# Patient Record
Sex: Female | Born: 1940
Health system: Southern US, Community
[De-identification: ages and names within clinical notes are randomized; demographics above are authoritative.]

## PROBLEM LIST (undated history)

## (undated) DIAGNOSIS — F419 Anxiety disorder, unspecified: Secondary | ICD-10-CM

## (undated) DIAGNOSIS — I10 Essential (primary) hypertension: Secondary | ICD-10-CM

## (undated) DIAGNOSIS — I4891 Unspecified atrial fibrillation: Secondary | ICD-10-CM

## (undated) DIAGNOSIS — F329 Major depressive disorder, single episode, unspecified: Secondary | ICD-10-CM

## (undated) DIAGNOSIS — E785 Hyperlipidemia, unspecified: Secondary | ICD-10-CM

## (undated) DIAGNOSIS — Z87898 Personal history of other specified conditions: Secondary | ICD-10-CM

## (undated) DIAGNOSIS — I739 Peripheral vascular disease, unspecified: Secondary | ICD-10-CM

## (undated) DIAGNOSIS — D649 Anemia, unspecified: Secondary | ICD-10-CM

## (undated) DIAGNOSIS — F32A Depression, unspecified: Secondary | ICD-10-CM

## (undated) DIAGNOSIS — I6521 Occlusion and stenosis of right carotid artery: Secondary | ICD-10-CM

## (undated) HISTORY — PX: SKIN CANCER EXCISION: SHX779

## (undated) HISTORY — DX: Hyperlipidemia, unspecified: E78.5

## (undated) HISTORY — DX: Unspecified atrial fibrillation: I48.91

## (undated) HISTORY — DX: Essential (primary) hypertension: I10

## (undated) HISTORY — PX: ANKLE ARTHODESIS W/ ARTHROSCOPY: SUR63

## (undated) HISTORY — DX: Occlusion and stenosis of right carotid artery: I65.21

## (undated) HISTORY — DX: Depression, unspecified: F32.A

## (undated) HISTORY — DX: Major depressive disorder, single episode, unspecified: F32.9

## (undated) HISTORY — DX: Peripheral vascular disease, unspecified: I73.9

## (undated) HISTORY — DX: Personal history of other specified conditions: Z87.898

## (undated) HISTORY — DX: Anemia, unspecified: D64.9

## (undated) HISTORY — DX: Anxiety disorder, unspecified: F41.9

---

## 1980-11-02 HISTORY — PX: ABDOMINAL HYSTERECTOMY: SHX81

## 1998-03-26 ENCOUNTER — Other Ambulatory Visit: Admission: RE | Admit: 1998-03-26 | Discharge: 1998-03-26 | Payer: Self-pay | Admitting: Obstetrics and Gynecology

## 1999-12-12 ENCOUNTER — Encounter: Admission: RE | Admit: 1999-12-12 | Discharge: 1999-12-12 | Payer: Self-pay | Admitting: Internal Medicine

## 1999-12-12 ENCOUNTER — Encounter: Payer: Self-pay | Admitting: Internal Medicine

## 2000-06-18 ENCOUNTER — Encounter: Admission: RE | Admit: 2000-06-18 | Discharge: 2000-06-18 | Payer: Self-pay | Admitting: Internal Medicine

## 2000-06-18 ENCOUNTER — Encounter: Payer: Self-pay | Admitting: Internal Medicine

## 2004-12-11 ENCOUNTER — Ambulatory Visit: Payer: Self-pay | Admitting: Internal Medicine

## 2005-04-07 ENCOUNTER — Encounter: Admission: RE | Admit: 2005-04-07 | Discharge: 2005-04-07 | Payer: Self-pay | Admitting: Internal Medicine

## 2005-04-07 ENCOUNTER — Encounter: Payer: Self-pay | Admitting: Internal Medicine

## 2006-06-25 ENCOUNTER — Ambulatory Visit: Payer: Self-pay | Admitting: Internal Medicine

## 2006-07-01 ENCOUNTER — Ambulatory Visit: Payer: Self-pay | Admitting: Internal Medicine

## 2006-08-24 ENCOUNTER — Ambulatory Visit: Payer: Self-pay | Admitting: Internal Medicine

## 2006-09-10 ENCOUNTER — Ambulatory Visit: Payer: Self-pay | Admitting: Internal Medicine

## 2006-09-10 ENCOUNTER — Encounter: Payer: Self-pay | Admitting: Internal Medicine

## 2007-09-19 ENCOUNTER — Encounter: Payer: Self-pay | Admitting: Internal Medicine

## 2007-11-07 ENCOUNTER — Telehealth: Payer: Self-pay | Admitting: Internal Medicine

## 2007-11-10 ENCOUNTER — Ambulatory Visit: Payer: Self-pay | Admitting: Internal Medicine

## 2007-11-10 LAB — CONVERTED CEMR LAB
ALT: 20 units/L (ref 0–35)
AST: 22 units/L (ref 0–37)
Albumin: 3.7 g/dL (ref 3.5–5.2)
Alkaline Phosphatase: 60 units/L (ref 39–117)
BUN: 12 mg/dL (ref 6–23)
Basophils Absolute: 0 10*3/uL (ref 0.0–0.1)
Basophils Relative: 0.5 % (ref 0.0–1.0)
Bilirubin Urine: NEGATIVE
Bilirubin, Direct: 0.1 mg/dL (ref 0.0–0.3)
CO2: 29 meq/L (ref 19–32)
Calcium: 9.2 mg/dL (ref 8.4–10.5)
Chloride: 94 meq/L — ABNORMAL LOW (ref 96–112)
Cholesterol: 268 mg/dL (ref 0–200)
Creatinine, Ser: 0.9 mg/dL (ref 0.4–1.2)
Direct LDL: 126.1 mg/dL
Eosinophils Absolute: 0.1 10*3/uL (ref 0.0–0.6)
Eosinophils Relative: 1.7 % (ref 0.0–5.0)
GFR calc Af Amer: 80 mL/min
GFR calc non Af Amer: 66 mL/min
Glucose, Bld: 92 mg/dL (ref 70–99)
Glucose, Urine, Semiquant: 100
HCT: 44.2 % (ref 36.0–46.0)
HDL: 41.1 mg/dL (ref >39.0)
Hemoglobin: 15.7 g/dL — ABNORMAL HIGH (ref 12.0–15.0)
Ketones, urine, test strip: NEGATIVE
Lymphocytes Relative: 27.3 % (ref 12.0–46.0)
MCHC: 35.5 g/dL (ref 30.0–36.0)
MCV: 90.8 fL (ref 78.0–100.0)
Monocytes Absolute: 0.5 10*3/uL (ref 0.2–0.7)
Monocytes Relative: 7.3 % (ref 3.0–11.0)
Neutro Abs: 4.3 10*3/uL (ref 1.4–7.7)
Neutrophils Relative %: 63.2 % (ref 43.0–77.0)
Nitrite: NEGATIVE
Platelets: 187 10*3/uL (ref 150–400)
Potassium: 4.3 meq/L (ref 3.5–5.1)
Protein, U semiquant: NEGATIVE
RBC: 4.87 M/uL (ref 3.87–5.11)
RDW: 12.5 % (ref 11.5–14.6)
Sodium: 138 meq/L (ref 135–145)
Specific Gravity, Urine: 1.025
TSH: 2.66 microintl units/mL (ref 0.35–5.50)
Total Bilirubin: 0.7 mg/dL (ref 0.3–1.2)
Total CHOL/HDL Ratio: 6.5
Total Protein: 7 g/dL (ref 6.0–8.3)
Triglycerides: 461 mg/dL (ref 0–149)
Urobilinogen, UA: 0.2
VLDL: 92 mg/dL — ABNORMAL HIGH (ref 0–40)
WBC Urine, dipstick: NEGATIVE
WBC: 6.7 10*3/uL (ref 4.5–10.5)
pH: 5.5

## 2007-11-17 ENCOUNTER — Telehealth: Payer: Self-pay | Admitting: Internal Medicine

## 2007-12-07 ENCOUNTER — Ambulatory Visit: Payer: Self-pay | Admitting: Internal Medicine

## 2007-12-07 DIAGNOSIS — F329 Major depressive disorder, single episode, unspecified: Secondary | ICD-10-CM | POA: Insufficient documentation

## 2007-12-07 DIAGNOSIS — E785 Hyperlipidemia, unspecified: Secondary | ICD-10-CM | POA: Insufficient documentation

## 2007-12-07 DIAGNOSIS — F411 Generalized anxiety disorder: Secondary | ICD-10-CM

## 2007-12-07 DIAGNOSIS — I10 Essential (primary) hypertension: Secondary | ICD-10-CM | POA: Insufficient documentation

## 2007-12-07 DIAGNOSIS — F419 Anxiety disorder, unspecified: Secondary | ICD-10-CM | POA: Insufficient documentation

## 2008-02-21 ENCOUNTER — Ambulatory Visit: Payer: Self-pay | Admitting: Internal Medicine

## 2008-02-21 DIAGNOSIS — M25569 Pain in unspecified knee: Secondary | ICD-10-CM | POA: Insufficient documentation

## 2008-05-08 ENCOUNTER — Telehealth: Payer: Self-pay | Admitting: Internal Medicine

## 2008-06-05 ENCOUNTER — Ambulatory Visit: Payer: Self-pay | Admitting: Internal Medicine

## 2008-08-06 ENCOUNTER — Ambulatory Visit: Payer: Self-pay | Admitting: Internal Medicine

## 2009-04-30 ENCOUNTER — Ambulatory Visit: Payer: Self-pay | Admitting: Internal Medicine

## 2009-08-22 ENCOUNTER — Ambulatory Visit: Payer: Self-pay | Admitting: Internal Medicine

## 2009-08-22 DIAGNOSIS — R109 Unspecified abdominal pain: Secondary | ICD-10-CM | POA: Insufficient documentation

## 2010-02-04 ENCOUNTER — Telehealth: Payer: Self-pay | Admitting: Internal Medicine

## 2010-02-12 ENCOUNTER — Ambulatory Visit: Payer: Self-pay | Admitting: Internal Medicine

## 2010-02-12 LAB — CONVERTED CEMR LAB
BUN: 13 mg/dL (ref 6–23)
Basophils Absolute: 0 10*3/uL (ref 0.0–0.1)
Basophils Relative: 0.2 % (ref 0.0–3.0)
CO2: 31 meq/L (ref 19–32)
Calcium: 9.2 mg/dL (ref 8.4–10.5)
Chloride: 100 meq/L (ref 96–112)
Cholesterol: 251 mg/dL — ABNORMAL HIGH (ref 0–200)
Creatinine, Ser: 0.9 mg/dL (ref 0.4–1.2)
Direct LDL: 89.9 mg/dL
Eosinophils Absolute: 0.1 10*3/uL (ref 0.0–0.7)
Eosinophils Relative: 1.7 % (ref 0.0–5.0)
GFR calc non Af Amer: 65.64 mL/min (ref >60)
Glucose, Bld: 94 mg/dL (ref 70–99)
HCT: 42.8 % (ref 36.0–46.0)
HDL: 47.3 mg/dL (ref >39.00)
Hemoglobin: 14.9 g/dL (ref 12.0–15.0)
Lymphocytes Relative: 27.2 % (ref 12.0–46.0)
Lymphs Abs: 1.5 10*3/uL (ref 0.7–4.0)
MCHC: 34.8 g/dL (ref 30.0–36.0)
MCV: 91.2 fL (ref 78.0–100.0)
Monocytes Absolute: 0.5 10*3/uL (ref 0.1–1.0)
Monocytes Relative: 8.4 % (ref 3.0–12.0)
Neutro Abs: 3.6 10*3/uL (ref 1.4–7.7)
Neutrophils Relative %: 62.5 % (ref 43.0–77.0)
Platelets: 170 10*3/uL (ref 150.0–400.0)
Potassium: 4.1 meq/L (ref 3.5–5.1)
RBC: 4.69 M/uL (ref 3.87–5.11)
RDW: 13.2 % (ref 11.5–14.6)
Sodium: 139 meq/L (ref 135–145)
Total CHOL/HDL Ratio: 5
Triglycerides: 618 mg/dL — ABNORMAL HIGH (ref 0.0–149.0)
VLDL: 123.6 mg/dL — ABNORMAL HIGH (ref 0.0–40.0)
Vit D, 25-Hydroxy: 36 ng/mL (ref 30–89)
WBC: 5.7 10*3/uL (ref 4.5–10.5)

## 2010-02-20 ENCOUNTER — Telehealth: Payer: Self-pay | Admitting: Internal Medicine

## 2010-04-04 ENCOUNTER — Telehealth: Payer: Self-pay | Admitting: Internal Medicine

## 2010-04-10 ENCOUNTER — Telehealth: Payer: Self-pay | Admitting: Internal Medicine

## 2010-10-03 ENCOUNTER — Telehealth: Payer: Self-pay | Admitting: Internal Medicine

## 2010-10-08 ENCOUNTER — Ambulatory Visit: Payer: Self-pay | Admitting: Internal Medicine

## 2010-10-10 ENCOUNTER — Encounter
Admission: RE | Admit: 2010-10-10 | Discharge: 2010-10-10 | Payer: Self-pay | Source: Home / Self Care | Attending: Internal Medicine | Admitting: Internal Medicine

## 2010-12-02 NOTE — Assessment & Plan Note (Signed)
Summary: MED CK / REFILL  // RS   Vital Signs:  Patient profile:   70 year old female Weight:      189 pounds Temp:     110 degrees F oral BP sitting:   110 / 68  (right arm) Cuff size:   regular  Vitals Entered By: Duard Brady LPN (October 08, 2010 10:06 AM) CC: medication review and refills Is Patient Diabetic? No   CC:  medication review and refills.  History of Present Illness: 70 year old patient who is seen today for follow-up.  She has a history of hypertension, dyslipidemia, with a normal LDL cholesterol and also a history of anxiety, depression.  She has done well today although has not been seen in over one year.  Laboratory studies were done last brain and were unremarkable.  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  She has lost a few pounds since her last visit here.  She does have some exogenous obesity.  Her anxiety, depression, has been stable.  Allergies (verified): No Known Drug Allergies  Past History:  Past Medical History: Reviewed history from 04/30/2009 and no changes required. Anxiety Depression Hypertension Hyperlipidemia  Past Surgical History: Reviewed history from 12/07/2007 and no changes required. gravida one, para one, abortus zero Hysterectomy  1982, history of abnormal Pap  Family History: Reviewed history from 12/07/2007 and no changes required. father died age 27, MI mother died age 7.  cerebrovascular disease  One brother two sisters, one with COPD  Social History: Reviewed history from 04/30/2009 and no changes required. retired  Charity fundraiser   Review of Systems  The patient denies anorexia, fever, weight loss, weight gain, vision loss, decreased hearing, hoarseness, chest pain, syncope, dyspnea on exertion, peripheral edema, prolonged cough, headaches, hemoptysis, abdominal pain, melena, hematochezia, severe indigestion/heartburn, hematuria, incontinence, genital sores, muscle weakness,  suspicious skin lesions, transient blindness, difficulty walking, depression, unusual weight change, abnormal bleeding, enlarged lymph nodes, angioedema, and breast masses.    Physical Exam  General:  overweight-appearing.  130/70overweight-appearing.   Head:  Normocephalic and atraumatic without obvious abnormalities. No apparent alopecia or balding. Eyes:  No corneal or conjunctival inflammation noted. EOMI. Perrla. Funduscopic exam benign, without hemorrhages, exudates or papilledema. Vision grossly normal. Ears:  External ear exam shows no significant lesions or deformities.  Otoscopic examination reveals clear canals, tympanic membranes are intact bilaterally without bulging, retraction, inflammation or discharge. Hearing is grossly normal bilaterally. Mouth:  Oral mucosa and oropharynx without lesions or exudates.  Teeth in good repair. Neck:  No deformities, masses, or tenderness noted. Chest Wall:  No deformities, masses, or tenderness noted. Lungs:  Normal respiratory effort, chest expands symmetrically. Lungs are clear to auscultation, no crackles or wheezes. Heart:  Normal rate and regular rhythm. S1 and S2 normal without gallop, murmur, click, rub or other extra sounds. Abdomen:  Bowel sounds positive,abdomen soft and non-tender without masses, organomegaly or hernias noted. Msk:  No deformity or scoliosis noted of thoracic or lumbar spine.   Pulses:  R and L carotid,radial,femoral,dorsalis pedis and posterior tibial pulses are full and equal bilaterally Extremities:  No clubbing, cyanosis, edema, or deformity noted with normal full range of motion of all joints.   Skin:  Intact without suspicious lesions or rashes Cervical Nodes:  No lymphadenopathy noted Axillary Nodes:  No palpable lymphadenopathy Inguinal Nodes:  No significant adenopathy   Impression & Recommendations:  Problem # 1:  HYPERLIPIDEMIA (ICD-272.4)  Problem # 2:  HYPERTENSION (  ICD-401.9)  Her updated  medication list for this problem includes:    Bisoprolol-hydrochlorothiazide 10-6.25 Mg Tabs (Bisoprolol-hydrochlorothiazide) ..... One daily  Her updated medication list for this problem includes:    Bisoprolol-hydrochlorothiazide 10-6.25 Mg Tabs (Bisoprolol-hydrochlorothiazide) ..... One daily  Problem # 3:  ANXIETY (ICD-300.00)  Her updated medication list for this problem includes:    Clorazepate Dipotassium 3.75 Mg Tabs (Clorazepate dipotassium) ..... Use once or twice daily as needed  Her updated medication list for this problem includes:    Clorazepate Dipotassium 3.75 Mg Tabs (Clorazepate dipotassium) ..... Use once or twice daily as needed  Complete Medication List: 1)  Clorazepate Dipotassium 3.75 Mg Tabs (Clorazepate dipotassium) .... Use once or twice daily as needed 2)  Meloxicam 15 Mg Tabs (Meloxicam) .... One daily 3)  Bisoprolol-hydrochlorothiazide 10-6.25 Mg Tabs (Bisoprolol-hydrochlorothiazide) .... One daily 4)  Omeprazole 20 Mg Cpdr (Omeprazole) .... One daily 5)  Tramadol Hcl 50 Mg Tabs (Tramadol hcl) .... One every 6 hours as needed for pain  Patient Instructions: 1)  Please schedule a follow-up appointment in 4 months. 2)  Limit your Sodium (Salt). 3)  It is important that you exercise regularly at least 20 minutes 5 times a week. If you develop chest pain, have severe difficulty breathing, or feel very tired , stop exercising immediately and seek medical attention. 4)  You need to lose weight. Consider a lower calorie diet and regular exercise.  5)  Schedule your mammogram. 6)  Take calcium +Vitamin D daily. 7)  Check your Blood Pressure regularly. If it is above:  150/90 you should make an appointment. Prescriptions: TRAMADOL HCL 50 MG TABS (TRAMADOL HCL) one every 6 hours as needed for pain  #90 x 4   Entered and Authorized by:   Gordy Savers  MD   Signed by:   Gordy Savers  MD on 10/08/2010   Method used:   Print then Give to Patient   RxID:    [id number] OMEPRAZOLE 20 MG CPDR (OMEPRAZOLE) one daily  #90 x 6   Entered and Authorized by:   Gordy Savers  MD   Signed by:   Gordy Savers  MD on 10/08/2010   Method used:   Print then Give to Patient   RxID:   [id number] BISOPROLOL-HYDROCHLOROTHIAZIDE 10-6.25 MG TABS (BISOPROLOL-HYDROCHLOROTHIAZIDE) one daily  #90 Tablet x 3   Entered and Authorized by:   Gordy Savers  MD   Signed by:   Gordy Savers  MD on 10/08/2010   Method used:   Print then Give to Patient   RxID:   [id number] MELOXICAM 15 MG  TABS (MELOXICAM) one daily  #90 x 6   Entered and Authorized by:   Gordy Savers  MD   Signed by:   Gordy Savers  MD on 10/08/2010   Method used:   Print then Give to Patient   RxID:   [id number] CLORAZEPATE DIPOTASSIUM 3.75 MG  TABS (CLORAZEPATE DIPOTASSIUM) use once or twice daily as needed  #90 x 2   Entered and Authorized by:   Gordy Savers  MD   Signed by:   Gordy Savers  MD on 10/08/2010   Method used:   Print then Give to Patient   RxID:   [id number]    Orders Added: 1)  Est. Patient Level IV [32355]  Appended Document: MED CK / REFILL  // RS     Allergies: No Known Drug Allergies   Complete Medication  List: 1)  Clorazepate Dipotassium 3.75 Mg Tabs (Clorazepate dipotassium) .... Use once or twice daily as needed 2)  Meloxicam 15 Mg Tabs (Meloxicam) .... One daily 3)  Bisoprolol-hydrochlorothiazide 10-6.25 Mg Tabs (Bisoprolol-hydrochlorothiazide) .... One daily 4)  Omeprazole 20 Mg Cpdr (Omeprazole) .... One daily 5)  Tramadol Hcl 50 Mg Tabs (Tramadol hcl) .... One every 6 hours as needed for pain  Other Orders: Pneumococcal Vaccine (09811) Admin 1st Vaccine (91478)   Orders Added: 1)  Pneumococcal Vaccine [90732] 2)  Admin 1st Vaccine [29562]   Immunizations Administered:  Pneumonia Vaccine:    Vaccine Type: Pneumovax    Site: right deltoid    Mfr: Merck    Dose: 0.5  ml    Route: IM    Given by: Duard Brady LPN    Exp. Date: 02/27/2012    Lot #: [id number]    VIS given: 10/07/09 version given October 08, 2010.    Physician counseled: yes   Immunizations Administered:  Pneumonia Vaccine:    Vaccine Type: Pneumovax    Site: right deltoid    Mfr: Merck    Dose: 0.5 ml    Route: IM    Given by: Duard Brady LPN    Exp. Date: 02/27/2012    Lot #: [id number]    VIS given: 10/07/09 version given October 08, 2010.    Physician counseled: yes

## 2010-12-02 NOTE — Progress Notes (Signed)
Summary: refill clorazepate  Phone Note Refill Request Message from:  Fax from Pharmacy on April 04, 2010 1:40 PM  Refills Requested: Medication #1:  CLORAZEPATE DIPOTASSIUM 3.75 MG  TABS use once or twice daily as needed   Last Ref[facility]: [facility] [address]    [phone]   Method Requested: Fax to Local Pharmacy Initial call taken by: Duard Brady LPN,  April 04, 1609 1:41 PM    Prescriptions: CLORAZEPATE DIPOTASSIUM 3.75 MG  TABS (CLORAZEPATE DIPOTASSIUM) use once or twice daily as needed  #90 x 2   Entered and Authorized by:   Duard Brady LPN   Signed by:   Duard Brady LPN on 96/02/5408   Method used:   Historical   RxID:   [id number]  [facility]   KIK

## 2010-12-02 NOTE — Progress Notes (Signed)
Summary: shingles injection and labs  Phone Note Call from Patient   Caller: Patient Call For: Gordy Savers  MD Summary of Call: Pt. would like a shingles injection. [phone] Initial call taken by: Lynann Beaver CMA,  February 04, 2010 1:29 PM  Follow-up for Phone Call        spoke with pt - will wait till next week to see if Dr. Kirtland Bouchard approves. informed her to check with ins - cost $290 , and if they dont pay - she will have to cover the cost. Verbalized understanding. Also pt requested lab orders. KIK Follow-up by: Duard Brady LPN,  February 04, 2010 2:55 PM  Additional Follow-up for Phone Call Additional follow up Details #1::        OK Additional Follow-up by: Gordy Savers  MD,  February 10, 2010 6:56 AM    Additional Follow-up for Phone Call Additional follow up Details #2::    spoke with pt - ok to do shot - and labs - lipds, cbc , basic metabolic , vit d , transffred to scheduling to make appt. KIK Follow-up by: Duard Brady LPN,  February 10, 2010 10:14 AM

## 2010-12-02 NOTE — Progress Notes (Signed)
Summary: shingles vaccine prescription  Phone Note Call from Patient   Caller: Patient Call For: Gordy Savers  MD Summary of Call: Pt. needs a written order for the shingles vaccine to take to [facility].  Please call when she can pick up. [phone] Initial call taken by: Lynann Beaver CMA,  April 10, 2010 10:28 AM  Follow-up for Phone Call        ok Follow-up by: Gordy Savers  MD,  April 10, 2010 12:54 PM  Additional Follow-up for Phone Call Additional follow up Details #1::        rx done and pt request to mail to home address  KIK Additional Follow-up by: Duard Brady LPN,  April 10, 7370 3:06 PM

## 2010-12-02 NOTE — Progress Notes (Signed)
Summary: lab results  Phone Note Call from Patient   Caller: Patient Call For: Tammy Savers  MD Summary of Call: Pt wants lab results and to talk to [facility] about Shingles injection. [phone] Initial call taken by: Lynann Beaver CMA,  February 20, 2010 1:28 PM  Follow-up for Phone Call        D.r Amador Cunas to advise on lab results . KIK Follow-up by: Duard Brady LPN,  February 20, 2010 1:54 PM  Additional Follow-up for Phone Call Additional follow up Details #1::        all OK except elevated Cholesterol-LDL chol is low; encourage diet and weight loss; needs Vit D 1200 units daily Additional Follow-up by: Tammy Savers  MD,  February 20, 2010 2:13 PM    Additional Follow-up for Phone Call Additional follow up Details #2::    spoke with pt r/t labs and shingle vaccine - will call when avilb. KIK Follow-up by: Duard Brady LPN,  February 20, 2010 3:32 PM

## 2010-12-02 NOTE — Progress Notes (Signed)
Summary: refill clorazepate  Phone Note Refill Request Message from:  Fax from Pharmacy on October 03, 2010 12:04 PM  Refills Requested: Medication #1:  CLORAZEPATE DIPOTASSIUM 3.75 MG  TABS use once or twice daily as needed   Last Ref[facility]: [facility] [address] -    Method Requested: Fax to Local Pharmacy Initial call taken by: Duard Brady LPN,  October 03, 2010 12:05 PM    Prescriptions: CLORAZEPATE DIPOTASSIUM 3.75 MG  TABS (CLORAZEPATE DIPOTASSIUM) use once or twice daily as needed  #30 x 0   Entered by:   Duard Brady LPN   Authorized by:   Gordy Savers  MD   Signed by:   Duard Brady LPN on 04/54/0981   Method used:   Historical   RxID:   [id number]  faxed back to [facility]

## 2011-03-20 NOTE — Assessment & Plan Note (Signed)
[facility]                              [facility] OFFICE NOTE   BRITTAIN, SMITHEY                    MRN:          [id number]  DATE:07/01/2006                            DOB:          1941/09/11    A 70 year old female, seen today for a wellness exam.  She has a long  history of hypertension.  She also has a history of depression, seasonal  allergic rhinitis, and anxiety disorder.  She is doing quite well.  She has  had a prior hysterectomy, is gravida 1, para 1, abortive 0.   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  Husband  deceased from prostate cancer.   FAMILY HISTORY:  Father died at 65 of an myocardial infarction.  Mother died  in her mid-80s of a stroke.  One brother is well.  One sister died of  complications of COPD.   PHYSICAL EXAMINATION:  GENERAL:  Revealed an overweight, white female.  No  acute distress.  VITAL SIGNS:  Blood pressure is 124/80.  FUNDI, EAR, NOSE, AND THROAT:  Clear.  NECK:  No bruits or adenopathy.  CHEST:  Was clear.  BREASTS:  Negative.  CARDIOVASCULAR:  Normal heart sounds.  No murmurs.  ABDOMEN:  Obese, soft, and nontender.  No organomegaly.  PELVIC AND PAP:  Declined.  EXTREMITIES:  No edema.  Peripheral pulses intact.  NEURO:  Negative.   IMPRESSION:  1. Hypertension  2. Obesity.  3. Anxiety depression.  4. Degenerative disc disease.  5. Symptomatic left knee pain.   DISPOSITION:  Weight loss strongly encourage.  Her medical regimen will be  unchanged.  A colonoscopy will also be set up.  This has been encouraged in  the past.                                   Gordy Savers, MD   PFK/MedQ  DD:  07/01/2006  DT:  07/02/2006  Job #:  [id number]

## 2011-04-01 ENCOUNTER — Other Ambulatory Visit: Payer: Self-pay

## 2011-04-01 MED ORDER — CLORAZEPATE DIPOTASSIUM 3.75 MG PO TABS: 3.7500 mg | ORAL_TABLET | Freq: Two times a day (BID) | ORAL | Status: AC | PRN

## 2011-04-01 NOTE — Telephone Encounter (Signed)
[facility] 

## 2011-07-14 ENCOUNTER — Other Ambulatory Visit: Payer: Self-pay | Admitting: Internal Medicine

## 2011-10-21 ENCOUNTER — Other Ambulatory Visit: Payer: Self-pay | Admitting: Internal Medicine

## 2011-10-23 ENCOUNTER — Other Ambulatory Visit: Payer: Self-pay

## 2011-10-23 MED ORDER — CLORAZEPATE DIPOTASSIUM 3.75 MG PO TABS: 3.7500 mg | ORAL_TABLET | Freq: Two times a day (BID) | ORAL | Status: AC | PRN

## 2011-10-23 NOTE — Telephone Encounter (Signed)
#  90  Needs ROV prior to any more RF

## 2011-10-23 NOTE — Telephone Encounter (Signed)
[facility]

## 2011-10-23 NOTE — Telephone Encounter (Signed)
Last OV 10/08/10. Last fill date 07/13/11. No pending appts

## 2011-11-09 ENCOUNTER — Other Ambulatory Visit: Payer: Self-pay | Admitting: Internal Medicine

## 2011-12-11 ENCOUNTER — Encounter: Payer: Self-pay | Admitting: Internal Medicine

## 2011-12-14 ENCOUNTER — Encounter: Payer: Self-pay | Admitting: Internal Medicine

## 2011-12-14 ENCOUNTER — Ambulatory Visit (INDEPENDENT_AMBULATORY_CARE_PROVIDER_SITE_OTHER): Payer: Medicare Other | Admitting: Internal Medicine

## 2011-12-14 DIAGNOSIS — I10 Essential (primary) hypertension: Secondary | ICD-10-CM

## 2011-12-14 DIAGNOSIS — E785 Hyperlipidemia, unspecified: Secondary | ICD-10-CM

## 2011-12-14 DIAGNOSIS — F411 Generalized anxiety disorder: Secondary | ICD-10-CM

## 2011-12-14 LAB — COMPREHENSIVE METABOLIC PANEL WITH GFR
ALT: 17 U/L (ref 0–35)
AST: 21 U/L (ref 0–37)
Albumin: 3.7 g/dL (ref 3.5–5.2)
Alkaline Phosphatase: 41 U/L (ref 39–117)
BUN: 20 mg/dL (ref 6–23)
CO2: 29 meq/L (ref 19–32)
Calcium: 9 mg/dL (ref 8.4–10.5)
Chloride: 100 meq/L (ref 96–112)
Creatinine, Ser: 0.9 mg/dL (ref 0.4–1.2)
GFR: 67.01 mL/min (ref >60.00)
Glucose, Bld: 83 mg/dL (ref 70–99)
Potassium: 4 meq/L (ref 3.5–5.1)
Sodium: 136 meq/L (ref 135–145)
Total Bilirubin: 0.7 mg/dL (ref 0.3–1.2)
Total Protein: 7.1 g/dL (ref 6.0–8.3)

## 2011-12-14 LAB — CBC WITH DIFFERENTIAL/PLATELET
Basophils Absolute: 0 K/uL (ref 0.0–0.1)
Basophils Relative: 0.2 % (ref 0.0–3.0)
Eosinophils Absolute: 0.1 K/uL (ref 0.0–0.7)
Eosinophils Relative: 1.1 % (ref 0.0–5.0)
HCT: 43.3 % (ref 36.0–46.0)
Hemoglobin: 15 g/dL (ref 12.0–15.0)
Lymphocytes Relative: 24.5 % (ref 12.0–46.0)
Lymphs Abs: 1.7 K/uL (ref 0.7–4.0)
MCHC: 34.8 g/dL (ref 30.0–36.0)
MCV: 91.6 fl (ref 78.0–100.0)
Monocytes Absolute: 0.5 K/uL (ref 0.1–1.0)
Monocytes Relative: 7.4 % (ref 3.0–12.0)
Neutro Abs: 4.6 K/uL (ref 1.4–7.7)
Neutrophils Relative %: 66.8 % (ref 43.0–77.0)
Platelets: 174 K/uL (ref 150.0–400.0)
RBC: 4.72 Mil/uL (ref 3.87–5.11)
RDW: 13 % (ref 11.5–14.6)
WBC: 6.9 K/uL (ref 4.5–10.5)

## 2011-12-14 LAB — LIPID PANEL: Total CHOL/HDL Ratio: 5

## 2011-12-14 LAB — LDL CHOLESTEROL, DIRECT: Direct LDL: 147.2 mg/dL

## 2011-12-14 MED ORDER — OMEPRAZOLE 20 MG PO CPDR: 20.0000 mg | DELAYED_RELEASE_CAPSULE | Freq: Every day | ORAL | Status: DC

## 2011-12-14 MED ORDER — MELOXICAM 15 MG PO TABS: 15.0000 mg | ORAL_TABLET | Freq: Every day | ORAL | Status: DC

## 2011-12-14 MED ORDER — TRAMADOL HCL 50 MG PO TABS: 50.0000 mg | ORAL_TABLET | Freq: Four times a day (QID) | ORAL | Status: DC | PRN

## 2011-12-14 MED ORDER — BISOPROLOL-HYDROCHLOROTHIAZIDE 10-6.25 MG PO TABS: 1.0000 | ORAL_TABLET | Freq: Every day | ORAL | Status: DC

## 2011-12-14 NOTE — Patient Instructions (Signed)
Limit your sodium (Salt) intake  You need to lose weight.  Consider a lower calorie diet and regular exercise.  Please check your blood pressure on a regular basis.  If it is consistently greater than 150/90, please make an office appointment.    It is important that you exercise regularly, at least 20 minutes 3 to 4 times per week.  If you develop chest pain or shortness of breath seek  medical attention.  Return in 6 months for follow-up  

## 2011-12-14 NOTE — Progress Notes (Signed)
  Subjective:    Patient ID: Tammy Barron, female    DOB: [birth year], 71 y.o.   MRN: [id number]  HPI  71 year old patient who is seen today for followup of her treated hypertension. She states a watch or twice a month she feels a bit flushed and does check her blood pressure was slightly high readings. Blood pressure today is the low normal range. She has gastro-esophageal reflux disease and a history of mild anxiety disorder. She states that she feels quite well and is"stress-free"   Review of Systems  Constitutional: Negative.   HENT: Negative for hearing loss, congestion, sore throat, rhinorrhea, dental problem, sinus pressure and tinnitus.   Eyes: Negative for pain, discharge and visual disturbance.  Respiratory: Negative for cough and shortness of breath.   Cardiovascular: Negative for chest pain, palpitations and leg swelling.  Gastrointestinal: Negative for nausea, vomiting, abdominal pain, diarrhea, constipation, blood in stool and abdominal distention.  Genitourinary: Negative for dysuria, urgency, frequency, hematuria, flank pain, vaginal bleeding, vaginal discharge, difficulty urinating, vaginal pain and pelvic pain.  Musculoskeletal: Negative for joint swelling, arthralgias and gait problem.  Skin: Negative for rash.  Neurological: Negative for dizziness, syncope, speech difficulty, weakness, numbness and headaches.  Hematological: Negative for adenopathy.  Psychiatric/Behavioral: Negative for behavioral problems, dysphoric mood and agitation. The patient is not nervous/anxious.        Objective:   Physical Exam  Constitutional: She is oriented to person, place, and time. She appears well-developed and well-nourished.  HENT:  Head: Normocephalic.  Right Ear: External ear normal.  Left Ear: External ear normal.  Mouth/Throat: Oropharynx is clear and moist.  Eyes: Conjunctivae and EOM are normal. Pupils are equal, round, and reactive to light.  Neck: Normal range of  motion. Neck supple. No thyromegaly present.  Cardiovascular: Normal rate, regular rhythm, normal heart sounds and intact distal pulses.   Pulmonary/Chest: Effort normal and breath sounds normal.  Abdominal: Soft. Bowel sounds are normal. She exhibits no mass. There is no tenderness.  Musculoskeletal: Normal range of motion.  Lymphadenopathy:    She has no cervical adenopathy.  Neurological: She is alert and oriented to person, place, and time.  Skin: Skin is warm and dry. No rash noted.  Psychiatric: She has a normal mood and affect. Her behavior is normal.          Assessment & Plan:    Hypertension well controlled. Osteoarthritis stable gastroesophageal reflux disease stable We'll recheck in 6 months for an annual exam. Medications refilled We'll consider a tapering of transient

## 2011-12-22 ENCOUNTER — Other Ambulatory Visit: Payer: Self-pay

## 2011-12-22 MED ORDER — CLORAZEPATE DIPOTASSIUM 3.75 MG PO TABS: 3.7500 mg | ORAL_TABLET | Freq: Two times a day (BID) | ORAL | Status: DC | PRN

## 2012-03-04 ENCOUNTER — Other Ambulatory Visit: Payer: Self-pay

## 2012-03-04 MED ORDER — CLORAZEPATE DIPOTASSIUM 3.75 MG PO TABS: 3.7500 mg | ORAL_TABLET | Freq: Two times a day (BID) | ORAL | Status: DC | PRN

## 2012-05-30 ENCOUNTER — Other Ambulatory Visit: Payer: Self-pay

## 2012-05-30 MED ORDER — CLORAZEPATE DIPOTASSIUM 3.75 MG PO TABS: 3.7500 mg | ORAL_TABLET | Freq: Two times a day (BID) | ORAL | Status: DC | PRN

## 2012-07-11 ENCOUNTER — Other Ambulatory Visit: Payer: Self-pay

## 2012-07-11 MED ORDER — CLORAZEPATE DIPOTASSIUM 3.75 MG PO TABS: 3.7500 mg | ORAL_TABLET | Freq: Two times a day (BID) | ORAL | Status: DC | PRN

## 2012-07-11 NOTE — Telephone Encounter (Signed)
[facility] 

## 2012-08-22 ENCOUNTER — Other Ambulatory Visit: Payer: Self-pay | Admitting: Internal Medicine

## 2012-10-03 ENCOUNTER — Other Ambulatory Visit: Payer: Self-pay | Admitting: Internal Medicine

## 2012-11-23 ENCOUNTER — Other Ambulatory Visit: Payer: Self-pay | Admitting: Internal Medicine

## 2013-01-04 ENCOUNTER — Other Ambulatory Visit: Payer: Self-pay | Admitting: Internal Medicine

## 2013-02-17 ENCOUNTER — Other Ambulatory Visit: Payer: Self-pay | Admitting: Internal Medicine

## 2013-03-14 ENCOUNTER — Other Ambulatory Visit: Payer: Self-pay | Admitting: Internal Medicine

## 2013-03-29 ENCOUNTER — Other Ambulatory Visit: Payer: Self-pay | Admitting: Internal Medicine

## 2013-04-01 ENCOUNTER — Other Ambulatory Visit: Payer: Self-pay | Admitting: Internal Medicine

## 2013-05-16 ENCOUNTER — Ambulatory Visit (INDEPENDENT_AMBULATORY_CARE_PROVIDER_SITE_OTHER): Payer: Medicare Other | Admitting: Internal Medicine

## 2013-05-16 ENCOUNTER — Encounter: Payer: Self-pay | Admitting: Internal Medicine

## 2013-05-16 VITALS — BP 140/80 | HR 64 | Temp 97.7°F | Resp 20 | Ht 63.0 in | Wt 190.0 lb

## 2013-05-16 DIAGNOSIS — Z0000 Encounter for general adult medical examination without abnormal findings: Secondary | ICD-10-CM

## 2013-05-16 DIAGNOSIS — I10 Essential (primary) hypertension: Secondary | ICD-10-CM

## 2013-05-16 DIAGNOSIS — F411 Generalized anxiety disorder: Secondary | ICD-10-CM

## 2013-05-16 DIAGNOSIS — E785 Hyperlipidemia, unspecified: Secondary | ICD-10-CM

## 2013-05-16 LAB — CBC WITH DIFFERENTIAL/PLATELET
Basophils Absolute: 0 10*3/uL (ref 0.0–0.1)
Eosinophils Absolute: 0.1 10*3/uL (ref 0.0–0.7)
Eosinophils Relative: 1.6 % (ref 0.0–5.0)
Hemoglobin: 14.9 g/dL (ref 12.0–15.0)
Lymphocytes Relative: 31 % (ref 12.0–46.0)
MCHC: 34 g/dL (ref 30.0–36.0)
Monocytes Relative: 7.9 % (ref 3.0–12.0)
Neutro Abs: 4.4 10*3/uL (ref 1.4–7.7)
Platelets: 172 10*3/uL (ref 150.0–400.0)
RDW: 12.9 % (ref 11.5–14.6)
WBC: 7.4 10*3/uL (ref 4.5–10.5)

## 2013-05-16 LAB — COMPREHENSIVE METABOLIC PANEL
ALT: 15 U/L (ref 0–35)
CO2: 30 mEq/L (ref 19–32)
Calcium: 9.9 mg/dL (ref 8.4–10.5)
Chloride: 101 mEq/L (ref 96–112)
Creatinine, Ser: 0.8 mg/dL (ref 0.4–1.2)
GFR: 74.51 mL/min (ref >60.00)
Glucose, Bld: 88 mg/dL (ref 70–99)
Total Protein: 7.2 g/dL (ref 6.0–8.3)

## 2013-05-16 LAB — TSH: TSH: 2.24 u[IU]/mL (ref 0.35–5.50)

## 2013-05-16 LAB — LIPID PANEL
Cholesterol: 209 mg/dL — ABNORMAL HIGH (ref 0–200)
Total CHOL/HDL Ratio: 5
Triglycerides: 317 mg/dL — ABNORMAL HIGH (ref 0.0–149.0)

## 2013-05-16 LAB — LDL CHOLESTEROL, DIRECT: Direct LDL: 108.2 mg/dL

## 2013-05-16 MED ORDER — CLORAZEPATE DIPOTASSIUM 3.75 MG PO TABS: ORAL_TABLET | ORAL | Status: DC

## 2013-05-16 MED ORDER — OMEPRAZOLE 20 MG PO CPDR: 20.0000 mg | DELAYED_RELEASE_CAPSULE | Freq: Every day | ORAL | Status: DC

## 2013-05-16 MED ORDER — BISOPROLOL-HYDROCHLOROTHIAZIDE 10-6.25 MG PO TABS: ORAL_TABLET | ORAL | Status: DC

## 2013-05-16 NOTE — Patient Instructions (Signed)
Limit your sodium (Salt) intake    It is important that you exercise regularly, at least 20 minutes 3 to 4 times per week.  If you develop chest pain or shortness of breath seek  medical attention.  You need to lose weight.  Consider a lower calorie diet and regular exercise.  Please check your blood pressure on a regular basis.  If it is consistently greater than 150/90, please make an office appointment.  Take a calcium supplement, plus 800-1200 units of vitamin D 

## 2013-05-16 NOTE — Progress Notes (Signed)
Subjective:    Patient ID: Tammy Barron, female    DOB: [birth year], 72 y.o.   MRN: [id number]  HPI  72 year old patient who is seen today for a preventive health examination. Medical problems include hypertension mild dyslipidemia and a history of chronic anxiety. She is doing quite well today without any concerns or complaints. She did have screening colonoscopy in 2007   Allergies (verified):  No Known Drug Allergies   Past History:  Past Medical History:  Reviewed history from 04/30/2009 and no changes required.  Anxiety  Depression  Hypertension  Hyperlipidemia   Past Surgical History:  Reviewed history from 12/07/2007 and no changes required.  gravida one, para one, abortus zero  Hysterectomy 1982, history of abnormal Pap   Family History:  Reviewed history from 12/07/2007 and no changes required.  father died age 59, MI  mother died age 42. cerebrovascular disease  One brother  two sisters, one  died due to metastatic cancer. The other died status post stroke  Social History:  Reviewed history from 04/30/2009 and no changes required.  retired Charity fundraiser   Past Medical History  Diagnosis Date  . Anxiety   . Depression   . Hypertension   . Hyperlipidemia   . Hx of abnormal Pap smear     History   Social History  . Marital Status: Widowed    Spouse Name: N/A    Number of Children: N/A  . Years of Education: N/A   Occupational History  . Not on file.   Social History Main Topics  . Smoking status: Current Some Day Smoker    Types: Cigarettes  . Smokeless tobacco: Never Used  . Alcohol Use: No  . Drug Use: Not on file  . Sexually Active: Not on file   Other Topics Concern  . Not on file   Social History Narrative  . No narrative on file    Past Surgical History  Procedure Laterality Date  . Abdominal hysterectomy  1982    Family History  Problem Relation Age of Onset  . Heart disease Mother   . Heart attack Father     Allergies  Allergen  Reactions  . Latex Rash    No current outpatient prescriptions on file prior to visit.   No current facility-administered medications on file prior to visit.    BP 140/80  Pulse 64  Temp(Src) 97.7 F (36.5 C) (Oral)  Resp 20  Ht 5\' 3"  (1.6 m)  Wt 190 lb (86.183 kg)  BMI 33.67 kg/m2  SpO2 95%   1. Risk factors, based on past  M,S,F history- cardiovascular risk factors include a history of hypertension and mild dyslipidemia  2.  Physical activities:  Participates at [employer] 3 times per week  3.  Depression/mood: Mild history of depression and chronic anxiety  4.  Hearing: No deficits  5.  ADL's: Independent in all aspects of daily living  6.  Fall risk: Low 7.  Home safety: No problems identified  8.  Height weight, and visual acuity; height and weight stable no change in visual acuity  9.  Counseling: We'll regular exercise weight loss encouraged  10. Lab orders based on risk factors: Laboratory profile with lipid panel will be reviewed  11. Referral : Not appropriate at this time  12. Care plan: We'll regular exercise weight loss heart healthy diet all encouraged  13. Cognitive assessment: Alert in order with normal affect. No cognitive dysfunction.      Review of Systems  Constitutional: Negative for fever, appetite change, fatigue and unexpected weight change.  HENT: Negative for hearing loss, ear pain, nosebleeds, congestion, sore throat, mouth sores, trouble swallowing, neck stiffness, dental problem, voice change, sinus pressure and tinnitus.   Eyes: Negative for photophobia, pain, redness and visual disturbance.  Respiratory: Negative for cough, chest tightness and shortness of breath.   Cardiovascular: Negative for chest pain, palpitations and leg swelling.  Gastrointestinal: Negative for nausea, vomiting, abdominal pain, diarrhea, constipation, blood in stool, abdominal distention and rectal pain.  Genitourinary: Negative for dysuria, urgency,  frequency, hematuria, flank pain, vaginal bleeding, vaginal discharge, difficulty urinating, genital sores, vaginal pain, menstrual problem and pelvic pain.  Musculoskeletal: Negative for back pain and arthralgias.  Skin: Negative for rash.  Neurological: Negative for dizziness, syncope, speech difficulty, weakness, light-headedness, numbness and headaches.  Hematological: Negative for adenopathy. Does not bruise/bleed easily.  Psychiatric/Behavioral: Negative for suicidal ideas, behavioral problems, self-injury, dysphoric mood and agitation. The patient is nervous/anxious.        Objective:   Physical Exam  Constitutional: She is oriented to person, place, and time. She appears well-developed and well-nourished.  HENT:  Head: Normocephalic and atraumatic.  Right Ear: External ear normal.  Left Ear: External ear normal.  Mouth/Throat: Oropharynx is clear and moist.  Eyes: Conjunctivae and EOM are normal.  Neck: Normal range of motion. Neck supple. No JVD present. No thyromegaly present.  Cardiovascular: Normal rate, regular rhythm, normal heart sounds and intact distal pulses.   No murmur heard. Pedal pulses not easily palpable  Pulmonary/Chest: Effort normal and breath sounds normal. She has no wheezes. She has no rales.  Abdominal: Soft. Bowel sounds are normal. She exhibits no distension and no mass. There is no tenderness. There is no rebound and no guarding.  Musculoskeletal: Normal range of motion. She exhibits no edema and no tenderness.  Neurological: She is alert and oriented to person, place, and time. She has normal reflexes. No cranial nerve deficit. She exhibits normal muscle tone. Coordination normal.  Skin: Skin is warm and dry. No rash noted.  Psychiatric: She has a normal mood and affect. Her behavior is normal.          Assessment & Plan:  Preventive health examination Hypertension well controlled History mild dyslipidemia. We'll check a lipid panel Anxiety  disorder. Continue when necessary Tranxene  Recheck 1 year Home blood pressure monitoring encouraged We'll regular exercise and weight loss recommended

## 2013-05-18 ENCOUNTER — Telehealth: Payer: Self-pay | Admitting: Internal Medicine

## 2013-05-18 NOTE — Telephone Encounter (Signed)
Pt states she got filled the clorazepate (TRANXENE) 3.75 MG tablet. On her bottle it states it is 7.5 mg. Pt thinks they have given her a 7.5 pill. Advised pt to also call pharm because her RX states 3.75. Pt would like you to give her a call.

## 2013-05-18 NOTE — Telephone Encounter (Signed)
Spoke to pt told her need to take back to pharmacy for correct dose 3.75 mg Pt stated she called the pharmacy and they said to bring the medication back and they will give her the correct dose. Told her okay.

## 2013-09-07 ENCOUNTER — Other Ambulatory Visit: Payer: Self-pay

## 2013-10-24 ENCOUNTER — Other Ambulatory Visit: Payer: Self-pay | Admitting: Internal Medicine

## 2014-01-18 ENCOUNTER — Other Ambulatory Visit: Payer: Self-pay | Admitting: Internal Medicine

## 2014-01-29 ENCOUNTER — Other Ambulatory Visit: Payer: Self-pay | Admitting: Internal Medicine

## 2014-01-31 ENCOUNTER — Emergency Department (HOSPITAL_COMMUNITY): Payer: Medicare Other

## 2014-01-31 ENCOUNTER — Encounter (HOSPITAL_COMMUNITY): Payer: Self-pay | Admitting: Radiology

## 2014-01-31 ENCOUNTER — Inpatient Hospital Stay (HOSPITAL_COMMUNITY): Payer: Medicare Other

## 2014-01-31 ENCOUNTER — Inpatient Hospital Stay (HOSPITAL_COMMUNITY)
Admission: EM | Admit: 2014-01-31 | Discharge: 2014-02-06 | DRG: 064 | Disposition: A | Discharge status: Rehabilitation Facility | Payer: Medicare Other | Attending: Internal Medicine | Admitting: Internal Medicine

## 2014-01-31 DIAGNOSIS — E870 Hyperosmolality and hypernatremia: Secondary | ICD-10-CM | POA: Diagnosis not present

## 2014-01-31 DIAGNOSIS — H532 Diplopia: Secondary | ICD-10-CM | POA: Diagnosis present

## 2014-01-31 DIAGNOSIS — G911 Obstructive hydrocephalus: Secondary | ICD-10-CM | POA: Diagnosis not present

## 2014-01-31 DIAGNOSIS — I4891 Unspecified atrial fibrillation: Secondary | ICD-10-CM

## 2014-01-31 DIAGNOSIS — I639 Cerebral infarction, unspecified: Secondary | ICD-10-CM

## 2014-01-31 DIAGNOSIS — F17200 Nicotine dependence, unspecified, uncomplicated: Secondary | ICD-10-CM | POA: Diagnosis present

## 2014-01-31 DIAGNOSIS — I480 Paroxysmal atrial fibrillation: Secondary | ICD-10-CM | POA: Diagnosis not present

## 2014-01-31 DIAGNOSIS — F3289 Other specified depressive episodes: Secondary | ICD-10-CM

## 2014-01-31 DIAGNOSIS — E8779 Other fluid overload: Secondary | ICD-10-CM | POA: Diagnosis present

## 2014-01-31 DIAGNOSIS — I6350 Cerebral infarction due to unspecified occlusion or stenosis of unspecified cerebral artery: Principal | ICD-10-CM | POA: Diagnosis present

## 2014-01-31 DIAGNOSIS — G936 Cerebral edema: Secondary | ICD-10-CM | POA: Diagnosis present

## 2014-01-31 DIAGNOSIS — Z79899 Other long term (current) drug therapy: Secondary | ICD-10-CM

## 2014-01-31 DIAGNOSIS — I6529 Occlusion and stenosis of unspecified carotid artery: Secondary | ICD-10-CM | POA: Diagnosis present

## 2014-01-31 DIAGNOSIS — I10 Essential (primary) hypertension: Secondary | ICD-10-CM | POA: Diagnosis present

## 2014-01-31 DIAGNOSIS — R109 Unspecified abdominal pain: Secondary | ICD-10-CM

## 2014-01-31 DIAGNOSIS — E871 Hypo-osmolality and hyponatremia: Secondary | ICD-10-CM | POA: Diagnosis present

## 2014-01-31 DIAGNOSIS — M25569 Pain in unspecified knee: Secondary | ICD-10-CM

## 2014-01-31 DIAGNOSIS — Z66 Do not resuscitate: Secondary | ICD-10-CM | POA: Diagnosis present

## 2014-01-31 DIAGNOSIS — F329 Major depressive disorder, single episode, unspecified: Secondary | ICD-10-CM

## 2014-01-31 DIAGNOSIS — E876 Hypokalemia: Secondary | ICD-10-CM | POA: Diagnosis present

## 2014-01-31 DIAGNOSIS — Z6833 Body mass index (BMI) 33.0-33.9, adult: Secondary | ICD-10-CM

## 2014-01-31 DIAGNOSIS — F419 Anxiety disorder, unspecified: Secondary | ICD-10-CM | POA: Diagnosis present

## 2014-01-31 DIAGNOSIS — F411 Generalized anxiety disorder: Secondary | ICD-10-CM

## 2014-01-31 DIAGNOSIS — E669 Obesity, unspecified: Secondary | ICD-10-CM | POA: Diagnosis present

## 2014-01-31 DIAGNOSIS — E785 Hyperlipidemia, unspecified: Secondary | ICD-10-CM | POA: Diagnosis present

## 2014-01-31 LAB — CREATININE, SERUM
CREATININE: 0.66 mg/dL (ref 0.50–1.10)
GFR calc Af Amer: >90 mL/min (ref >90)
GFR calc non Af Amer: 85 mL/min — ABNORMAL LOW (ref >90)

## 2014-01-31 LAB — COMPREHENSIVE METABOLIC PANEL
ALBUMIN: 3.6 g/dL (ref 3.5–5.2)
ALK PHOS: 52 U/L (ref 39–117)
ALT: 12 U/L (ref 0–35)
AST: 20 U/L (ref 0–37)
BUN: 29 mg/dL — AB (ref 6–23)
CHLORIDE: 94 meq/L — AB (ref 96–112)
CO2: 24 mEq/L (ref 19–32)
Calcium: 9.8 mg/dL (ref 8.4–10.5)
Creatinine, Ser: 0.69 mg/dL (ref 0.50–1.10)
GFR calc Af Amer: >90 mL/min (ref >90)
GFR calc non Af Amer: 84 mL/min — ABNORMAL LOW (ref >90)
Glucose, Bld: 171 mg/dL — ABNORMAL HIGH (ref 70–99)
POTASSIUM: 3.4 meq/L — AB (ref 3.7–5.3)
Sodium: 136 mEq/L — ABNORMAL LOW (ref 137–147)
Total Bilirubin: 0.5 mg/dL (ref 0.3–1.2)
Total Protein: 7.6 g/dL (ref 6.0–8.3)

## 2014-01-31 LAB — CBC WITH DIFFERENTIAL/PLATELET
BASOS ABS: 0 10*3/uL (ref 0.0–0.1)
BASOS PCT: 0 % (ref 0–1)
Eosinophils Absolute: 0 10*3/uL (ref 0.0–0.7)
Eosinophils Relative: 0 % (ref 0–5)
HCT: 43.4 % (ref 36.0–46.0)
Hemoglobin: 15.4 g/dL — ABNORMAL HIGH (ref 12.0–15.0)
Lymphocytes Relative: 8 % — ABNORMAL LOW (ref 12–46)
Lymphs Abs: 1 10*3/uL (ref 0.7–4.0)
MCH: 31 pg (ref 26.0–34.0)
MCHC: 35.5 g/dL (ref 30.0–36.0)
MCV: 87.3 fL (ref 78.0–100.0)
Monocytes Absolute: 0.4 10*3/uL (ref 0.1–1.0)
Monocytes Relative: 4 % (ref 3–12)
NEUTROS ABS: 10.7 10*3/uL — AB (ref 1.7–7.7)
NEUTROS PCT: 88 % — AB (ref 43–77)
PLATELETS: 198 10*3/uL (ref 150–400)
RBC: 4.97 MIL/uL (ref 3.87–5.11)
RDW: 12.6 % (ref 11.5–15.5)
WBC: 12.1 10*3/uL — ABNORMAL HIGH (ref 4.0–10.5)

## 2014-01-31 LAB — URINALYSIS, ROUTINE W REFLEX MICROSCOPIC
Bilirubin Urine: NEGATIVE
Glucose, UA: NEGATIVE mg/dL
Hgb urine dipstick: NEGATIVE
KETONES UR: 40 mg/dL — AB
Leukocytes, UA: NEGATIVE
NITRITE: NEGATIVE
Protein, ur: 30 mg/dL — AB
Specific Gravity, Urine: 1.022 (ref 1.005–1.030)
Urobilinogen, UA: 0.2 mg/dL (ref 0.0–1.0)
pH: 5.5 (ref 5.0–8.0)

## 2014-01-31 LAB — CBC
HCT: 38.4 % (ref 36.0–46.0)
Hemoglobin: 13.5 g/dL (ref 12.0–15.0)
MCH: 31.2 pg (ref 26.0–34.0)
MCHC: 35.2 g/dL (ref 30.0–36.0)
MCV: 88.7 fL (ref 78.0–100.0)
PLATELETS: 181 10*3/uL (ref 150–400)
RBC: 4.33 MIL/uL (ref 3.87–5.11)
RDW: 12.8 % (ref 11.5–15.5)
WBC: 12.2 10*3/uL — ABNORMAL HIGH (ref 4.0–10.5)

## 2014-01-31 LAB — MRSA PCR SCREENING: MRSA by PCR: NEGATIVE

## 2014-01-31 LAB — URINE MICROSCOPIC-ADD ON

## 2014-01-31 LAB — I-STAT TROPONIN, ED: Troponin i, poc: 0 ng/mL (ref 0.00–0.08)

## 2014-01-31 LAB — PROTIME-INR
INR: 1.03 (ref 0.00–1.49)
Prothrombin Time: 13.3 seconds (ref 11.6–15.2)

## 2014-01-31 LAB — SODIUM: Sodium: 139 mEq/L (ref 137–147)

## 2014-01-31 LAB — APTT: APTT: 24 s (ref 24–37)

## 2014-01-31 LAB — LIPASE, BLOOD: Lipase: 16 U/L (ref 11–59)

## 2014-01-31 MED ORDER — CYCLOSPORINE 0.05 % OP EMUL
1.0000 [drp] | Freq: Two times a day (BID) | OPHTHALMIC | Status: DC
  Administered 2014-01-31 – 2014-02-02 (×4): 1 [drp] via OPHTHALMIC
  Administered 2014-02-02: 23:00:00 via OPHTHALMIC
  Administered 2014-02-03 (×2): 1 [drp] via OPHTHALMIC
  Filled 2014-01-31 (×14): qty 1

## 2014-01-31 MED ORDER — SENNOSIDES-DOCUSATE SODIUM 8.6-50 MG PO TABS
1.0000 | ORAL_TABLET | Freq: Every evening | ORAL | Status: DC | PRN
  Administered 2014-02-04 – 2014-02-05 (×2): 1 via ORAL
  Filled 2014-01-31: qty 1

## 2014-01-31 MED ORDER — ADULT MULTIVITAMIN W/MINERALS CH
1.0000 | ORAL_TABLET | Freq: Every day | ORAL | Status: DC
  Administered 2014-01-31 – 2014-02-06 (×7): 1 via ORAL
  Filled 2014-01-31 (×7): qty 1

## 2014-01-31 MED ORDER — HEPARIN SODIUM (PORCINE) 5000 UNIT/ML IJ SOLN
5000.0000 [IU] | Freq: Three times a day (TID) | INTRAMUSCULAR | Status: DC
  Administered 2014-01-31 – 2014-02-06 (×18): 5000 [IU] via SUBCUTANEOUS
  Filled 2014-01-31 (×20): qty 1

## 2014-01-31 MED ORDER — CLORAZEPATE DIPOTASSIUM 3.75 MG PO TABS: 3.7500 mg | ORAL_TABLET | Freq: Two times a day (BID) | ORAL | Status: DC | PRN

## 2014-01-31 MED ORDER — ASPIRIN 325 MG PO TABS
325.0000 mg | ORAL_TABLET | Freq: Every day | ORAL | Status: DC
  Administered 2014-01-31 – 2014-02-06 (×7): 325 mg via ORAL
  Filled 2014-01-31 (×8): qty 1

## 2014-01-31 MED ORDER — ASPIRIN 300 MG RE SUPP
300.0000 mg | Freq: Every day | RECTAL | Status: DC
  Filled 2014-01-31 (×4): qty 1

## 2014-01-31 MED ORDER — SODIUM CHLORIDE 0.9 % IV SOLN
INTRAVENOUS | Status: DC
  Administered 2014-01-31: 14:00:00 via INTRAVENOUS

## 2014-01-31 MED ORDER — ONE-DAILY MULTI VITAMINS PO TABS: 1.0000 | ORAL_TABLET | Freq: Every day | ORAL | Status: DC

## 2014-01-31 MED ORDER — GADOBENATE DIMEGLUMINE 529 MG/ML IV SOLN
18.0000 mL | Freq: Once | INTRAVENOUS | Status: AC | PRN
  Administered 2014-01-31: 18 mL via INTRAVENOUS

## 2014-01-31 MED ORDER — SODIUM CHLORIDE 3 % IV SOLN
INTRAVENOUS | Status: DC
  Administered 2014-01-31 – 2014-02-03 (×9): via INTRAVENOUS
  Administered 2014-02-03: 75 mL/h via INTRAVENOUS
  Administered 2014-02-04: 23:00:00 via INTRAVENOUS
  Administered 2014-02-04: 75 mL/h via INTRAVENOUS
  Administered 2014-02-04: 09:00:00 via INTRAVENOUS
  Administered 2014-02-05: 19 mL/h via INTRAVENOUS
  Administered 2014-02-05: 08:00:00 via INTRAVENOUS
  Filled 2014-01-31 (×29): qty 500

## 2014-01-31 NOTE — ED Notes (Signed)
Bed: WA09 Expected date:  Expected time:  Means of arrival:  Comments: Old person, NV, weakness

## 2014-01-31 NOTE — ED Notes (Signed)
Patient transported to MRI- again

## 2014-01-31 NOTE — ED Notes (Signed)
Pt c/o NVD x 4 days.  Was supposed to see her doctor 2 days ago but "was too weak".  Pt stated that she knew she would be too weak to answer the door so she told EMS that she walked to the bathroom and would open a window for them.  Pt was found "a good distance away from the window".  EMS had to crawl through the window to get to her.  No LOC.  No back pain.  Pt also c/o blurry line of vision.

## 2014-01-31 NOTE — ED Notes (Signed)
Pt attempted to use the bedpan but still could not urinate

## 2014-01-31 NOTE — ED Notes (Signed)
Pt attempted to urine on bedpan was unable then requested to be in and out before MRI. MRI requested to be called back and would come and get pt. [facility].

## 2014-01-31 NOTE — H&P (Signed)
[facility] History and Physical  Tammy Barron ZOX:[id number] DOB: 1941-02-17 DOA: 01/31/2014  Referring physician: Dr. Lynelle Doctor PCP: Rogelia Boga, MD   Chief Complaint: dizziness, generalized weakness, nausea  HPI: Tammy Barron is a 73 y.o. female  Right-handed Caucasian female with History of hypertension, anxiety and depression. States that yesterday morning she developed dizziness which persisted until today. She decided to come into the hospital secondary to persistent symptoms. She also endorsed nausea at times and generalized weakness. Denies weakness in any one extremity versus the other. Nothing she is aware of makes it better or worse.  While in the ED patient had a workup which revealed acute right PICA infarct in the cerebellum without hemorrhage. We were subsequently consulted for further evaluation and recommendations. Neurology also consulted and recommended transfer to [facility] step down.   Review of Systems:  Constitutional:  No weight loss, night sweats, Fevers, chills, fatigue.  HEENT:  No headaches, Difficulty swallowing,Tooth/dental problems,Sore throat,  No sneezing, itching, ear ache, nasal congestion, post nasal drip,  Cardio-vascular:  No chest pain, Orthopnea, PND, swelling in lower extremities, anasarca, dizziness, palpitations  GI:  No heartburn, indigestion, abdominal pain, nausea, vomiting, diarrhea, change in bowel habits, loss of appetite  Resp:  No shortness of breath with exertion or at rest. No excess mucus, no productive cough, No non-productive cough, No coughing up of blood.No change in color of mucus.No wheezing.No chest wall deformity  Skin:  no rash or lesions.  GU:  no dysuria, change in color of urine, no urgency or frequency. No flank pain.  Musculoskeletal:  No joint pain or swelling. No decreased range of motion. No back pain.  Psych:  No change in mood or affect. No depression or anxiety. No memory loss.    Past Medical History  Diagnosis Date  . Anxiety   . Depression   . Hypertension   . Hyperlipidemia   . Hx of abnormal Pap smear    Past Surgical History  Procedure Laterality Date  . Abdominal hysterectomy  1982   Social History:  reports that she has been smoking Cigarettes.  She has been smoking about 0.00 packs per day. She has never used smokeless tobacco. She reports that she does not drink alcohol. Her drug history is not on file.  Allergies  Allergen Reactions  . Latex Rash    Family History  Problem Relation Age of Onset  . Heart disease Mother   . Heart attack Father      Prior to Admission medications   Medication Sig Start Date End Date Taking? Authorizing Provider  bisoprolol-hydrochlorothiazide (ZIAC) 10-6.25 MG per tablet Take 1 tablet by mouth daily.   Yes Historical Provider, MD  clorazepate (TRANXENE) 3.75 MG tablet Take 1 tablet (3.75 mg total) by mouth 2 (two) times daily as needed for anxiety. 01/29/14  Yes Gordy Savers, MD  cycloSPORINE (RESTASIS) 0.05 % ophthalmic emulsion Place 1 drop into both eyes 2 (two) times daily.   Yes Historical Provider, MD  meloxicam (MOBIC) 15 MG tablet Take 15 mg by mouth daily.  01/04/13  Yes Gordy Savers, MD  Multiple Vitamin (MULTIVITAMIN) tablet Take 1 tablet by mouth daily.   Yes Historical Provider, MD   Physical Exam: Filed Vitals:   01/31/14 1427  BP: 161/64  Pulse: 93  Temp: 98.6 F (37 C)  Resp: 17    BP 161/64  Pulse 93  Temp(Src) 98.6 F (37 C) (Oral)  Resp 17  SpO2 97%  General:  Appears calm and comfortable Eyes: PERRL, normal lids, irises & conjunctiva, nystagmus with right eye gaze ENT: grossly normal hearing, lips & tongue Neck: no LAD, masses or thyromegaly Cardiovascular: RRR, no m/r/g. No LE edema. Telemetry: SR, no arrhythmias  Respiratory: CTA bilaterally, no w/r/r. Normal respiratory effort. Abdomen: soft, ntnd Skin: no rash or induration seen on limited  exam Musculoskeletal: grossly normal tone BUE/BLE Psychiatric: grossly normal mood and affect, speech fluent and appropriate Neurologic: Strength 5 over 5 in upper and lower extremities. Patient has nystagmus when gazing to the right. Sensation to light touch intact in upper and lower extremities.answers questions appropriately.           Labs on Admission:  Basic Metabolic Panel:  Recent Labs Lab 01/31/14 1112  NA 136*  K 3.4*  CL 94*  CO2 24  GLUCOSE 171*  BUN 29*  CREATININE 0.69  CALCIUM 9.8   Liver Function Tests:  Recent Labs Lab 01/31/14 1112  AST 20  ALT 12  ALKPHOS 52  BILITOT 0.5  PROT 7.6  ALBUMIN 3.6    Recent Labs Lab 01/31/14 1112  LIPASE 16   No results found for this basename: AMMONIA,  in the last 168 hours CBC:  Recent Labs Lab 01/31/14 1112  WBC 12.1*  NEUTROABS 10.7*  HGB 15.4*  HCT 43.4  MCV 87.3  PLT 198   Cardiac Enzymes: No results found for this basename: CKTOTAL, CKMB, CKMBINDEX, TROPONINI,  in the last 168 hours  BNP (last 3 results) No results found for this basename: PROBNP,  in the last 8760 hours CBG: No results found for this basename: GLUCAP,  in the last 168 hours  Radiological Exams on Admission: Ct Head Wo Contrast  01/31/2014   CLINICAL DATA:  Altered mental status and blurred vision ; Nausea  EXAM: CT HEAD WITHOUT CONTRAST  TECHNIQUE: Contiguous axial images were obtained from the base of the skull through the vertex without intravenous contrast. Study was obtained within 24 hr of patient's arrival at the emergency department.  COMPARISON:  None.  FINDINGS: There is edema throughout most of the right cerebellum with mass effect on the fourth ventricle. The fourth ventricle is partially effaced and displaced minimally toward the left. There is relative enlargement of the lateral and third ventricles. There is some increased attenuation in the periphery of the right cerebellum which potentially could represent subtle  hemorrhage. No other hemorrhage seen. There is a degree of underlying mild generalized atrophy. There is no extra-axial fluid or midline shift in the supratentorial region.  Elsewhere gray-white compartments appear normal with the exception of minimal small vessel disease in the centra semiovale bilaterally.  Bony calvarium appears intact.  The mastoid air cells are clear.  IMPRESSION: Extensive right cerebellar edema causing mass effect and effacement on the fourth ventricle and relative hydrocephalus of the lateral and third ventricles. The appearance in the right cerebellum is concerning for potential mass, although in acute infarct could present similarly. There may be some hemorrhage along the periphery of the right cerebellum in this lesion. No other hemorrhage seen. MR correlation with and without intravenous contrast material advised.  Elsewhere, there is mild small vessel disease in the centra semiovale bilaterally. The lateral and third ventricle are midline in location.  Critical Value/emergent results were called by telephone at the time of interpretation on 01/31/2014 at 1:09 PM to Dr. Linwood DibblesJON KNAPP , who verbally acknowledged these results.   Electronically Signed   By: Bretta BangWilliam  Woodruff M.D.  On: 01/31/2014 13:10   Mr Maxine Glenn Head Wo Contrast  01/31/2014   CLINICAL DATA:  Stroke.  EXAM: MRI HEAD WITHOUT CONTRAST  MRA HEAD WITHOUT CONTRAST  TECHNIQUE: Multiplanar, multiecho pulse sequences of the brain and surrounding structures were obtained without intravenous contrast. Angiographic images of the head were obtained using MRA technique without contrast.  COMPARISON:  CT head 01/31/2014  FINDINGS: MRI HEAD FINDINGS  Acute infarct in the right cerebellum involving the right PICA territory. This involves a large amount of the right cerebellum, essentially the entire PICA territory and also some of the AICA territory. No associated hemorrhage. There is cerebellar edema with mass-effect on the fourth ventricle  and medulla. No other acute infarct.  Generalized atrophy. Negative for hemorrhage or mass. Mild chronic microvascular ischemic change in the white matter.  Postcontrast imaging reveals normal enhancement. No enhancement of the infarct. No evidence of intracranial tumor.  MRA HEAD FINDINGS  The left vertebral artery is widely patent. Left PICA is patent. Right vertebral artery is small and irregular. There is a small amount of flow in proximal right PICA which then occludes compatible with acute infarct. The basilar is patent. Superior cerebellar and posterior cerebral arteries are patent bilaterally. Mild stenosis in the right posterior cerebral artery.  The internal carotid artery is patent bilaterally. Anterior and middle cerebral arteries are patent bilaterally without stenosis.  Negative for cerebral aneurysm.  IMPRESSION: Acute right PICA infarct in the cerebellum without hemorrhage. There is edema and mass-effect on the fourth ventricle without hydrocephalus.  There is generalized atrophy.  Negative for hemorrhage or mass.  Small and irregular distal right vertebral artery with occlusion of the right PICA consistent with acute infarct.   Electronically Signed   By: Marlan Palau M.D.   On: 01/31/2014 15:51   Mr Laqueta Jean ZO Contrast  01/31/2014   CLINICAL DATA:  Stroke.  EXAM: MRI HEAD WITHOUT CONTRAST  MRA HEAD WITHOUT CONTRAST  TECHNIQUE: Multiplanar, multiecho pulse sequences of the brain and surrounding structures were obtained without intravenous contrast. Angiographic images of the head were obtained using MRA technique without contrast.  COMPARISON:  CT head 01/31/2014  FINDINGS: MRI HEAD FINDINGS  Acute infarct in the right cerebellum involving the right PICA territory. This involves a large amount of the right cerebellum, essentially the entire PICA territory and also some of the AICA territory. No associated hemorrhage. There is cerebellar edema with mass-effect on the fourth ventricle and medulla.  No other acute infarct.  Generalized atrophy. Negative for hemorrhage or mass. Mild chronic microvascular ischemic change in the white matter.  Postcontrast imaging reveals normal enhancement. No enhancement of the infarct. No evidence of intracranial tumor.  MRA HEAD FINDINGS  The left vertebral artery is widely patent. Left PICA is patent. Right vertebral artery is small and irregular. There is a small amount of flow in proximal right PICA which then occludes compatible with acute infarct. The basilar is patent. Superior cerebellar and posterior cerebral arteries are patent bilaterally. Mild stenosis in the right posterior cerebral artery.  The internal carotid artery is patent bilaterally. Anterior and middle cerebral arteries are patent bilaterally without stenosis.  Negative for cerebral aneurysm.  IMPRESSION: Acute right PICA infarct in the cerebellum without hemorrhage. There is edema and mass-effect on the fourth ventricle without hydrocephalus.  There is generalized atrophy.  Negative for hemorrhage or mass.  Small and irregular distal right vertebral artery with occlusion of the right PICA consistent with acute infarct.   Electronically Signed  By: Marlan Palau M.D.   On: 01/31/2014 15:51    EKG: Independently reviewed. Sinus rhythm with no ST elevations or depressions  Assessment/Plan Active Problems:   Cerebellar stroke - Neurology consulted by ED physician and will evaluate patient once the patient gets to [facility] - Will admit to step down - Routine stroke order set placed please review for details - MRI reported acute right PICA infarct in the cerebellum without hemorrhage. There is edema and mass effect on the fourth ventricle without hydrocephalus  Hypertension -We'll hold blood pressure medication secondary to principal problem list above  Anxiety -We'll continue home regimen. Currently stable  DVT prophylaxis -Heparin  Code Status: DNR Family  Communication: discussed directly with patient. Disposition Plan: [facility] bed   Time spent: > 50 minutes  Penny Pia [facility] Pager [phone]

## 2014-01-31 NOTE — Progress Notes (Signed)
[facility] care [facility]. Patient is alert and oriented x's4. Patient is experiencing a headache as well as experiencing light sensitive. Patient instructed in calling for a nurse and not to get out of bed by herself.Settled into the room.

## 2014-01-31 NOTE — ED Notes (Signed)
Pt was asked for a urine sample and stated that she could not urinate right now

## 2014-01-31 NOTE — Progress Notes (Signed)
Dr. Amada JupiterKirkpatrick at pt's bedside. New orders received to transfer patient to ICU for closer monitoring. Pt called her family on her cell phone to update them. Pt transferred to 3M05 with belongings. CCMD and [facility] notified.

## 2014-01-31 NOTE — ED Provider Notes (Signed)
CSN: [id number]     Arrival date & time 01/31/14  1043 History   First MD Initiated Contact with Patient 01/31/14 1103     Chief Complaint  Patient presents with  . Nausea  . Emesis  . Diarrhea    HPI Comments: Pt has felt very weak as if she was going to pass out.  Whenever she moves around she starts to feel nauseated.  She has had a mild headache at times.  No blurred vision or double vision.  She does feel like she has had lines across her vision.  She feels very weak and dizzy.  When she called ems she did not feel like she would be able to get through the door so ems had to get to her by crawling through a window.  Patient is a 73 y.o. female presenting with vomiting, diarrhea, and near-syncope.  Emesis Associated symptoms: diarrhea   Associated symptoms: no abdominal pain   Diarrhea Quality:  Semi-solid Severity:  Mild Number of episodes:  1 or two Duration:  2 days Timing:  Intermittent Associated symptoms: vomiting   Associated symptoms: no abdominal pain and no fever   Vomiting:    Number of occurrences:  6-8   Severity:  Moderate Risk factors: no recent antibiotic use   Near Syncope This is a new problem. The current episode started 2 days ago. The problem occurs constantly. The problem has been gradually worsening. Pertinent negatives include no chest pain, no abdominal pain and no shortness of breath. The symptoms are aggravated by walking. Nothing relieves the symptoms.    Past Medical History  Diagnosis Date  . Anxiety   . Depression   . Hypertension   . Hyperlipidemia   . Hx of abnormal Pap smear    Past Surgical History  Procedure Laterality Date  . Abdominal hysterectomy  1982   Family History  Problem Relation Age of Onset  . Heart disease Mother   . Heart attack Father    History  Substance Use Topics  . Smoking status: Current Some Day Smoker    Types: Cigarettes  . Smokeless tobacco: Never Used  . Alcohol Use: No   OB History   Grav Para  Term Preterm Abortions TAB SAB Ect Mult Living   1 1             Review of Systems  Constitutional: Negative for fever.  Eyes: Negative for pain, discharge, redness and itching.  Respiratory: Negative for shortness of breath.   Cardiovascular: Positive for near-syncope. Negative for chest pain.  Gastrointestinal: Positive for nausea, vomiting and diarrhea. Negative for abdominal pain and blood in stool.       Has not eaten much in last day or two  All other systems reviewed and are negative.      Allergies  Latex  Home Medications   Current Outpatient Rx  Name  Route  Sig  Dispense  Refill  . clorazepate (TRANXENE) 3.75 MG tablet   Oral   Take 1 tablet (3.75 mg total) by mouth 2 (two) times daily as needed for anxiety.   30 tablet   5   . meloxicam (MOBIC) 15 MG tablet               . Multiple Vitamin (MULTIVITAMIN) tablet   Oral   Take 1 tablet by mouth daily.          BP 136/52  Pulse 82  Temp(Src) 98.4 F (36.9 C) (Oral)  Resp 14  SpO2 95% Physical Exam  Nursing note and vitals reviewed. Constitutional: She is oriented to person, place, and time. No distress.  HENT:  Head: Normocephalic and atraumatic.  Right Ear: External ear normal.  Left Ear: External ear normal.  Mouth/Throat: Oropharyngeal exudate (mm dry) present.  Eyes: Conjunctivae and EOM are normal. Pupils are equal, round, and reactive to light. Right eye exhibits no discharge. Left eye exhibits no discharge. No scleral icterus.  Neck: Neck supple. No tracheal deviation present.  Cardiovascular: Normal rate, regular rhythm and intact distal pulses.   Pulmonary/Chest: Effort normal and breath sounds normal. No stridor. No respiratory distress. She has no wheezes. She has no rales.  Abdominal: Soft. Bowel sounds are normal. She exhibits no distension. There is no tenderness. There is no rebound and no guarding.  Musculoskeletal: She exhibits no edema and no tenderness.  Neurological: She is  alert and oriented to person, place, and time. She has normal strength. No cranial nerve deficit (no facial droop, extraocular movements intact, no slurred speech) or sensory deficit. She exhibits normal muscle tone. She displays no seizure activity. Coordination normal.  slight pronator drift bilateral right extrem, able to hold both legs off bed for 5 seconds, sensation intact in all extremities, no visual field cuts, no left or right sided neglect, abnormal finger-nose exam with difficulty touching the tip of my finger, no nystagmus noted   Skin: Skin is warm and dry. No rash noted.  Psychiatric: She has a normal mood and affect.    ED Course  Procedures (including critical care time) CRITICAL CARE Performed by: Linwood Dibbles R Total critical care time: 45 Critical care time was exclusive of separately billable procedures and treating other patients. Critical care was necessary to treat or prevent imminent or life-threatening deterioration. Critical care was time spent personally by me on the following activities: development of treatment plan with patient and/or surrogate as well as nursing, discussions with consultants, evaluation of patient's response to treatment, examination of patient, obtaining history from patient or surrogate, ordering and performing treatments and interventions, ordering and review of laboratory studies, ordering and review of radiographic studies, pulse oximetry and re-evaluation of patient's condition.  Labs Review Labs Reviewed  CBC WITH DIFFERENTIAL - Abnormal; Notable for the following:    WBC 12.1 (*)    Hemoglobin 15.4 (*)    Neutrophils Relative % 88 (*)    Neutro Abs 10.7 (*)    Lymphocytes Relative 8 (*)    All other components within normal limits  COMPREHENSIVE METABOLIC PANEL - Abnormal; Notable for the following:    Sodium 136 (*)    Potassium 3.4 (*)    Chloride 94 (*)    Glucose, Bld 171 (*)    BUN 29 (*)    GFR calc non Af Amer 84 (*)    All  other components within normal limits  LIPASE, BLOOD  PROTIME-INR  APTT  URINALYSIS, ROUTINE W REFLEX MICROSCOPIC  I-STAT TROPOININ, ED   Imaging Review Ct Head Wo Contrast  01/31/2014   CLINICAL DATA:  Altered mental status and blurred vision ; Nausea  EXAM: CT HEAD WITHOUT CONTRAST  TECHNIQUE: Contiguous axial images were obtained from the base of the skull through the vertex without intravenous contrast. Study was obtained within 24 hr of patient's arrival at the emergency department.  COMPARISON:  None.  FINDINGS: There is edema throughout most of the right cerebellum with mass effect on the fourth ventricle. The fourth ventricle is partially effaced and displaced minimally toward  the left. There is relative enlargement of the lateral and third ventricles. There is some increased attenuation in the periphery of the right cerebellum which potentially could represent subtle hemorrhage. No other hemorrhage seen. There is a degree of underlying mild generalized atrophy. There is no extra-axial fluid or midline shift in the supratentorial region.  Elsewhere gray-white compartments appear normal with the exception of minimal small vessel disease in the centra semiovale bilaterally.  Bony calvarium appears intact.  The mastoid air cells are clear.  IMPRESSION: Extensive right cerebellar edema causing mass effect and effacement on the fourth ventricle and relative hydrocephalus of the lateral and third ventricles. The appearance in the right cerebellum is concerning for potential mass, although in acute infarct could present similarly. There may be some hemorrhage along the periphery of the right cerebellum in this lesion. No other hemorrhage seen. MR correlation with and without intravenous contrast material advised.  Elsewhere, there is mild small vessel disease in the centra semiovale bilaterally. The lateral and third ventricle are midline in location.  Critical Value/emergent results were called by  telephone at the time of interpretation on 01/31/2014 at 1:09 PM to Dr. Linwood DibblesJON Toron Bowring , who verbally acknowledged these results.   Electronically Signed   By: Bretta BangWilliam  Woodruff M.D.   On: 01/31/2014 13:10     EKG Interpretation   Date/Time:  Wednesday January 31 2014 12:39:14 EDT Ventricular Rate:  83 PR Interval:  203 QRS Duration: 81 QT Interval:  388 QTC Calculation: 456 R Axis:   44 Text Interpretation:  Sinus rhythm No previous tracing Confirmed by Nigel Ericsson   MD-J, Kaitlin Ardito ([id number]) on 01/31/2014 12:42:56 PM     1330  Discussed the CT findings with the patient including the possibility of stroke versus mass.  Pt remains alert and oriented.  GCS 15.   I consulted with Dr Roseanne RenoStewart, Neurology.  MRI has been ordered. 1618  Updated pt on findings.  SHe remains alert and oriented , gcs 15. MDM   Final diagnoses:  Cerebellar stroke     MRI showed a cerebellar stroke.  Pt is not a candidate for acute intervention unfortunately, Sx onset 48 hours ago.  D/w Dr Roseanne RenoStewart, [facility]     Celene KrasJon R Desera Graffeo, MD 01/31/14 [phone]

## 2014-01-31 NOTE — Consult Note (Addendum)
Neurology Consultation Reason for Consult: Stroke Referring Physician: Penny Pia  CC: Dizziness  History is obtained from: Patient  HPI: Tammy Barron is a 73 y.o. female with a history of hypertension, hyperlipidemia, tobacco abuse who presents with 2 days of dizziness. She states that she tried to get out of bed Tuesday morning and she had difficulty walking. She has had persistent vertigo and nausea. Today it was like a better and therefore she sought further treatment in the emergency room.  LKW: Monday 3/30 prior to bedtime.  tpa given?: no, outside of window  ROS: A 14 point ROS was performed and is negative except as noted in the HPI.  Past Medical History  Diagnosis Date  . Anxiety   . Depression   . Hypertension   . Hyperlipidemia   . Hx of abnormal Pap smear     Family History: No hx stroke  Social History: Tob: current smoker  Exam: Current vital signs: BP 168/72  Pulse 89  Temp(Src) 98.5 F (36.9 C) (Oral)  Resp 16  SpO2 96% Vital signs in last 24 hours: Temp:  [98.4 F (36.9 C)-98.6 F (37 C)] 98.5 F (36.9 C) (04/01 1734) Pulse Rate:  [81-93] 89 (04/01 1734) Resp:  [14-17] 16 (04/01 1734) BP: (136-168)/(52-72) 168/72 mmHg (04/01 1734) SpO2:  [95 %-97 %] 96 % (04/01 1734)  General: in bed, nad CV: rrr Mental Status: Patient is awake, alert, oriented to person, place, month, and situation. Gives year as 80. Immediate and remote memory are intact. Patient is able to give a clear and coherent history. No signs of aphasia or neglect Cranial Nerves: II: Visual Fields are full. Pupils are equal, round, and reactive to light.  Discs are difficult to visualize. III,IV, VI: EOMI without ptosis or diploplia.  V: Facial sensation is symmetric to temperature VII: Facial movement is symmetric.  VIII: hearing is intact to voice X: Uvula elevates symmetrically XI: Shoulder shrug is symmetric. XII: tongue is midline without atrophy or  fasciculations.  Motor: Tone is normal. Bulk is normal. 5/5 strength was present in all four extremities.  Sensory: Sensation is symmetric to light touch and temperature in the arms and legs. Deep Tendon Reflexes: 2+ and symmetric in the biceps and patellae.  Plantars: Toes are downgoing bilaterally.  Cerebellar: FNF and HKS are intact on left, dysmetria present on right.  Gait: Not tested 2/2 patient safety concerns.   I have reviewed labs in epic and the results pertinent to this consultation are: cmp- mild hypokalemia, mildly elevated BG.   I have reviewed the images obtained:MRI brain - extensive posterior fossa infarct. No HCPS.  Impression: 73 yo F with large PICA infarct. She has some pressure on her fourth ventricle, and her imaging was approximately 36 hours into her symptoms. She currently is mentating well, but is complaining of feeling much more sleepy than is typical. I think that she is at high risk of developing hydrocephalus and that she would benefit from treatment of cerebral edema to try to avoid this.   I discussed the possibility of worsening with her including possible need for neurosurgical intervention if hydrocephalus develops. I indicated that we would transfer her to an intensive care unit for close monitoring and hyperosmolar therapy.   Recommendations: 1. Transfer to ICU for hyperosmolar therapy. 2. HgbA1c, fasting lipid panel 3. Frequent neuro checks 4. Echocardiogram 5. Carotid dopplers 6. Prophylactic therapy-Antiplatelet med: Aspirin - dose 325mg  PO or 300mg  PR 7. Risk factor modification 8. Telemetry monitoring  9. PT consult, OT consult, Speech consult 10. Repeat CT tomorrow am or earlier if any change in neuro status.    This patient is critically ill and at significant risk of neurological worsening, death and care requires constant monitoring of vital signs, hemodynamics,respiratory and cardiac monitoring, neurological assessment, discussion  with family, other specialists and medical decision making of high complexity. I spent 60 minutes of neurocritical care time  in the care of  this patient.  Ritta SlotMcNeill Tilford Deaton, MD [facility] [phone]  If 7pm- 7am, please page neurology on call as listed in AMION. 01/31/2014  8:56 PM

## 2014-01-31 NOTE — ED Notes (Signed)
Patient transported to MRI 

## 2014-01-31 NOTE — Procedures (Signed)
Central Venous Catheter Insertion Procedure Note Tammy ClarksMargaret L Barron [id number] 10-Mar-1941  Procedure: Insertion of Central Venous Catheter Indications: Drug and/or fluid administration  Procedure Details Consent: Risks of procedure as well as the alternatives and risks of each were explained to the (patient/caregiver).  Consent for procedure obtained. Time Out: Verified patient identification, verified procedure, site/side was marked, verified correct patient position, special equipment/implants available, medications/allergies/relevent history reviewed, required imaging and test results available.  Performed  Maximum sterile technique was used including antiseptics, cap, gloves, gown, hand hygiene, mask and sheet. Skin prep: Chlorhexidine; local anesthetic administered A antimicrobial bonded/coated triple lumen catheter was placed in the left internal jugular vein using the Seldinger technique. Ultrasound guidance used.yes Catheter placed to 19 cm. Blood aspirated via all 3 ports and then flushed x 3. Line sutured x 2 and dressing applied.  Evaluation Blood flow good Complications: No apparent complications Patient did tolerate procedure well. Chest X-ray ordered to verify placement.  CXR: pending.  Joneen RoachPaul Hoffman, ACNP [facility] Pulmonology/Critical Care Pager [phone] or [phone]  Reviewed.  Lonia FarberZUBELEVITSKIY, Destanie Tibbetts, MD Pulmonary and Critical Care Medicine [facility] Pager: [phone]

## 2014-01-31 NOTE — Progress Notes (Signed)
I spoke with the patient regarding her DNR/DNI status. She indicates that she would not want cardiac compressions or cardioversion in the event of a cardiac arrest. She would also not want to have a breathing tube over a long period of time, but if needed for a short period in the setting of depressed mentation or need for procedure, she would want it in that case.   I am updating her DNR order to reflect those wishes.   Tammy SlotMcNeill Jahleel Stroschein, MD [facility] [phone]  If 7pm- 7am, please page neurology on call as listed in AMION.

## 2014-01-31 NOTE — ED Notes (Addendum)
TRANSFERED TO [facility] 3S11 PAULINE RN GIVEN REPORT CARELINK RN GIVEN REPORT. VOIDED BEFORE TRANSFER. CARELINK TO ASSIST

## 2014-02-01 ENCOUNTER — Inpatient Hospital Stay (HOSPITAL_COMMUNITY): Payer: Medicare Other

## 2014-02-01 DIAGNOSIS — G936 Cerebral edema: Secondary | ICD-10-CM

## 2014-02-01 DIAGNOSIS — G911 Obstructive hydrocephalus: Secondary | ICD-10-CM | POA: Diagnosis not present

## 2014-02-01 DIAGNOSIS — F411 Generalized anxiety disorder: Secondary | ICD-10-CM

## 2014-02-01 DIAGNOSIS — I517 Cardiomegaly: Secondary | ICD-10-CM

## 2014-02-01 LAB — URINALYSIS, ROUTINE W REFLEX MICROSCOPIC
Bilirubin Urine: NEGATIVE
Glucose, UA: NEGATIVE mg/dL
Hgb urine dipstick: NEGATIVE
Ketones, ur: NEGATIVE mg/dL
LEUKOCYTES UA: NEGATIVE
NITRITE: NEGATIVE
PROTEIN: NEGATIVE mg/dL
Specific Gravity, Urine: 1.021 (ref 1.005–1.030)
UROBILINOGEN UA: 0.2 mg/dL (ref 0.0–1.0)
pH: 6 (ref 5.0–8.0)

## 2014-02-01 LAB — LIPID PANEL
CHOLESTEROL: 209 mg/dL — AB (ref 0–200)
HDL: 45 mg/dL (ref >39)
LDL Cholesterol: 126 mg/dL — ABNORMAL HIGH (ref 0–99)
TRIGLYCERIDES: 189 mg/dL — AB (ref <150)
Total CHOL/HDL Ratio: 4.6 RATIO
VLDL: 38 mg/dL (ref 0–40)

## 2014-02-01 LAB — SODIUM
SODIUM: 142 meq/L (ref 137–147)
SODIUM: 143 meq/L (ref 137–147)
Sodium: 144 mEq/L (ref 137–147)
Sodium: 145 mEq/L (ref 137–147)

## 2014-02-01 LAB — HEMOGLOBIN A1C
Hgb A1c MFr Bld: 5.3 % (ref <5.7)
Mean Plasma Glucose: 105 mg/dL (ref <117)

## 2014-02-01 MED ORDER — LABETALOL HCL 5 MG/ML IV SOLN: 10.0000 mg | INTRAVENOUS | Status: DC | PRN

## 2014-02-01 MED ORDER — STROKE: EARLY STAGES OF RECOVERY BOOK
Freq: Once | Status: DC
  Filled 2014-02-01 (×2): qty 1

## 2014-02-01 MED ORDER — SENNOSIDES-DOCUSATE SODIUM 8.6-50 MG PO TABS
1.0000 | ORAL_TABLET | Freq: Two times a day (BID) | ORAL | Status: DC
  Filled 2014-02-01 (×2): qty 1

## 2014-02-01 MED ORDER — PANTOPRAZOLE SODIUM 40 MG IV SOLR: 40.0000 mg | Freq: Every day | INTRAVENOUS | Status: DC

## 2014-02-01 MED ORDER — ACETAMINOPHEN 650 MG RE SUPP: 650.0000 mg | RECTAL | Status: DC | PRN

## 2014-02-01 MED ORDER — SODIUM CHLORIDE 0.9 % IV SOLN: INTRAVENOUS | Status: DC

## 2014-02-01 MED ORDER — ACETAMINOPHEN 325 MG PO TABS: 650.0000 mg | ORAL_TABLET | ORAL | Status: DC | PRN

## 2014-02-01 MED ORDER — CLORAZEPATE DIPOTASSIUM 3.75 MG PO TABS
3.7500 mg | ORAL_TABLET | Freq: Every evening | ORAL | Status: DC | PRN
  Administered 2014-02-01 – 2014-02-05 (×2): 3.75 mg via ORAL
  Filled 2014-02-01 (×2): qty 1

## 2014-02-01 NOTE — Progress Notes (Signed)
[facility] Progress Note [place] TEAM 1 - Stepdown/ICU TEAM   Tammy ClarksMargaret L Barron [id number] DOB: Mar 30, 1941 DOA: 01/31/2014 PCP: Rogelia BogaKWIATKOWSKI,PETER FRANK, MD  Brief narrative: Tammy Barron is a 73 y.o. female presenting on 01/31/2014 with  has a past medical history of Anxiety; Depression; Hypertension; Hyperlipidemia, tobacco abuse who presents with a few days of "dizziness" which is actaully vertigo. She was found to have a right PICA infarct with mass effect and hydrocephalus. Right PICA noted to be occluded suspected by Neurology to be an embolic source.    Subjective: Sat up to eat earlier, no vertigo noted.    Assessment/Plan: Principal Problem:   Cerebellar stroke with hydrocephalus and mass effect - as mentioned above, looking for an embolic source- f/u TTE  - 3% saline, full dose ASA per Neuro   Active Problems:   HYPERLIPIDEMIA - statin started this admission    ANXIETY - PRN Tranxene    HYPERTENSION - Stroke team allowing permissive HTN??- Ziac on hold  Code Status: NO CPR per Dr Alene MiresKirkpatrick's discussion with pt- see his note  Family Communication: none at bedside Disposition Plan: Follow in ICU due   Consultants: Pearlean BrownieSethi  Procedures: none  Antibiotics: Antibiotics Given (last 72 hours)   None       DVT prophylaxis: Heparin  Objective: Filed Weights   01/31/14 1900  Weight: 86.3 kg (190 lb 4.1 oz)   Blood pressure 180/68, pulse 76, temperature 98.4 F (36.9 C), temperature source Oral, resp. rate 21, height 5' 4.5" (1.638 m), weight 86.3 kg (190 lb 4.1 oz), SpO2 95.00%.  Intake/Output Summary (Last 24 hours) at 02/01/14 1814 Last data filed at 02/01/14 1600  Gross per 24 hour  Intake 2236.25 ml  Output    750 ml  Net 1486.25 ml     Exam: General: No acute respiratory distress Lungs: Clear to auscultation bilaterally without wheezes or crackles Cardiovascular: Regular rate and rhythm without murmur gallop or rub normal S1  and S2 Abdomen: Nontender, nondistended, soft, bowel sounds positive, no rebound, no ascites, no appreciable mass Extremities: No significant cyanosis, clubbing, or edema bilateral lower extremities  Data Reviewed: Basic Metabolic Panel:  Recent Labs Lab 01/31/14 1112 01/31/14 2255 02/01/14 0450 02/01/14 0850 02/01/14 1435  NA 136* 139 142 144 145  K 3.4*  --   --   --   --   CL 94*  --   --   --   --   CO2 24  --   --   --   --   GLUCOSE 171*  --   --   --   --   BUN 29*  --   --   --   --   CREATININE 0.69 0.66  --   --   --   CALCIUM 9.8  --   --   --   --    Liver Function Tests:  Recent Labs Lab 01/31/14 1112  AST 20  ALT 12  ALKPHOS 52  BILITOT 0.5  PROT 7.6  ALBUMIN 3.6    Recent Labs Lab 01/31/14 1112  LIPASE 16   No results found for this basename: AMMONIA,  in the last 168 hours CBC:  Recent Labs Lab 01/31/14 1112 01/31/14 2255  WBC 12.1* 12.2*  NEUTROABS 10.7*  --   HGB 15.4* 13.5  HCT 43.4 38.4  MCV 87.3 88.7  PLT 198 181   Cardiac Enzymes: No results found for this basename: CKTOTAL, CKMB, CKMBINDEX, TROPONINI,  in the last 168 hours BNP (last 3 results) No results found for this basename: PROBNP,  in the last 8760 hours CBG: No results found for this basename: GLUCAP,  in the last 168 hours  Recent Results (from the past 240 hour(s))  MRSA PCR SCREENING     Status: None   Collection Time    01/31/14  7:00 PM      Result Value Ref Range Status   MRSA by PCR NEGATIVE  NEGATIVE Final   Comment:            The GeneXpert MRSA Assay (FDA     approved for NASAL specimens     only), is one component of a     comprehensive MRSA colonization     surveillance program. It is not     intended to diagnose MRSA     infection nor to guide or     monitor treatment for     MRSA infections.     Studies:  Recent x-ray studies have been reviewed in detail by the Attending Physician  Scheduled Meds:  Scheduled Meds: .  stroke: mapping our  early stages of recovery book   Does not apply Once  . aspirin  300 mg Rectal Daily   Or  . aspirin  325 mg Oral Daily  . cycloSPORINE  1 drop Both Eyes BID  . heparin  5,000 Units Subcutaneous 3 times per day  . multivitamin with minerals  1 tablet Oral Daily   Continuous Infusions: . sodium chloride (hypertonic) 75 mL/hr at 02/01/14 1600    Time spent on care of this patient: 30 min   Calvert Cantor, MD  [facility] Office  [phone] Pager - Text Page per Loretha Stapler as per below:  On-Call/Text Page:      [email or web address]  If 7PM-7AM, please contact night-coverage [email or web address] 02/01/2014, 6:14 PM   LOS: 1 day

## 2014-02-01 NOTE — Progress Notes (Signed)
Utilization review completed. Glover Capano, RN, BSN. 

## 2014-02-01 NOTE — Progress Notes (Signed)
PT Cancellation Note  Patient Details Name: Cleda ClarksMargaret L Kamel MRN: [id number] DOB: [birth year]   Cancelled Treatment:    Reason Eval/Treat Not Completed: Medical issues which prohibited therapy;Patient not medically ready. Per neuro note pt to stay in bed today. Will attempt evaluation when medically ready.    Donnamarie PoagWest, Orin Eberwein [place], [place] [phone] 02/01/2014, 9:08 AM

## 2014-02-01 NOTE — Progress Notes (Signed)
  Echocardiogram 2D Echocardiogram has been performed.  Georgian CoWILLIAMS, Tailor Westfall 02/01/2014, 5:12 PM

## 2014-02-01 NOTE — Progress Notes (Signed)
Made dr. Amada JupiterKirkpatrick aware of patient not voiding. Bladder scanned 166cc. No further orders received and will continue to monitor patient.

## 2014-02-01 NOTE — Progress Notes (Signed)
Stroke Team Progress Note  HISTORY Tammy Barron is a 73 y.o. female with a history of hypertension, hyperlipidemia, tobacco abuse who presents with 2 days of dizziness. She states that she tried to get out of bed Tuesday morning and she had difficulty walking. She has had persistent vertigo and nausea. Today 01/31/2014 it was like a better and therefore she sought further treatment in the emergency room. LKW: Monday 3/30 prior to bedtime. Patient was not administerd TPA secondary to delay in arrival. She was admitted to floor, then transferred to the the neuro ICU due to increased lethray for hypertonic saline treatment and further evaluation and treatment.  SUBJECTIVE No family is at the bedside.  Overall she feels her condition is stable. She now reports she started feeling bad on Sunday. She reports she is sleepier than usual.  OBJECTIVE Most recent Vital Signs: Filed Vitals:   02/01/14 0500 02/01/14 0600 02/01/14 0700 02/01/14 0800  BP: 184/78 169/71 189/87   Pulse: 82 70 79   Temp:    98.9 F (37.2 C)  TempSrc:    Oral  Resp: 20 19 20    Height:      Weight:      SpO2: 95% 94% 91%    CBG (last 3)  No results found for this basename: GLUCAP,  in the last 72 hours  IV Fluid Intake:   . sodium chloride Stopped (01/31/14 1419)  . sodium chloride (hypertonic) 75 mL/hr at 02/01/14 0700    MEDICATIONS  . aspirin  300 mg Rectal Daily   Or  . aspirin  325 mg Oral Daily  . cycloSPORINE  1 drop Both Eyes BID  . heparin  5,000 Units Subcutaneous 3 times per day  . multivitamin with minerals  1 tablet Oral Daily   PRN:  clorazepate, senna-docusate  Diet:  Clear Liquid thin liquids Activity:  Bedrest with HOB flat DVT Prophylaxis:  Heparin 5000 units sq tid   CLINICALLY SIGNIFICANT STUDIES Basic Metabolic Panel:   Recent Labs Lab 01/31/14 1112 01/31/14 2255 02/01/14 0450  NA 136* 139 142  K 3.4*  --   --   CL 94*  --   --   CO2 24  --   --   GLUCOSE 171*  --   --   BUN  29*  --   --   CREATININE 0.69 0.66  --   CALCIUM 9.8  --   --    Liver Function Tests:   Recent Labs Lab 01/31/14 1112  AST 20  ALT 12  ALKPHOS 52  BILITOT 0.5  PROT 7.6  ALBUMIN 3.6   CBC:   Recent Labs Lab 01/31/14 1112 01/31/14 2255  WBC 12.1* 12.2*  NEUTROABS 10.7*  --   HGB 15.4* 13.5  HCT 43.4 38.4  MCV 87.3 88.7  PLT 198 181   Coagulation:   Recent Labs Lab 01/31/14 1233  LABPROT 13.3  INR 1.03   Cardiac Enzymes: No results found for this basename: CKTOTAL, CKMB, CKMBINDEX, TROPONINI,  in the last 168 hours Urinalysis:   Recent Labs Lab 01/31/14 1444  COLORURINE YELLOW  LABSPEC 1.022  PHURINE 5.5  GLUCOSEU NEGATIVE  HGBUR NEGATIVE  BILIRUBINUR NEGATIVE  KETONESUR 40*  PROTEINUR 30*  UROBILINOGEN 0.2  NITRITE NEGATIVE  LEUKOCYTESUR NEGATIVE   Lipid Panel    Component Value Date/Time   CHOL 209* 05/16/2013 0943   TRIG 317.0* 05/16/2013 0943   HDL 40.30 05/16/2013 0943   CHOLHDL 5 05/16/2013 16100943  VLDL 63.4* 05/16/2013 0943   HgbA1C  No results found for this basename: HGBA1C    Urine Drug Screen:   No results found for this basename: labopia,  cocainscrnur,  labbenz,  amphetmu,  thcu,  labbarb    Alcohol Level: No results found for this basename: ETH,  in the last 168 hours  CT of the brain   02/01/2014    Evolving right posterior inferior cerebellar artery territory infarct with worsening posterior fossa mass effect, increasing hydrocephalus. No hemorrhagic conversion. Effaced pre pontine cistern with mass effect on the basilar artery.   01/31/2014    Extensive right cerebellar edema causing mass effect and effacement on the fourth ventricle and relative hydrocephalus of the lateral and third ventricles. The appearance in the right cerebellum is concerning for potential mass, although in acute infarct could present similarly. There may be some hemorrhage along the periphery of the right cerebellum in this lesion. No other hemorrhage seen. MR  correlation with and without intravenous contrast material advised.  Elsewhere, there is mild small vessel disease in the centra semiovale bilaterally. The lateral and third ventricle are midline in location.    MRI of the brain  01/31/2014   Acute right PICA infarct in the cerebellum without hemorrhage. There is edema and mass-effect on the fourth ventricle without hydrocephalus.  There is generalized atrophy.  Negative for hemorrhage or mass.    MRA of the brain  01/31/2014   Small and irregular distal right vertebral artery with occlusion of the right PICA consistent with acute infarct.     2D Echocardiogram    Carotid Doppler    CXR  01/31/2014    1. The tip of the left IJ approach central venous catheter overlies the left innominate vein. 2. Aortic atherosclerosis. 3. No acute cardiopulmonary process.     EKG  normal sinus rhythm. For complete results please see formal report.   Therapy Recommendations   Physical Exam   Pleasant elderly caucasian lady not in distress.Awake alert. Afebrile. Head is nontraumatic. Neck is supple without bruit. Hearing is normal. Cardiac exam no murmur or gallop. Lungs are clear to auscultation. Distal pulses are well felt. Neurological Exam :  Awake  Alert oriented x 3. Normal speech and language.eye movements full without nystagmus.fundi were not visualized. Vision acuity and fields appear normal. Hearing is normal. Palatal movements are normal. Face symmetric. Tongue midline. Normal strength, tone, reflexes and coordination. Normal sensation. Gait deferred. ASSESSMENT [name] is a 73 y.o. female presenting with dizziness, generalized weakness, nausea. Imaging confirms a right PICA infarct with cerebral cytotoxic edema and mass effect on the 4th ventricle. Increasing hydrocephalus on CT this am. Infarct felt to be embolic secondary to right PICA occlusion due to unknown etiology, suspicious for embolic source.  On no antithrombotics prior to  admission. Now on aspirin 325 mg orally every day for secondary stroke prevention. Patient with significant dizziness. Stroke work up underway.  Transferred to the ICU after admission due to increasing lethargy.   Induced hypernatremia with 3% protocol in order to decrease cerebral edema. Na 142 hypertension Hyperlipidemia, LDL 63.4, on no statin PTA, now on no statin, at goal LDL < 100 Patient reports Urinary urgency, frequency. UA neg  Hospital day # 1  TREATMENT/PLAN  Continue ICU monitoring for now  Continue aspirin 325 mg orally every day for secondary stroke prevention.  Continue 3% to goal Na of 155. Turn off once reaches 155 and do not resume.   Keep  in bed today  Increase diet  Plan TEE and loop for Monday. I have made NPO after midnight.  F/u 2D, Echo   Annie Main, MSN, RN, ANVP-BC, AGPCNP-BC [facility] Pager: [phone] 02/01/2014 8:28 AM This patient is critically ill and at significant risk of neurological worsening, death and care requires constant monitoring of vital signs, hemodynamics,respiratory and cardiac monitoring,review of multiple databases, neurological assessment, discussion with family, other specialists and medical decision making of high complexity. I spent 30 minutes of neurocritical care time  in the care of  this patient. I have personally obtained a history, examined the patient, evaluated imaging results, and formulated the assessment and plan of care. I agree with the above. Delia Heady, MD   To contact Stroke Continuity provider, please refer to [email or web address]. After hours, contact General Neurology

## 2014-02-01 NOTE — Progress Notes (Signed)
OT Cancellation Note  Patient Details Name: Tammy Barron MRN: [id number] DOB: August 01, 1941   Cancelled Treatment:    Reason Eval/Treat Not Completed: Patient not medically ready  [facility]binWARD,HILLARY Feleshia Zundel, OTR/L  [phone] 02/01/2014 02/01/2014, 12:02 PM

## 2014-02-01 NOTE — Evaluation (Signed)
Speech Language Pathology Evaluation Patient Details Name: Tammy ClarksMargaret L Barron MRN: [id number] DOB: 07-Nov-1940 Today's Date: 02/01/2014 Time: [id number] SLP Time Calculation (min): 17 min  Problem List:  Patient Active Problem List   Diagnosis Date Noted  . Obstructive hydrocephalus 02/01/2014  . ICH (intracerebral hemorrhage) 02/01/2014  . Cerebellar stroke 01/31/2014  . ABDOMINAL PAIN 08/22/2009  . KNEE PAIN, LEFT, CHRONIC 02/21/2008  . HYPERLIPIDEMIA 12/07/2007  . ANXIETY 12/07/2007  . DEPRESSION 12/07/2007  . HYPERTENSION 12/07/2007   Past Medical History:  Past Medical History  Diagnosis Date  . Anxiety   . Depression   . Hypertension   . Hyperlipidemia   . Hx of abnormal Pap smear    Past Surgical History:  Past Surgical History  Procedure Laterality Date  . Abdominal hysterectomy  1982   HPI:  73 y.o. female Right-handed Caucasian female with History of hypertension, anxiety and depression. States that yesterday morning she developed dizziness which persisted until today. CT revealed acute right PICA infarct in the cerebellum without hemorrhage.    Assessment / Plan / Recommendation Clinical Impression  Order received 4/2 for speech-cognitive assessment for pt. demonstrating intact cognition with basic/verbal information.  SLP recommends continued diagnostic evaluation of executive functions as pt. lives alone without family assist (per pt.).  SLP will continue to facilitate cognitive independence.    SLP Assessment  Patient needs continued Speech Lanaguage Pathology Services    Follow Up Recommendations   (TBA)    Frequency and Duration min 2x/week  2 weeks   Pertinent Vitals/Pain WDL   SLP Goals  SLP Goals Potential to Achieve Goals: Good  SLP Evaluation Prior Functioning  Cognitive/Linguistic Baseline: Information not available  Lives With: Alone Available Help at Discharge:  (no family to assist per pt.  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  Overall Cognitive Status: Impaired/Different from baseline Arousal/Alertness: Awake/alert (drowsy) Orientation Level: Oriented X4 Attention: Sustained Sustained Attention: Appears intact Memory:  (to assess further) Awareness: Appears intact Problem Solving: Impaired Problem Solving Impairment: Verbal basic Safety/Judgment: Appears intact    Comprehension  Auditory Comprehension Overall Auditory Comprehension: Appears within functional limits for tasks assessed Visual Recognition/Discrimination Discrimination: Not tested Reading Comprehension Reading Status:  (TBA)    Expression Expression Primary Mode of Expression: Verbal Verbal Expression Overall Verbal Expression: Appears within functional limits for tasks assessed Initiation: No impairment Level of Generative/Spontaneous Verbalization: Sentence Pragmatics: No impairment Written Expression Dominant Hand: Right Written Expression: Within Functional Limits (decreased legibility)   Oral / Motor Oral Motor/Sensory Function Overall Oral Motor/Sensory Function: Impaired Labial ROM: Reduced left Labial Symmetry: Abnormal symmetry left Lingual ROM: Within Functional Limits Lingual Symmetry: Within Functional Limits Lingual Strength: Within Functional Limits Facial ROM: Within Functional Limits Facial Symmetry: Within Functional Limits Facial Strength: Within Functional Limits Velum: Within Functional Limits Mandible: Within Functional Limits Motor Speech Overall Motor Speech: Appears within functional limits for tasks assessed Respiration: Within functional limits Phonation: Normal Resonance: Within functional limits Articulation: Within functional limitis Intelligibility: Intelligible Motor Planning: Witnin functional limits        [employer]Lisa Willis Laylana Gerwig M.Ed [employer]-SLP Pager [phone]  02/01/2014

## 2014-02-02 DIAGNOSIS — I10 Essential (primary) hypertension: Secondary | ICD-10-CM

## 2014-02-02 DIAGNOSIS — E785 Hyperlipidemia, unspecified: Secondary | ICD-10-CM

## 2014-02-02 DIAGNOSIS — I4891 Unspecified atrial fibrillation: Secondary | ICD-10-CM

## 2014-02-02 LAB — BASIC METABOLIC PANEL
BUN: 7 mg/dL (ref 6–23)
CALCIUM: 7.9 mg/dL — AB (ref 8.4–10.5)
CHLORIDE: 107 meq/L (ref 96–112)
CO2: 26 mEq/L (ref 19–32)
Creatinine, Ser: 0.5 mg/dL (ref 0.50–1.10)
GFR calc Af Amer: >90 mL/min (ref >90)
GFR calc non Af Amer: >90 mL/min (ref >90)
Glucose, Bld: 101 mg/dL — ABNORMAL HIGH (ref 70–99)
Potassium: 2.9 mEq/L — CL (ref 3.7–5.3)
Sodium: 146 mEq/L (ref 137–147)

## 2014-02-02 LAB — SODIUM
Sodium: 143 mEq/L (ref 137–147)
Sodium: 141 mEq/L (ref 137–147)
Sodium: 146 mEq/L (ref 137–147)
Sodium: 143 mEq/L (ref 137–147)

## 2014-02-02 LAB — MAGNESIUM: Magnesium: 1.7 mg/dL (ref 1.5–2.5)

## 2014-02-02 MED ORDER — GUAIFENESIN ER 600 MG PO TB12
600.0000 mg | ORAL_TABLET | Freq: Two times a day (BID) | ORAL | Status: DC
  Administered 2014-02-02 – 2014-02-06 (×9): 600 mg via ORAL
  Filled 2014-02-02 (×10): qty 1

## 2014-02-02 MED ORDER — DILTIAZEM LOAD VIA INFUSION
10.0000 mg | Freq: Once | INTRAVENOUS | Status: AC
Start: 2014-02-02 — End: 2014-02-02
  Administered 2014-02-02: 10 mg via INTRAVENOUS
  Filled 2014-02-02: qty 10

## 2014-02-02 MED ORDER — POTASSIUM CHLORIDE 10 MEQ/50ML IV SOLN
10.0000 meq | INTRAVENOUS | Status: AC
  Administered 2014-02-02 (×4): 10 meq via INTRAVENOUS
  Filled 2014-02-02 (×2): qty 50

## 2014-02-02 MED ORDER — ONDANSETRON HCL 4 MG/2ML IJ SOLN
INTRAMUSCULAR | Status: AC
  Filled 2014-02-02: qty 2

## 2014-02-02 MED ORDER — ONDANSETRON HCL 4 MG/2ML IJ SOLN: 4.0000 mg | Freq: Four times a day (QID) | INTRAMUSCULAR | Status: DC

## 2014-02-02 MED ORDER — ONDANSETRON HCL 4 MG/2ML IJ SOLN
4.0000 mg | Freq: Four times a day (QID) | INTRAMUSCULAR | Status: DC | PRN
  Administered 2014-02-03: 4 mg via INTRAVENOUS
  Filled 2014-02-02: qty 2

## 2014-02-02 MED ORDER — DILTIAZEM HCL 100 MG IV SOLR
5.0000 mg/h | INTRAVENOUS | Status: DC
  Administered 2014-02-02 – 2014-02-04 (×3): 5 mg/h via INTRAVENOUS
  Filled 2014-02-02 (×4): qty 100

## 2014-02-02 MED ORDER — SIMVASTATIN 20 MG PO TABS
20.0000 mg | ORAL_TABLET | Freq: Every day | ORAL | Status: DC
  Administered 2014-02-02: 20 mg via ORAL
  Filled 2014-02-02 (×2): qty 1

## 2014-02-02 MED ORDER — ALBUTEROL SULFATE (2.5 MG/3ML) 0.083% IN NEBU: 2.5000 mg | INHALATION_SOLUTION | RESPIRATORY_TRACT | Status: DC | PRN

## 2014-02-02 MED ORDER — FUROSEMIDE 10 MG/ML IJ SOLN
20.0000 mg | Freq: Once | INTRAMUSCULAR | Status: AC
  Administered 2014-02-02: 20 mg via INTRAVENOUS
  Filled 2014-02-02: qty 2

## 2014-02-02 NOTE — Progress Notes (Signed)
Progress Note [place] TEAM 1 - Stepdown/ICU TEAM  [name] WUJ:[id number] DOB: [birth year] DOA: 01/31/2014 PCP: Rogelia BogaKWIATKOWSKI,PETER FRANK, MD  Brief narrative: 73 y.o. female who presented on 01/31/2014 with a history of Anxiety; Depression; Hypertension; Hyperlipidemia, and tobacco abuse w/ c/o a few days of "dizziness" which was c/w vertigo. She was found to have a right PICA infarct with mass effect and hydrocephalus. Right PICA noted to be occluded - suspected by Neurology to be an embolic event.  The pt was transferred to the ICU after admission due to increasing lethargy.  Subjective: Pt is alert and conversant.  She c/o mild HA, but is troubled mostly by persisting nausea.  She also c/o cough and "rattling" in her chest.  She admits to smoking 1/2 to 1 ppd cigarettes.    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 - as mentioned above, looking for an embolic source - f/u TTE - Neuro plans for TEE Monday, with implantation of loop to r/o afib/arrythmia - 3% saline + full dose ASA per Neuro  HYPERLIPIDEMIA - statin started this admission  ANXIETY - PRN Tranxene - appears reasonably managed at this time  Tobacco abuse - prn nebs + IS + flutter valve - counseled on need to d/c tobacco   HYPERTENSION - Stroke Team allowing permissive HTN - Ziac on hold - will defer to Neuro   Code Status: NO CPR/cardioversion per Dr Alene MiresKirkpatrick's discussion with pt - see his note  Family Communication: spoke w/ son (a Armed forces operational officerdermatologist) at bedside  Disposition Plan: Follow in ICU due to hypertonic saline   Consultants: Stroke Team   Procedures: TTE - 4/2 - EF 55% to 60% - wall motion was normal - grade 1 diastolic dysfunction - no evidence of embolic source   Carotid dopplers - pending   Antibiotics: None  DVT prophylaxis: SQ Heparin  Objective: Filed Weights   01/31/14 1900  Weight: 86.3 kg (190 lb 4.1 oz)   Blood pressure 173/64, pulse 70,  temperature 97.6 F (36.4 C), temperature source Axillary, resp. rate 24, height 5' 4.5" (1.638 m), weight 86.3 kg (190 lb 4.1 oz), SpO2 96.00%.  Intake/Output Summary (Last 24 hours) at 02/02/14 1008 Last data filed at 02/02/14 0900  Gross per 24 hour  Intake 2908.33 ml  Output   1855 ml  Net 1053.33 ml   Exam: General: no acute respiratory distress - alert and conversant  Lungs: course crackles th/o all fields - no active wheeze  Cardiovascular: regular rate and rhythm without murmur gallop or rub normal S1 and S2 Abdomen: nontender, nondistended, soft, bowel sounds positive, no rebound, no ascites, no appreciable mass Extremities: no significant cyanosis, clubbing, or edema bilateral lower extremities  Data Reviewed: Basic Metabolic Panel:  Recent Labs Lab 01/31/14 1112 01/31/14 2255  02/01/14 1435 02/01/14 2120 02/02/14 0250 02/02/14 0800 02/02/14 0910  NA 136* 139  < > 145 143 143 141 146  K 3.4*  --   --   --   --   --   --   --   CL 94*  --   --   --   --   --   --   --   CO2 24  --   --   --   --   --   --   --   GLUCOSE 171*  --   --   --   --   --   --   --  BUN 29*  --   --   --   --   --   --   --   CREATININE 0.69 0.66  --   --   --   --   --   --   CALCIUM 9.8  --   --   --   --   --   --   --   < > = values in this interval not displayed.  Liver Function Tests:  Recent Labs Lab 01/31/14 1112  AST 20  ALT 12  ALKPHOS 52  BILITOT 0.5  PROT 7.6  ALBUMIN 3.6    Recent Labs Lab 01/31/14 1112  LIPASE 16   CBC:  Recent Labs Lab 01/31/14 1112 01/31/14 2255  WBC 12.1* 12.2*  NEUTROABS 10.7*  --   HGB 15.4* 13.5  HCT 43.4 38.4  MCV 87.3 88.7  PLT 198 181    Recent Results (from the past 240 hour(s))  MRSA PCR SCREENING     Status: None   Collection Time    01/31/14  7:00 PM      Result Value Ref Range Status   MRSA by PCR NEGATIVE  NEGATIVE Final   Comment:            The GeneXpert MRSA Assay (FDA     approved for NASAL specimens      only), is one component of a     comprehensive MRSA colonization     surveillance program. It is not     intended to diagnose MRSA     infection nor to guide or     monitor treatment for     MRSA infections.     Studies:  Recent x-ray studies have been reviewed in detail by the Attending Physician  Scheduled Meds:  Scheduled Meds: .  stroke: mapping our early stages of recovery book   Does not apply Once  . aspirin  300 mg Rectal Daily   Or  . aspirin  325 mg Oral Daily  . cycloSPORINE  1 drop Both Eyes BID  . furosemide  20 mg Intravenous Once  . heparin  5,000 Units Subcutaneous 3 times per day  . multivitamin with minerals  1 tablet Oral Daily   Continuous Infusions: . sodium chloride (hypertonic) 75 mL/hr at 02/02/14 0900    Time spent on care of this patient: 35 mins   Chantee Cerino T, MD  [facility] Office  [phone] Pager - Text Page per Loretha Stapler as per below:  On-Call/Text Page:      [email or web address]  If 7PM-7AM, please contact night-coverage [email or web address] 02/02/2014, 10:08 AM   LOS: 2 days

## 2014-02-02 NOTE — Evaluation (Addendum)
Physical Therapy Evaluation Patient Details Name: Tammy Barron MRN: [id number] DOB: 03/23/1941 Today's Date: 02/02/2014   History of Present Illness  Past medical history of Anxiety; Depression; Hypertension; Hyperlipidemia, tobacco abuse who presented with a few days of "dizziness". 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   Clinical Impression  Pt adm due to the above. Presents with significant decline in functional mobility secondary to deficits indicated below. Pt to benefit from skilled acute PT to address deficits and maximize mobility prior to D/C. Recommend CIR when medically ready for D/C. Pt was independent prior to admission and has good family support. Her son is a physician [facility]     Follow Up Recommendations CIR    Equipment Recommendations  Other (comment) (TBD)    Recommendations for Other Services OT consult;Rehab consult     Precautions / Restrictions Precautions Precautions: Fall;Other (comment) Restrictions Weight Bearing Restrictions: No      Mobility  Bed Mobility Overal bed mobility: Needs Assistance Bed Mobility: Supine to Sit     Supine to sit: Mod assist     General bed mobility comments: mild trunk ataxia; cues for sequencing; pt impulsive; cues for safety   Transfers Overall transfer level: Needs assistance Equipment used: 2 person hand held assist Transfers: Sit to/from Stand;Stand Pivot Transfers Sit to Stand: +2 physical assistance;Mod assist Stand pivot transfers: +2 physical assistance;Mod assist       General transfer comment: pt very dizzy with transfers; 2 person hand held (A) for safety; attempted pre-gt acitivities and pt with poor motor planning with hip flexion; shuffled pivotal steps to chair with 2 person handheld (A)   Ambulation/Gait             General Gait Details: unable to assess due to dizziness  Stairs            Wheelchair Mobility    Modified Rankin (Stroke Patients  Only)       Balance Overall balance assessment: Needs assistance Sitting-balance support: Feet supported;No upper extremity supported Sitting balance-Leahy Scale: Fair Sitting balance - Comments: eyes open but pt swaying sitting EOB   Standing balance support: During functional activity;Bilateral upper extremity supported Standing balance-Leahy Scale: Poor Standing balance comment: pt swaying with standing; requires (A) to maintain balance                              Pertinent Vitals/Pain C/o dizziness; BP elevated; under parameters.     Home Living Family/patient expects to be discharged to:: Inpatient rehab Living Arrangements: Alone Available Help at Discharge: Other (Comment)             Additional Comments: Has DME - RW, BSC, shower chair, lift chair    Prior Function Level of Independence: Independent         Comments: son present during session and is a Medical illustratorphysican      Hand Dominance   Dominant Hand: Right    Extremity/Trunk Assessment   Upper Extremity Assessment: Defer to OT evaluation           Lower Extremity Assessment: LLE deficits/detail   LLE Deficits / Details: Lt LE decreased coordination with ataxia   Cervical / Trunk Assessment: Other exceptions (trunk ataxia )  Communication   Communication: No difficulties  Cognition Arousal/Alertness: Awake/alert Behavior During Therapy: Impulsive Overall Cognitive Status: Impaired/Different from baseline Area of Impairment: Orientation;Problem solving Orientation Level: Time (year and month )  Problem Solving: Slow processing;Requires verbal cues;Requires tactile cues      General Comments General comments (skin integrity, edema, etc.): c/o pain in Lt arm where blood pressure cuff is; RN notifeid     Exercises        Assessment/Plan    PT Assessment Patient needs continued PT services  PT Diagnosis Difficulty walking;Generalized weakness   PT Problem List  Decreased balance;Decreased mobility;Decreased coordination;Decreased cognition;Decreased safety awareness;Decreased knowledge of use of DME;Decreased activity tolerance  PT Treatment Interventions DME instruction;Gait training;Functional mobility training;Therapeutic activities;Therapeutic exercise;Balance training;Neuromuscular re-education;Patient/family education   PT Goals (Current goals can be found in the Care Plan section) Acute Rehab PT Goals Patient Stated Goal: to get better and be independent PT Goal Formulation: With patient Time For Goal Achievement: 02/16/14 Potential to Achieve Goals: Good    Frequency Min 4X/week   Barriers to discharge Decreased caregiver support lived alone    Co-evaluation               End of Session Equipment Utilized During Treatment: Gait belt Activity Tolerance: Patient limited by fatigue;Other (comment) (dizziness) Patient left: in chair;with call bell/phone within reach;with family/visitor present Nurse Communication: Mobility status;Precautions;Other (comment) (WB )         Time: 4098-1191 PT Time Calculation (min): 17 min   Charges:   PT Evaluation $Initial PT Evaluation Tier I: 1 Procedure PT Treatments $Therapeutic Activity: 8-22 mins   PT G CodesDonell Sievert, [place] [phone] 02/02/2014, 1:04 PM

## 2014-02-02 NOTE — Progress Notes (Signed)
Was notified by lab of critical value - K+ of 2.9.  Dr. Noel Christmasharles Stewart was paged and said to follow the replacement protocol.  [facility]  Dr. Craige CottaSood will put in orders for IV K+ replacement. Tammy Barron C

## 2014-02-02 NOTE — Progress Notes (Signed)
Stroke Team Progress Note  HISTORY Tammy Barron is a 73 y.o. female with a history of hypertension, hyperlipidemia, and tobacco abuse who presented 01/31/2014 with 2 days of dizziness. She stated that she tried to get out of bed Tuesday morning and she had difficulty walking. She had persistent vertigo and nausea. On the day of admission it was not any better and therefore she sought further treatment in the emergency room.   LKW: Monday 3/30 prior to bedtime.  tpa given?: no, outside of window   SUBJECTIVE The patient's son who is a physician was at the bedside. The patient reported that she was still experiencing some dizziness as well as a cough. Dr. Pearlean Brownie recommended keeping the patient in the unit for now. He reviewed the MRI/MRA findings with the patient's son.  OBJECTIVE Most recent Vital Signs: Filed Vitals:   02/02/14 0530 02/02/14 0600 02/02/14 0645 02/02/14 0740  BP: 178/71 184/77 173/64   Pulse: 76 75 70   Temp:    97.6 F (36.4 C)  TempSrc:    Axillary  Resp:   24   Height:      Weight:      SpO2: 97% 97% 96%    CBG (last 3)  No results found for this basename: GLUCAP,  in the last 72 hours  IV Fluid Intake:   . sodium chloride (hypertonic) 125 mL/hr at 02/02/14 4098    MEDICATIONS  .  stroke: mapping our early stages of recovery book   Does not apply Once  . aspirin  300 mg Rectal Daily   Or  . aspirin  325 mg Oral Daily  . cycloSPORINE  1 drop Both Eyes BID  . heparin  5,000 Units Subcutaneous 3 times per day  . multivitamin with minerals  1 tablet Oral Daily   PRN:  clorazepate, senna-docusate  Diet:  Cardiac thin liquids Activity:  Up with assistance DVT Prophylaxis:  Subcutaneous heparin  CLINICALLY SIGNIFICANT STUDIES Basic Metabolic Panel:  Recent Labs Lab 01/31/14 1112 01/31/14 2255  02/01/14 2120 02/02/14 0250  NA 136* 139  < > 143 143  K 3.4*  --   --   --   --   CL 94*  --   --   --   --   CO2 24  --   --   --   --   GLUCOSE 171*   --   --   --   --   BUN 29*  --   --   --   --   CREATININE 0.69 0.66  --   --   --   CALCIUM 9.8  --   --   --   --   < > = values in this interval not displayed. Liver Function Tests:  Recent Labs Lab 01/31/14 1112  AST 20  ALT 12  ALKPHOS 52  BILITOT 0.5  PROT 7.6  ALBUMIN 3.6   CBC:  Recent Labs Lab 01/31/14 1112 01/31/14 2255  WBC 12.1* 12.2*  NEUTROABS 10.7*  --   HGB 15.4* 13.5  HCT 43.4 38.4  MCV 87.3 88.7  PLT 198 181   Coagulation:  Recent Labs Lab 01/31/14 1233  LABPROT 13.3  INR 1.03   Cardiac Enzymes: No results found for this basename: CKTOTAL, CKMB, CKMBINDEX, TROPONINI,  in the last 168 hours Urinalysis:  Recent Labs Lab 01/31/14 1444 02/01/14 1405  COLORURINE YELLOW YELLOW  LABSPEC 1.022 1.021  PHURINE 5.5 6.0  GLUCOSEU NEGATIVE NEGATIVE  HGBUR NEGATIVE NEGATIVE  BILIRUBINUR NEGATIVE NEGATIVE  KETONESUR 40* NEGATIVE  PROTEINUR 30* NEGATIVE  UROBILINOGEN 0.2 0.2  NITRITE NEGATIVE NEGATIVE  LEUKOCYTESUR NEGATIVE NEGATIVE   Lipid Panel    Component Value Date/Time   CHOL 209* 02/01/2014 0450   TRIG 189* 02/01/2014 0450   HDL 45 02/01/2014 0450   CHOLHDL 4.6 02/01/2014 0450   VLDL 38 02/01/2014 0450   LDLCALC 126* 02/01/2014 0450   HgbA1C  Lab Results  Component Value Date   HGBA1C 5.3 02/01/2014    Urine Drug Screen:   No results found for this basename: labopia, cocainscrnur, labbenz, amphetmu, thcu, labbarb    Alcohol Level: No results found for this basename: ETH,  in the last 168 hours  Ct Head Wo Contrast 02/01/2014    Evolving right posterior inferior cerebellar artery territory infarct with worsening posterior fossa mass effect, increasing hydrocephalus. No hemorrhagic conversion. Effaced pre pontine cistern with mass effect on the basilar artery.     Ct Head Wo Contrast 01/31/2014    Extensive right cerebellar edema causing mass effect and effacement on the fourth ventricle and relative hydrocephalus of the lateral and third  ventricles. The appearance in the right cerebellum is concerning for potential mass, although in acute infarct could present similarly. There may be some hemorrhage along the periphery of the right cerebellum in this lesion. No other hemorrhage seen. MR correlation with and without intravenous contrast material advised.  Elsewhere, there is mild small vessel disease in the centra semiovale bilaterally. The lateral and third ventricle are midline in location.    Mri Head Wo Contrast 01/31/2014    Acute right PICA infarct in the cerebellum without hemorrhage. There is edema and mass-effect on the fourth ventricle without hydrocephalus.  There is generalized atrophy.  Negative for hemorrhage or mass.      Mra Brain W Wo Contrast 01/31/2014    Small and irregular distal right vertebral artery with occlusion of the right PICA consistent with acute infarct.       Dg Chest Port 1 View 01/31/2014    1. The tip of the left IJ approach central venous catheter overlies the left innominate vein. 2. Aortic atherosclerosis. 3. No acute cardiopulmonary process.      2D Echocardiogram  - ejection fraction 55-60%. Grade 1 diastolic dysfunction. No cardiac source of emboli identified.  Carotid Doppler  - Preliminary report: Right: 60-79% ICA stenosis. Left: Unable to visualize due to central line/dressings. Right vertebral retrograde. Left vertebral antegrade.    EKG sinus rhythm rate 83 beats per minute. For complete results please see formal report.   Therapy Recommendations pending  Physical Exam   Mental Status:  Patient is awake and alert. Immediate and remote memory are intact.  Patient is able to give a clear and coherent history.  No signs of aphasia or neglect  Cranial Nerves:  II: Visual Fields are full. Pupils are equal, round, and reactive to light. Discs are difficult to visualize.  III,IV, VI: EOMI without ptosis or diploplia.  V: Facial sensation is symmetric to temperature  VII: Facial  movement is symmetric.  VIII: hearing is intact to voice  X: Uvula elevates symmetrically  XI: Shoulder shrug is symmetric.  XII: tongue is midline without atrophy or fasciculations.  Motor:  Tone is normal. Bulk is normal. 5/5 strength was present in all four extremities.  Sensory:  Sensation is symmetric to light touch and temperature in the arms and legs.  Deep Tendon Reflexes:  2+ and symmetric  in the biceps and patellae.  Plantars:  Toes are downgoing bilaterally.  Cerebellar:  FNF and HKS are intact on left, dysmetria present on right.  Gait:  Not tested.   ASSESSMENT Tammy Barron is a 73 y.o. female presenting with dizziness. TPA was not given secondary to delay in arrival.  An MRI revealed  an acute right PICA infarct in the cerebellum without hemorrhage. Infarct felt to be embolic or thrombotic of unknown etiology.  On no antithrombotic prior to admission. Now on aspirin 325 mg orally every day for secondary stroke prevention. Patient with resultant continued dizziness. Stroke work up underway.   Hypokalemia  Hyponatremia  Hypertension history  Right ICA stenosis 60-79% ICA by Doppler.  Cough - rales at bases.  Hyperlipidemia - cholesterol 209; LDL 126  Hemoglobin A1c 5.3  Tobacco history   Hospital day # 2  TREATMENT/PLAN  Continue aspirin 325 mg orally every day for secondary stroke prevention.  Will need TEE and loop to rule out embolic etiology for stroke - scheduled for Monday.  Add statin  Continue hypertonic saline for hyponatremia - 20 mg IV Lasix today. Sodium 146 today.  Head CT and 2 view chest x-ray Saturday a.m.  Check bmet in a.m.  Risk factor modification - tobacco cessation.  Further evaluation of right internal carotid artery stenosis   Delton See PA-C [facility] Pager [phone] 02/02/2014, 2:12 PM This patient is critically ill and at significant risk of neurological worsening, death and care  requires constant monitoring of vital signs, hemodynamics,respiratory and cardiac monitoring,review of multiple databases, neurological assessment, discussion with family, other specialists and medical decision making of high complexity. I spent 30 minutes of neurocritical care time  in the care of  this patient. I have personally obtained a history, examined the patient, evaluated imaging results, and formulated the assessment and plan of care. I agree with the above.  Delia Heady, MD  To contact Stroke Continuity provider, please refer to [email or web address]. After hours, contact General Neurology

## 2014-02-02 NOTE — Progress Notes (Signed)
Occupational Therapy Evaluation Patient Details Name: Tammy Barron MRN: [id number] DOB: 20-Feb-1941 Today's Date: 02/02/2014    History of Present Illness Past medical history of Anxiety; Depression; Hypertension; Hyperlipidemia, tobacco abuse who presented with a few days of "dizziness". 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  MRI -Acute infarct in the right cerebellum involving the right PICA territory. This involves a large amount of the right cerebellum, essentially the entire PICA territory and also some of the AICA.    Clinical Impression   PTA, pt lived alone and was independent with all ADL and mobility. Pt with significant functional decline, requiring Max A with ADL and mobility and would benefit from CIR to facilitate independence with ADL and mobility. suuportive family lives out of town. Pt will benefit from skilled OT services to facilitate D/C to next venue due to below deficits.    Follow Up Recommendations  CIR;Supervision/Assistance - 24 hour    Equipment Recommendations  None recommended by OT    Recommendations for Other Services Rehab consult     Precautions / Restrictions Precautions Precautions: Fall;Other (comment) (dizziness) Restrictions Weight Bearing Restrictions: No      Mobility Bed Mobility Overal bed mobility: Needs Assistance Bed Mobility: Supine to Sit;Sit to Supine     Supine to sit: Mod assist Sit to supine: Mod assist      Transfers Overall transfer level: Needs assistance   Transfers: Sit to/from Stand;Stand Pivot Transfers Sit to Stand: Mod assist Stand pivot transfers: Max assist;+2 safety/equipment       General transfer comment: poor postural control in standing    Balance Overall balance assessment: Needs assistance Sitting-balance support: Feet supported;No upper extremity supported Sitting balance-Leahy Scale: Fair Sitting balance - Comments: keeps eyes closed   Standing balance support:  Bilateral upper extremity supported;During functional activity Standing balance-Leahy Scale: Zero Standing balance comment: poor postural control in standing. +sway all directions                            ADL Overall ADL's : Needs assistance/impaired Eating/Feeding: Set up   Grooming: Minimal assistance;Sitting   Upper Body Bathing: Moderate assistance;Sitting   Lower Body Bathing: Maximal assistance;Sit to/from stand   Upper Body Dressing : Moderate assistance;Sitting   Lower Body Dressing: Sit to/from stand;+2 for physical assistance;Maximal assistance   Toilet Transfer: Maximal assistance;Stand-pivot;BSC Toilet Transfer Details (indicate cue type and reason): pt kept close to therapists body to increase trunk support due to ataxia   Toileting - Clothing Manipulation Details (indicate cue type and reason): Max     Functional mobility during ADLs: Maximal assistance;+2 for safety/equipment General ADL Comments: Pt with improved performance with support. Began education on using focal point to decrease c/o dizziness during mobility     Vision Eye Alignment: Within Functional Limits Alignment/Gaze Preference: Within Defined Limits (keeps eyes closed) Ocular Range of Motion: Within Functional Limits Tracking/Visual Pursuits: Decreased smoothness of horizontal tracking Saccades: Additional eye shifts occurred during testing;Decreased speed of saccadic movement Convergence: Within functional limits   Depth Perception: Undershoots Additional Comments: + nystagmus   Perception Perception Perception Tested?: Yes Comments: no deficits   Praxis Praxis Praxis tested?: Within functional limits    Pertinent Vitals/Pain No c/o pain BP 169/71 sitting RR 26-28 Dyspnea 3/4     Hand Dominance Right   Extremity/Trunk Assessment Upper Extremity Assessment Upper Extremity Assessment: LUE deficits/detail LUE Deficits / Details: mild ataxia LUE Coordination:  decreased gross motor  Lower Extremity Assessment Lower Extremity Assessment: Defer to PT evaluation   Cervical / Trunk Assessment Cervical / Trunk Assessment: Other exceptions (trunkal ataxia)   Communication Communication Communication: No difficulties   Cognition Arousal/Alertness: Awake/alert Behavior During Therapy: WFL for tasks assessed/performed (great humor) Overall Cognitive Status: Impaired/Different from baseline Area of Impairment: Orientation;Problem solving Orientation Level: Disoriented to;Time           Problem Solving: Slow processing     General Comments       Exercises Exercises: Other exercises Other Exercises Other Exercises: visual fixation during movement;limited head movement   Shoulder Instructions      Home Living Family/patient expects to be discharged to:: Inpatient rehab Living Arrangements: Alone Available Help at Discharge: Other (Comment) (option of going to [place] to live with son)                             Additional Comments: Has DME - RW, BSC, shower chair, lift chair  Lives With: Alone    Prior Functioning/Environment Level of Independence: Independent             OT Diagnosis: Generalized weakness;Disturbance of vision;Ataxia   OT Problem List: Decreased strength;Decreased activity tolerance;Impaired balance (sitting and/or standing);Impaired vision/perception;Decreased safety awareness;Decreased knowledge of use of DME or AE;Decreased knowledge of precautions;Cardiopulmonary status limiting activity;Impaired UE functional use   OT Treatment/Interventions: Self-care/ADL training;Therapeutic exercise;Neuromuscular education;Energy conservation;DME and/or AE instruction;Therapeutic activities;Visual/perceptual remediation/compensation;Patient/family education;Balance training    OT Goals(Current goals can be found in the care plan section) Acute Rehab OT Goals Patient Stated Goal: to get better and be  independent OT Goal Formulation: With patient Time For Goal Achievement: 02/16/14 Potential to Achieve Goals: Good  OT Frequency: Min 3X/week   Barriers to D/C: Decreased caregiver support (sonlives in [place])          Co-evaluation              End of Session Equipment Utilized During Treatment: Gait belt Nurse Communication: Mobility status;Precautions  Activity Tolerance: Patient tolerated treatment well Patient left: in chair;with call bell/phone within reach;with family/visitor present   Time: 4540-9811 OT Time Calculation (min): 52 min Charges:  OT General Charges $OT Visit: 1 Procedure OT Evaluation $Initial OT Evaluation Tier I: 1 Procedure OT Treatments $Self Care/Home Management : 38-52 mins G-Codes:    Markeese Boyajian,HILLARY Feb 28, 2014, 10:35 AM   Luisa Dago, OTR/L  [phone] 02/28/14

## 2014-02-02 NOTE — Progress Notes (Signed)
eLink Physician-Brief Progress Note Patient Name: Tammy Barron DOB: Apr 28, 1941 MRN: [id number]  Date of Service  02/02/2014   HPI/Events of Note   Pt with new onset A fib.  eICU Interventions  Will start cardizem gtt.   Intervention Category Major Interventions: Arrhythmia - evaluation and management  Mauro Arps 02/02/2014, 8:00 PM

## 2014-02-02 NOTE — Progress Notes (Signed)
Rehab Admissions Coordinator Note:  Patient was screened by [name] for appropriateness for an Inpatient Acute Rehab Consult.  At this time, we are recommending Inpatient Rehab consult.  [name], PT Rehabilitation Admissions Coordinator [phone]  

## 2014-02-02 NOTE — Progress Notes (Signed)
eLink Physician-Brief Progress Note Patient Name: Tammy ClarksMargaret L Colantuono DOB: [birth year] MRN: [id number]  Date of Service  02/02/2014   HPI/Events of Note   Lab Results  Component Value Date   CREATININE 0.50 02/02/2014   BUN 7 02/02/2014   NA 146 02/02/2014   K 2.9* 02/02/2014   CL 107 02/02/2014   CO2 26 02/02/2014     eICU Interventions  Will order KCL 10 meq IV x 4 runs. F/u BMET and Mg in AM.   Intervention Category Intermediate Interventions: Other:  Anadia Helmes 02/02/2014, 6:16 PM

## 2014-02-02 NOTE — Progress Notes (Signed)
VASCULAR LAB PRELIMINARY  PRELIMINARY  PRELIMINARY  PRELIMINARY  Carotid duplex  completed.    Preliminary report:  Right:  60-79% ICA stenosis.  Left:  Unable to visualize due to central line/dressings.  Right vertebral retrograde.  Left vertebral antegrade.  Tammy Barron, RVT 02/02/2014, 1:16 PM

## 2014-02-02 NOTE — Progress Notes (Signed)
CRITICAL VALUE ALERT  Critical value received:  K+ 2.9  Date of notification:  02/02/2014  Time of notification:  1800 Critical value read back:yes  Nurse who received alert:  Berniece Paponnie Dolores Ewing, RN  MD notified (1st page):  Dr. Noel Christmasharles Stewart Time of first page:  1800  MD notified (2nd page):  Time of second page:  Responding MD:  Dr. Noel Christmasharles Stewart  Time MD responded:  [phone]

## 2014-02-02 NOTE — Progress Notes (Signed)
Called Dr. Roseanne RenoStewart regarding patient's new onset nausea/vomiting and A.Fib with rates in the 120s and 130s.   An order was given for IV Zofran 4mg  q 6 hrs.  Stat labs for Magnesium and BMP were ordered.   Goran Olden C

## 2014-02-03 ENCOUNTER — Inpatient Hospital Stay (HOSPITAL_COMMUNITY): Payer: Medicare Other

## 2014-02-03 LAB — SODIUM
Sodium: 147 mEq/L (ref 137–147)
Sodium: 149 mEq/L — ABNORMAL HIGH (ref 137–147)
Sodium: 148 mEq/L — ABNORMAL HIGH (ref 137–147)

## 2014-02-03 LAB — MAGNESIUM: Magnesium: 1.7 mg/dL (ref 1.5–2.5)

## 2014-02-03 LAB — BASIC METABOLIC PANEL
BUN: 8 mg/dL (ref 6–23)
CHLORIDE: 111 meq/L (ref 96–112)
CO2: 24 mEq/L (ref 19–32)
Calcium: 7.9 mg/dL — ABNORMAL LOW (ref 8.4–10.5)
Creatinine, Ser: 0.57 mg/dL (ref 0.50–1.10)
GFR calc Af Amer: >90 mL/min (ref >90)
GFR calc non Af Amer: 89 mL/min — ABNORMAL LOW (ref >90)
Glucose, Bld: 139 mg/dL — ABNORMAL HIGH (ref 70–99)
POTASSIUM: 3.4 meq/L — AB (ref 3.7–5.3)
Sodium: 148 mEq/L — ABNORMAL HIGH (ref 137–147)

## 2014-02-03 MED ORDER — BISOPROLOL-HYDROCHLOROTHIAZIDE 10-6.25 MG PO TABS: 1.0000 | ORAL_TABLET | Freq: Every day | ORAL | Status: DC

## 2014-02-03 MED ORDER — MAGNESIUM SULFATE 40 MG/ML IJ SOLN
2.0000 g | Freq: Once | INTRAMUSCULAR | Status: AC
  Administered 2014-02-03: 2 g via INTRAVENOUS
  Filled 2014-02-03: qty 50

## 2014-02-03 MED ORDER — CLONIDINE HCL 0.1 MG PO TABS
0.1000 mg | ORAL_TABLET | Freq: Once | ORAL | Status: AC
  Administered 2014-02-04: 0.1 mg via ORAL
  Filled 2014-02-03: qty 1

## 2014-02-03 MED ORDER — HYDRALAZINE HCL 20 MG/ML IJ SOLN: 10.0000 mg | Freq: Once | INTRAMUSCULAR | Status: DC

## 2014-02-03 MED ORDER — ATORVASTATIN CALCIUM 10 MG PO TABS
10.0000 mg | ORAL_TABLET | Freq: Every day | ORAL | Status: DC
  Administered 2014-02-03 – 2014-02-05 (×3): 10 mg via ORAL
  Filled 2014-02-03 (×4): qty 1

## 2014-02-03 MED ORDER — POTASSIUM CHLORIDE 10 MEQ/50ML IV SOLN
10.0000 meq | INTRAVENOUS | Status: AC
  Administered 2014-02-03 (×4): 10 meq via INTRAVENOUS
  Filled 2014-02-03 (×4): qty 50

## 2014-02-03 NOTE — Progress Notes (Signed)
Physical Therapy Treatment Patient Details Name: Tammy ClarksMargaret L Bowns MRN: [id number] DOB: May 01, 1941 Today's Date: 02/03/2014    History of Present Illness Past medical history of Anxiety; Depression; Hypertension; Hyperlipidemia, tobacco abuse who presented with a few days of "dizziness". 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     PT Comments    Patient making progress with mobility and gait today.  Continues to c/o dizziness - decreased today.  Reinforced focusing on object to minimize dizziness.  Agree with need for Inpatient Rehab stay for comprehensive therapies prior to discharge home.  Follow Up Recommendations  CIR     Equipment Recommendations  Rolling walker with 5" wheels    Recommendations for Other Services Rehab consult     Precautions / Restrictions Precautions Precautions: Fall (dizziness) Precaution Comments: Cerebellar stroke - ataxia Restrictions Weight Bearing Restrictions: No    Mobility  Bed Mobility Overal bed mobility: Needs Assistance Bed Mobility: Supine to Sit     Supine to sit: Min assist     General bed mobility comments: Verbal cues to slow down and wait until prep completed prior to sitting.  Patient able to move to sitting using bed rail.  Min assist for balance/safety.  Transfers Overall transfer level: Needs assistance Equipment used: Rolling walker (2 wheeled) Transfers: Sit to/from Stand Sit to Stand: Min assist;+2 physical assistance         General transfer comment: Verbal cues for hand placement.  Assist to rise to standing and for balance.  Once upright, balance fair.    Ambulation/Gait Ambulation/Gait assistance: Min assist;+2 safety/equipment (+1 chair) Ambulation Distance (Feet): 6 Feet Assistive device: Rolling walker (2 wheeled) Gait Pattern/deviations: Step-through pattern;Decreased step length - right;Decreased step length - left;Decreased stride length;Shuffle;Trunk flexed;Ataxic Gait  velocity: Slow Gait velocity interpretation: Below normal speed for age/gender General Gait Details: Verbal cues and physical assist for safe use of RW.  Cues to stand upright and stay close to RW.  Patient taking short shuffling steps.  Assist for balance/safety.   Stairs            Wheelchair Mobility    Modified Rankin (Stroke Patients Only) Modified Rankin (Stroke Patients Only) Pre-Morbid Rankin Score: No symptoms Modified Rankin: Moderately severe disability     Balance Overall balance assessment: Needs assistance Sitting-balance support: No upper extremity supported;Feet supported Sitting balance-Leahy Scale: Fair Sitting balance - Comments: More steady in sitting today   Standing balance support: Bilateral upper extremity supported;During functional activity Standing balance-Leahy Scale: Poor Standing balance comment: More steady in stance today.  Ataxic movement noted with gait                    Cognition Arousal/Alertness: Awake/alert Behavior During Therapy: Impulsive Overall Cognitive Status: Impaired/Different from baseline Area of Impairment: Orientation;Safety/judgement;Problem solving Orientation Level: Disoriented to;Time       Safety/Judgement: Decreased awareness of safety   Problem Solving: Slow processing;Requires verbal cues;Requires tactile cues      Exercises Other Exercises Other Exercises: visual fixation during movement;limited head movement    General Comments        Pertinent Vitals/Pain     Home Living                      Prior Function            PT Goals (current goals can now be found in the care plan section) Progress towards PT goals: Progressing toward goals    Frequency  Min 4X/week    PT Plan Current plan remains appropriate    Co-evaluation             End of Session Equipment Utilized During Treatment: Gait belt Activity Tolerance: Patient limited by fatigue Patient left: in  chair;with call bell/phone within reach;with family/visitor present     Time: 1040-1103 PT Time Calculation (min): 23 min  Charges:  $Therapeutic Activity: 23-37 mins                    G Codes:      Vena Austria 02/21/2014, 12:28 PM Durenda Hurt. Renaldo Fiddler, [facility] Acute Rehab Services Pager [phone]

## 2014-02-03 NOTE — Progress Notes (Signed)
Stroke Team Progress Note  HISTORY Tammy Barron is a 73 y.o. female with a history of hypertension, hyperlipidemia, and tobacco abuse who presented 01/31/2014 with 2 days of dizziness. She stated that she tried to get out of bed Tuesday morning and she had difficulty walking. She had persistent vertigo and nausea. On the day of admission it was not any better and therefore she sought further treatment in the emergency room.   LKW: Monday 3/30 prior to bedtime.  tpa given?: no, outside of window   SUBJECTIVE Pt was in A-fib with RVR overnight. Started on Cardizem gtt.   OBJECTIVE Most recent Vital Signs: Filed Vitals:   02/03/14 0500 02/03/14 0600 02/03/14 0700 02/03/14 0800  BP: 128/61     Pulse: 74 76 67   Temp:    99.4 F (37.4 C)  TempSrc:    Oral  Resp: 21 22 20    Height:      Weight: 89.3 kg (196 lb 13.9 oz)     SpO2: 96% 95% 96%    CBG (last 3)  No results found for this basename: GLUCAP,  in the last 72 hours  IV Fluid Intake:   . diltiazem (CARDIZEM) infusion 5 mg/hr (02/03/14 0700)  . sodium chloride (hypertonic) 75 mL/hr at 02/03/14 0700    MEDICATIONS  .  stroke: mapping our early stages of recovery book   Does not apply Once  . aspirin  300 mg Rectal Daily   Or  . aspirin  325 mg Oral Daily  . cycloSPORINE  1 drop Both Eyes BID  . guaiFENesin  600 mg Oral BID  . heparin  5,000 Units Subcutaneous 3 times per day  . multivitamin with minerals  1 tablet Oral Daily  . simvastatin  20 mg Oral q1800   PRN:  albuterol, clorazepate, ondansetron (ZOFRAN) IV, senna-docusate  Diet:  Cardiac thin liquids Activity:  Up with assistance DVT Prophylaxis:  Subcutaneous heparin  CLINICALLY SIGNIFICANT STUDIES Basic Metabolic Panel:   Recent Labs Lab 02/02/14 1720 02/03/14 0500  NA 146 147  148*  K 2.9* 3.4*  CL 107 111  CO2 26 24  GLUCOSE 101* 139*  BUN 7 8  CREATININE 0.50 0.57  CALCIUM 7.9* 7.9*  MG 1.7 1.7   Liver Function Tests:   Recent  Labs Lab 01/31/14 1112  AST 20  ALT 12  ALKPHOS 52  BILITOT 0.5  PROT 7.6  ALBUMIN 3.6   CBC:   Recent Labs Lab 01/31/14 1112 01/31/14 2255  WBC 12.1* 12.2*  NEUTROABS 10.7*  --   HGB 15.4* 13.5  HCT 43.4 38.4  MCV 87.3 88.7  PLT 198 181   Coagulation:   Recent Labs Lab 01/31/14 1233  LABPROT 13.3  INR 1.03   Cardiac Enzymes: No results found for this basename: CKTOTAL, CKMB, CKMBINDEX, TROPONINI,  in the last 168 hours Urinalysis:   Recent Labs Lab 01/31/14 1444 02/01/14 1405  COLORURINE YELLOW YELLOW  LABSPEC 1.022 1.021  PHURINE 5.5 6.0  GLUCOSEU NEGATIVE NEGATIVE  HGBUR NEGATIVE NEGATIVE  BILIRUBINUR NEGATIVE NEGATIVE  KETONESUR 40* NEGATIVE  PROTEINUR 30* NEGATIVE  UROBILINOGEN 0.2 0.2  NITRITE NEGATIVE NEGATIVE  LEUKOCYTESUR NEGATIVE NEGATIVE   Lipid Panel    Component Value Date/Time   CHOL 209* 02/01/2014 0450   TRIG 189* 02/01/2014 0450   HDL 45 02/01/2014 0450   CHOLHDL 4.6 02/01/2014 0450   VLDL 38 02/01/2014 0450   LDLCALC 126* 02/01/2014 0450   HgbA1C  Lab Results  Component  Value Date   HGBA1C 5.3 02/01/2014    Urine Drug Screen:   No results found for this basename: labopia,  cocainscrnur,  labbenz,  amphetmu,  thcu,  labbarb    Alcohol Level: No results found for this basename: ETH,  in the last 168 hours  Ct Head Wo Contrast 02/01/2014    Evolving right posterior inferior cerebellar artery territory infarct with worsening posterior fossa mass effect, increasing hydrocephalus. No hemorrhagic conversion. Effaced pre pontine cistern with mass effect on the basilar artery.     Ct Head Wo Contrast 01/31/2014    Extensive right cerebellar edema causing mass effect and effacement on the fourth ventricle and relative hydrocephalus of the lateral and third ventricles. The appearance in the right cerebellum is concerning for potential mass, although in acute infarct could present similarly. There may be some hemorrhage along the periphery of the  right cerebellum in this lesion. No other hemorrhage seen. MR correlation with and without intravenous contrast material advised.  Elsewhere, there is mild small vessel disease in the centra semiovale bilaterally. The lateral and third ventricle are midline in location.   4/4  Continued interval evolution of right posterior inferior cerebral  artery territory infarct with similar mass effect on the adjacent  fourth ventricle with similar hydrocephalus. No hemorrhagic  conversion. Effacement of the pre pontine cistern with mass effect  on the basilar artery is similar.  Mri Head Wo Contrast 01/31/2014    Acute right PICA infarct in the cerebellum without hemorrhage. There is edema and mass-effect on the fourth ventricle without hydrocephalus.  There is generalized atrophy.  Negative for hemorrhage or mass.      Mra Brain W Wo Contrast 01/31/2014    Small and irregular distal right vertebral artery with occlusion of the right PICA consistent with acute infarct.       Dg Chest Port 1 View 01/31/2014    1. The tip of the left IJ approach central venous catheter overlies the left innominate vein. 2. Aortic atherosclerosis. 3. No acute cardiopulmonary process.      2D Echocardiogram  - ejection fraction 55-60%. Grade 1 diastolic dysfunction. No cardiac source of emboli identified.  Carotid Doppler  - Preliminary report: Right: 60-79% ICA stenosis. Left: Unable to visualize due to central line/dressings. Right vertebral retrograde. Left vertebral antegrade.    EKG sinus rhythm rate 83 beats per minute. For complete results please see formal report.   Therapy Recommendations pending  Physical Exam   Mental Status:  Patient is awake and alert. Immediate and remote memory are intact.  Patient is able to give a clear and coherent history.  No signs of aphasia or neglect  Cranial Nerves:  II: Visual Fields are full. Pupils are equal, round, and reactive to light. Discs are difficult to  visualize.  III,IV, VI: EOMI without ptosis or diploplia.  V: Facial sensation is symmetric to temperature  VII: Facial movement is symmetric.  VIII: hearing is intact to voice  X: Uvula elevates symmetrically  XI: Shoulder shrug is symmetric.  XII: tongue is midline without atrophy or fasciculations.  Motor:  Tone is normal. Bulk is normal. 5/5 strength was present in all four extremities.  Sensory:  Sensation is symmetric to light touch and temperature in the arms and legs.  Deep Tendon Reflexes:  2+ and symmetric in the biceps and patellae.  Plantars:  Toes are downgoing bilaterally.  Cerebellar:  FNF and HKS are intact on left, dysmetria present on right.  Gait:  Not tested.   ASSESSMENT Tammy Barron is a 73 y.o. female presenting with dizziness. TPA was not given secondary to delay in arrival.  An MRI revealed  an acute right PICA infarct in the cerebellum without hemorrhage. Infarct felt to be embolic or thrombotic of unknown etiology.  On no antithrombotic prior to admission. Now on aspirin 325 mg orally every day for secondary stroke prevention. Patient with resultant continued dizziness. Stroke work up underway.   Hypokalemia  Hyponatremia  Hypertension history  Right ICA stenosis 60-79% ICA by Doppler.  Cough - rales at bases.  Hyperlipidemia - cholesterol 209; LDL 126  Hemoglobin A1c 5.3  Tobacco history   Hospital day # 3  TREATMENT/PLAN  Continue aspirin 325 mg orally every day for secondary stroke prevention.  A-fib with RVR this AM started on Caridzem gtt   Because of A-fib, pt might have had hx of paroxysmal A-fib. Would hold off TEE at this point as A-fib could be the source.   Statin  Continue hypertonic saline sodium 147. 3% at 75cc/hr  repeact CTH no major change from prior  Risk factor modification - tobacco cessation.   Pauletta Browns   02/03/2014, 8:37 AM   To contact Stroke Continuity provider, please refer to  [email or web address]. After hours, contact General Neurology

## 2014-02-03 NOTE — Progress Notes (Signed)
Progress Note [place] TEAM 1 - Stepdown/ICU TEAM  Tammy Barron WUJ:[id number] DOB: 08/15/1941 DOA: 01/31/2014 PCP: Rogelia Boga, MD  Brief narrative: 73 y.o. female who presented on 01/31/2014 with a history of Anxiety; Depression; Hypertension; Hyperlipidemia, and tobacco abuse w/ c/o a few days of "dizziness" which was c/w vertigo. She was found to have a right PICA infarct with mass effect and hydrocephalus. Right PICA noted to be occluded - suspected by Neurology to be an embolic event.  The pt was transferred to the ICU after admission due to increasing lethargy.  Subjective: Cotinues to c/o double vision.  Some slight HA.  Denies vomiting.  Some modest nausea.  Vertigo seems to be improving.    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 - 3% saline + full dose ASA per Neuro  - Neuro had plans for TEE Monday, with implantation of loop to r/o afib/arrythmia, but pt went briefly into afib last night   Newly appreciated Afib  Control lytes - check TSH - has converted back to NSR this morning - systemic anticoag timing to be directed by Neurology   Hypokalemia - replace with goal of 4.0 or > - Mg is ok, but will replace to goal of 2.0 or >  HYPERLIPIDEMIA - statin started this admission  ANXIETY - PRN Tranxene - appears reasonably managed at this time  Tobacco abuse - prn nebs + IS + flutter valve - counseled on need to d/c tobacco   HYPERTENSION - Stroke Team allowing permissive HTN - Ziac on hold - will defer to Neuro   Code Status: NO CPR/cardioversion per Dr Alene Mires discussion with pt - see his note  Family Communication: spoke w/ son and daughter at bedside  Disposition Plan: Follow in ICU due to hypertonic saline   Consultants: Stroke Team   Procedures: TTE - 4/2 - EF 55% to 60% - wall motion was normal - grade 1 diastolic dysfunction - no evidence of embolic source   Carotid dopplers - pending    Antibiotics: None  DVT prophylaxis: SQ Heparin  Objective: Filed Weights   01/31/14 1900 02/03/14 0500  Weight: 86.3 kg (190 lb 4.1 oz) 89.3 kg (196 lb 13.9 oz)   Blood pressure 160/76, pulse 76, temperature 98.2 F (36.8 C), temperature source Oral, resp. rate 20, height 5' 4.5" (1.638 m), weight 89.3 kg (196 lb 13.9 oz), SpO2 95.00%.  Intake/Output Summary (Last 24 hours) at 02/03/14 1753 Last data filed at 02/03/14 1500  Gross per 24 hour  Intake 2990.33 ml  Output   1770 ml  Net 1220.33 ml   Exam: General: no acute respiratory distress - alert and conversant  Lungs: course crackles th/o all fields - no active wheeze  Cardiovascular: regular rate and rhythm at present without murmur gallop or rub normal S1 and S2 Abdomen: nontender, nondistended, soft, bowel sounds positive, no rebound, no ascites, no appreciable mass Extremities: no significant cyanosis, clubbing, or edema bilateral lower extremities  Data Reviewed: Basic Metabolic Panel:  Recent Labs Lab 01/31/14 1112 01/31/14 2255  02/02/14 0910 02/02/14 1520 02/02/14 1720 02/03/14 0500 02/03/14 1525  NA 136* 139  < > 146 143 146 147  148* 149*  K 3.4*  --   --   --   --  2.9* 3.4*  --   CL 94*  --   --   --   --  107 111  --   CO2 24  --   --   --   --  26 24  --   GLUCOSE 171*  --   --   --   --  101* 139*  --   BUN 29*  --   --   --   --  7 8  --   CREATININE 0.69 0.66  --   --   --  0.50 0.57  --   CALCIUM 9.8  --   --   --   --  7.9* 7.9*  --   MG  --   --   --   --   --  1.7 1.7  --   < > = values in this interval not displayed.  Liver Function Tests:  Recent Labs Lab 01/31/14 1112  AST 20  ALT 12  ALKPHOS 52  BILITOT 0.5  PROT 7.6  ALBUMIN 3.6    Recent Labs Lab 01/31/14 1112  LIPASE 16   CBC:  Recent Labs Lab 01/31/14 1112 01/31/14 2255  WBC 12.1* 12.2*  NEUTROABS 10.7*  --   HGB 15.4* 13.5  HCT 43.4 38.4  MCV 87.3 88.7  PLT 198 181    Recent Results (from the past  240 hour(s))  MRSA PCR SCREENING     Status: None   Collection Time    01/31/14  7:00 PM      Result Value Ref Range Status   MRSA by PCR NEGATIVE  NEGATIVE Final   Comment:            The GeneXpert MRSA Assay (FDA     approved for NASAL specimens     only), is one component of a     comprehensive MRSA colonization     surveillance program. It is not     intended to diagnose MRSA     infection nor to guide or     monitor treatment for     MRSA infections.     Studies:  Recent x-ray studies have been reviewed in detail by the Attending Physician  Scheduled Meds:  Scheduled Meds: .  stroke: mapping our early stages of recovery book   Does not apply Once  . aspirin  300 mg Rectal Daily   Or  . aspirin  325 mg Oral Daily  . atorvastatin  10 mg Oral q1800  . cycloSPORINE  1 drop Both Eyes BID  . guaiFENesin  600 mg Oral BID  . heparin  5,000 Units Subcutaneous 3 times per day  . magnesium sulfate 1 - 4 g bolus IVPB  2 g Intravenous Once  . multivitamin with minerals  1 tablet Oral Daily  . potassium chloride  10 mEq Intravenous Q1 Hr x 4   Continuous Infusions: . diltiazem (CARDIZEM) infusion 5 mg/hr (02/03/14 1500)  . sodium chloride (hypertonic) 75 mL/hr (02/03/14 1100)    Time spent on care of this patient: 35 mins   MCCLUNG,JEFFREY T, MD  [facility] Office  [phone] Pager - Text Page per Loretha StaplerAmion as per below:  On-Call/Text Page:      [email or web address]  If 7PM-7AM, please contact night-coverage [email or web address] 02/03/2014, 5:53 PM   LOS: 3 days

## 2014-02-04 LAB — TSH: TSH: 1.83 u[IU]/mL (ref 0.350–4.500)

## 2014-02-04 LAB — BASIC METABOLIC PANEL
BUN: 9 mg/dL (ref 6–23)
CHLORIDE: 114 meq/L — AB (ref 96–112)
CO2: 25 mEq/L (ref 19–32)
Calcium: 7.8 mg/dL — ABNORMAL LOW (ref 8.4–10.5)
Creatinine, Ser: 0.55 mg/dL (ref 0.50–1.10)
GFR calc non Af Amer: >90 mL/min (ref >90)
Glucose, Bld: 109 mg/dL — ABNORMAL HIGH (ref 70–99)
Potassium: 3.6 mEq/L — ABNORMAL LOW (ref 3.7–5.3)
Sodium: 149 mEq/L — ABNORMAL HIGH (ref 137–147)

## 2014-02-04 LAB — SODIUM
SODIUM: 146 meq/L (ref 137–147)
Sodium: 148 mEq/L — ABNORMAL HIGH (ref 137–147)
Sodium: 146 mEq/L (ref 137–147)

## 2014-02-04 LAB — MAGNESIUM: MAGNESIUM: 2 mg/dL (ref 1.5–2.5)

## 2014-02-04 MED ORDER — ACETAMINOPHEN 325 MG PO TABS: 650.0000 mg | ORAL_TABLET | Freq: Four times a day (QID) | ORAL | Status: DC | PRN

## 2014-02-04 MED ORDER — LABETALOL HCL 100 MG PO TABS
100.0000 mg | ORAL_TABLET | Freq: Two times a day (BID) | ORAL | Status: DC
  Administered 2014-02-04 – 2014-02-05 (×3): 100 mg via ORAL
  Filled 2014-02-04 (×4): qty 1

## 2014-02-04 MED ORDER — POTASSIUM CHLORIDE 10 MEQ/50ML IV SOLN
10.0000 meq | INTRAVENOUS | Status: AC
  Administered 2014-02-04 (×4): 10 meq via INTRAVENOUS
  Filled 2014-02-04 (×4): qty 50

## 2014-02-04 MED ORDER — DILTIAZEM HCL 30 MG PO TABS
30.0000 mg | ORAL_TABLET | Freq: Four times a day (QID) | ORAL | Status: DC
  Administered 2014-02-04 – 2014-02-06 (×8): 30 mg via ORAL
  Filled 2014-02-04 (×12): qty 1

## 2014-02-04 NOTE — Progress Notes (Signed)
Progress Note [place] TEAM 1 - Stepdown/ICU TEAM  Cleda ClarksMargaret L Pavel [id number]:[id number] DOB: October 20, 1941 DOA: 01/31/2014 PCP: Rogelia BogaKWIATKOWSKI,PETER FRANK, MD  Brief narrative: 73 y.o. female who presented on 01/31/2014 with a history of Anxiety; Depression; Hypertension; Hyperlipidemia, and tobacco abuse w/ c/o a few days of "dizziness" which was c/w vertigo. She was found to have a right PICA infarct with mass effect and hydrocephalus. Right PICA noted to be occluded - suspected by Neurology to be an embolic event.  The pt was transferred to the ICU after admission due to increasing lethargy.  Subjective: Pt is feeling much better today.  She states that her double vision has resolved.  Denies HA, sob, cp, or palpitation.s    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 - 3% saline + full dose ASA per Neuro  - Neuro had plans for TEE Monday, with implantation of loop to r/o afib/arrythmia, but pt went briefly into afib last night   Newly appreciated Afib  Control lytes - TSH normal - remains in NSR at present - systemic anticoag timing to be directed by Neurology   Hypokalemia - cont to replace with goal of 4.0 or > - Mg at goal   HYPERLIPIDEMIA - statin started this admission  ANXIETY - PRN Tranxene - well managed at this time  Tobacco abuse - prn nebs + IS + flutter valve - counseled on need to d/c tobacco   HYPERTENSION - pt now at point in hospital stay at which modest improvement in BP control would be appropriate - use care not to overshoot - carefully titrate meds and follow   Code Status: NO CPR/cardioversion per Dr Alene MiresKirkpatrick's discussion with pt - see his note  Family Communication: spoke w/ son and daughter at bedside  Disposition Plan: Follow in ICU due to hypertonic saline - ultimate plan is for d/c to CIR   Consultants: Stroke Team   Procedures: TTE - 4/2 - EF 55% to 60% - wall motion was normal - grade 1 diastolic dysfunction - no  evidence of embolic source   Carotid dopplers - pending   Antibiotics: None  DVT prophylaxis: SQ Heparin  Objective: Filed Weights   01/31/14 1900 02/03/14 0500  Weight: 86.3 kg (190 lb 4.1 oz) 89.3 kg (196 lb 13.9 oz)   Blood pressure 164/78, pulse 65, temperature 97.5 F (36.4 C), temperature source Axillary, resp. rate 19, height 5' 4.5" (1.638 m), weight 89.3 kg (196 lb 13.9 oz), SpO2 89.00%.  Intake/Output Summary (Last 24 hours) at 02/04/14 1032 Last data filed at 02/04/14 0700  Gross per 24 hour  Intake   2140 ml  Output   1455 ml  Net    685 ml   Exam: General: no acute respiratory distress - alert and conversant  Lungs: mild crackles th/o all fiels - no active wheeze Cardiovascular: regular rate and rhythm at present without murmur gallop or rub Abdomen: nontender, nondistended, soft, bowel sounds positive, no rebound, no ascites, no appreciable mass Extremities: no significant cyanosis, clubbing, or edema bilateral lower extremities  Data Reviewed: Basic Metabolic Panel:  Recent Labs Lab 01/31/14 1112 01/31/14 2255  02/02/14 1520 02/02/14 1720 02/03/14 0500 02/03/14 1525 02/03/14 2124 02/04/14 0235 02/04/14 0914  NA 136* 139  < > 143 146 147  148* 149* 148* 149* 146  K 3.4*  --   --   --  2.9* 3.4*  --   --  3.6*  --   CL 94*  --   --   --  107 111  --   --  114*  --   CO2 24  --   --   --  26 24  --   --  25  --   GLUCOSE 171*  --   --   --  101* 139*  --   --  109*  --   BUN 29*  --   --   --  7 8  --   --  9  --   CREATININE 0.69 0.66  --   --  0.50 0.57  --   --  0.55  --   CALCIUM 9.8  --   --   --  7.9* 7.9*  --   --  7.8*  --   MG  --   --   --   --  1.7 1.7  --   --  2.0  --   < > = values in this interval not displayed.  Liver Function Tests:  Recent Labs Lab 01/31/14 1112  AST 20  ALT 12  ALKPHOS 52  BILITOT 0.5  PROT 7.6  ALBUMIN 3.6    Recent Labs Lab 01/31/14 1112  LIPASE 16   CBC:  Recent Labs Lab 01/31/14 1112  01/31/14 2255  WBC 12.1* 12.2*  NEUTROABS 10.7*  --   HGB 15.4* 13.5  HCT 43.4 38.4  MCV 87.3 88.7  PLT 198 181    Recent Results (from the past 240 hour(s))  MRSA PCR SCREENING     Status: None   Collection Time    01/31/14  7:00 PM      Result Value Ref Range Status   MRSA by PCR NEGATIVE  NEGATIVE Final   Comment:            The GeneXpert MRSA Assay (FDA     approved for NASAL specimens     only), is one component of a     comprehensive MRSA colonization     surveillance program. It is not     intended to diagnose MRSA     infection nor to guide or     monitor treatment for     MRSA infections.     Studies:  Recent x-ray studies have been reviewed in detail by the Attending Physician  Scheduled Meds:  Scheduled Meds: .  stroke: mapping our early stages of recovery book   Does not apply Once  . aspirin  325 mg Oral Daily  . atorvastatin  10 mg Oral q1800  . cycloSPORINE  1 drop Both Eyes BID  . diltiazem  30 mg Oral 4 times per day  . guaiFENesin  600 mg Oral BID  . heparin  5,000 Units Subcutaneous 3 times per day  . labetalol  100 mg Oral BID  . multivitamin with minerals  1 tablet Oral Daily   Continuous Infusions: . sodium chloride (hypertonic) 75 mL/hr at 02/04/14 0908    Time spent on care of this patient: 35 mins   Viggo Perko T, MD  [facility] Office  [phone] Pager - Text Page per Loretha Stapler as per below:  On-Call/Text Page:      [email or web address]  If 7PM-7AM, please contact night-coverage [email or web address] 02/04/2014, 10:32 AM   LOS: 4 days

## 2014-02-04 NOTE — Progress Notes (Signed)
Stroke Team Progress Note  HISTORY Tammy Barron is a 73 y.o. female with a history of hypertension, hyperlipidemia, and tobacco abuse who presented 01/31/2014 with 2 days of dizziness. She stated that she tried to get out of bed Tuesday morning and she had difficulty walking. She had persistent vertigo and nausea. On the day of admission it was not any better and therefore she sought further treatment in the emergency room.   LKW: Monday 3/30 prior to bedtime.  tpa given?: no, outside of window   SUBJECTIVE The patient's family is present today including her son who is a Development worker, communityphysician. Dr.Joseguadalupe Stan updated them with regards to the patient's most recent events including onset of atrial fibrillation.   OBJECTIVE Most recent Vital Signs: Filed Vitals:   02/04/14 0630 02/04/14 0645 02/04/14 0700 02/04/14 0817  BP:   192/58   Pulse: 77 65 65   Temp:    97.5 F (36.4 C)  TempSrc:    Axillary  Resp: 22 20 19    Height:      Weight:      SpO2: 91% 90% 89%    CBG (last 3)  No results found for this basename: GLUCAP,  in the last 72 hours  IV Fluid Intake:   . diltiazem (CARDIZEM) infusion 5 mg/hr (02/03/14 1900)  . sodium chloride (hypertonic) 75 mL/hr (02/04/14 0053)    MEDICATIONS  .  stroke: mapping our early stages of recovery book   Does not apply Once  . aspirin  325 mg Oral Daily  . atorvastatin  10 mg Oral q1800  . cycloSPORINE  1 drop Both Eyes BID  . guaiFENesin  600 mg Oral BID  . heparin  5,000 Units Subcutaneous 3 times per day  . labetalol  100 mg Oral BID  . multivitamin with minerals  1 tablet Oral Daily   PRN:  clorazepate, ondansetron (ZOFRAN) IV, senna-docusate  Diet:  Cardiac thin liquids Activity:  Up with assistance DVT Prophylaxis:  Subcutaneous heparin  CLINICALLY SIGNIFICANT STUDIES Basic Metabolic Panel:   Recent Labs Lab 02/03/14 0500  02/03/14 2124 02/04/14 0235  NA 147  148*  < > 148* 149*  K 3.4*  --   --  3.6*  CL 111  --   --  114*   CO2 24  --   --  25  GLUCOSE 139*  --   --  109*  BUN 8  --   --  9  CREATININE 0.57  --   --  0.55  CALCIUM 7.9*  --   --  7.8*  MG 1.7  --   --  2.0  < > = values in this interval not displayed. Liver Function Tests:   Recent Labs Lab 01/31/14 1112  AST 20  ALT 12  ALKPHOS 52  BILITOT 0.5  PROT 7.6  ALBUMIN 3.6   CBC:   Recent Labs Lab 01/31/14 1112 01/31/14 2255  WBC 12.1* 12.2*  NEUTROABS 10.7*  --   HGB 15.4* 13.5  HCT 43.4 38.4  MCV 87.3 88.7  PLT 198 181   Coagulation:   Recent Labs Lab 01/31/14 1233  LABPROT 13.3  INR 1.03   Cardiac Enzymes: No results found for this basename: CKTOTAL, CKMB, CKMBINDEX, TROPONINI,  in the last 168 hours Urinalysis:   Recent Labs Lab 01/31/14 1444 02/01/14 1405  COLORURINE YELLOW YELLOW  LABSPEC 1.022 1.021  PHURINE 5.5 6.0  GLUCOSEU NEGATIVE NEGATIVE  HGBUR NEGATIVE NEGATIVE  BILIRUBINUR NEGATIVE NEGATIVE  KETONESUR 40*  NEGATIVE  PROTEINUR 30* NEGATIVE  UROBILINOGEN 0.2 0.2  NITRITE NEGATIVE NEGATIVE  LEUKOCYTESUR NEGATIVE NEGATIVE   Lipid Panel    Component Value Date/Time   CHOL 209* 02/01/2014 0450   TRIG 189* 02/01/2014 0450   HDL 45 02/01/2014 0450   CHOLHDL 4.6 02/01/2014 0450   VLDL 38 02/01/2014 0450   LDLCALC 126* 02/01/2014 0450   HgbA1C  Lab Results  Component Value Date   HGBA1C 5.3 02/01/2014    Urine Drug Screen:   No results found for this basename: labopia,  cocainscrnur,  labbenz,  amphetmu,  thcu,  labbarb    Alcohol Level: No results found for this basename: ETH,  in the last 168 hours  Ct Head Wo Contrast 02/01/2014    Evolving right posterior inferior cerebellar artery territory infarct with worsening posterior fossa mass effect, increasing hydrocephalus. No hemorrhagic conversion. Effaced pre pontine cistern with mass effect on the basilar artery.     Ct Head Wo Contrast 01/31/2014    Extensive right cerebellar edema causing mass effect and effacement on the fourth ventricle and  relative hydrocephalus of the lateral and third ventricles. The appearance in the right cerebellum is concerning for potential mass, although in acute infarct could present similarly. There may be some hemorrhage along the periphery of the right cerebellum in this lesion. No other hemorrhage seen. MR correlation with and without intravenous contrast material advised.  Elsewhere, there is mild small vessel disease in the centra semiovale bilaterally. The lateral and third ventricle are midline in location.   4/4  Continued interval evolution of right posterior inferior cerebral  artery territory infarct with similar mass effect on the adjacent  fourth ventricle with similar hydrocephalus. No hemorrhagic  conversion. Effacement of the pre pontine cistern with mass effect  on the basilar artery is similar.  Mri Head Wo Contrast 01/31/2014    Acute right PICA infarct in the cerebellum without hemorrhage. There is edema and mass-effect on the fourth ventricle without hydrocephalus.  There is generalized atrophy.  Negative for hemorrhage or mass.      Mra Brain W Wo Contrast 01/31/2014    Small and irregular distal right vertebral artery with occlusion of the right PICA consistent with acute infarct.       Dg Chest Port 1 View 01/31/2014    1. The tip of the left IJ approach central venous catheter overlies the left innominate vein. 2. Aortic atherosclerosis. 3. No acute cardiopulmonary process.      2D Echocardiogram  - ejection fraction 55-60%. Grade 1 diastolic dysfunction. No cardiac source of emboli identified.  Carotid Doppler  - Preliminary report: Right: 60-79% ICA stenosis. Left: Unable to visualize due to central line/dressings. Right vertebral retrograde. Left vertebral antegrade.    EKG sinus rhythm rate 83 beats per minute. For complete results please see formal report.   Therapy Recommendations - inpatient rehabilitation recommended  Physical Exam   Mental Status:  Patient is  awake and alert. She follows all commands. No signs of aphasia or neglect  Cranial Nerves:  II: Visual Fields are full. Pupils are equal, round, and reactive to light. Discs are difficult to visualize.  III,IV, VI: EOMI without ptosis or diploplia.  V: Facial sensation is symmetric to temperature  VII: Facial movement is symmetric.  VIII: hearing is intact to voice  X: Uvula elevates symmetrically  XI: Shoulder shrug is symmetric.  XII: tongue is midline without atrophy or fasciculations.  Motor:  Tone is normal. Bulk is normal. 5/5  strength was present in all four extremities.  Sensory:  Sensation is symmetric to light touch and temperature in the arms and legs.  Deep Tendon Reflexes:  2+ and symmetric in the biceps and patellae.  Plantars:  Toes are downgoing bilaterally.  Cerebellar:  FNF and HKS are intact on left, dysmetria present on right.  Gait:  Not tested.   ASSESSMENT Tammy Barron is a 73 y.o. female presenting with dizziness. TPA was not given secondary to delay in arrival.  An MRI revealed  an acute right PICA infarct in the cerebellum without hemorrhage. Infarct felt to be embolic or thrombotic of unknown etiology.  On no antithrombotic prior to admission. Now on aspirin 325 mg orally every day for secondary stroke prevention. Patient with resultant continued dizziness. Stroke work up underway.   Hypokalemia  Hyponatremia  Hypertension history  Right ICA stenosis 60-79% ICA by Doppler.  Cough - rales at bases.  Hyperlipidemia - cholesterol 209; LDL 126  Hemoglobin A1c 5.3  Tobacco history   Hospital day # 4  TREATMENT/PLAN  Continue aspirin 325 mg orally every day for secondary stroke prevention.  A-fib with RVR this AM started on Caridzem gtt - to be changed to by mouth Cardizem and titrated as needed   Because of documented A-fib the TEE and loop recorder will be canceled.  Now on Lipitor 10 mg daily.   Continue hypertonic saline  sodium 149 today. 3% at 75cc/hr  repeact CTH no major change from prior  Risk factor modification - tobacco cessation.  Therapists recommended inpatient rehabilitation. MD consult pending.  Consider vascular surgery consult for right internal carotid artery stenosis.  Anticoagulate for atrial fibrillation when appropriate.  Transfer to step down today.  Delton See PA-C [facility] Pager [phone] 02/04/2014, 8:46 AM  Agree with above. Significant time spent discussing pt care with family at bedside. Transfer to step down today  To contact Stroke Continuity provider, please refer to [email or web address]. After hours, contact General Neurology

## 2014-02-05 ENCOUNTER — Encounter (HOSPITAL_COMMUNITY)
Admission: EM | Disposition: A | Discharge status: Rehabilitation Facility | Payer: Self-pay | Source: Home / Self Care | Attending: Neurology

## 2014-02-05 DIAGNOSIS — F17200 Nicotine dependence, unspecified, uncomplicated: Secondary | ICD-10-CM | POA: Diagnosis present

## 2014-02-05 DIAGNOSIS — E870 Hyperosmolality and hypernatremia: Secondary | ICD-10-CM | POA: Diagnosis not present

## 2014-02-05 DIAGNOSIS — I4891 Unspecified atrial fibrillation: Secondary | ICD-10-CM

## 2014-02-05 DIAGNOSIS — E669 Obesity, unspecified: Secondary | ICD-10-CM | POA: Diagnosis present

## 2014-02-05 DIAGNOSIS — I480 Paroxysmal atrial fibrillation: Secondary | ICD-10-CM | POA: Diagnosis not present

## 2014-02-05 DIAGNOSIS — I6340 Cerebral infarction due to embolism of unspecified cerebral artery: Secondary | ICD-10-CM

## 2014-02-05 LAB — CBC
HCT: 32.7 % — ABNORMAL LOW (ref 36.0–46.0)
Hemoglobin: 10.7 g/dL — ABNORMAL LOW (ref 12.0–15.0)
MCH: 30.7 pg (ref 26.0–34.0)
MCHC: 32.7 g/dL (ref 30.0–36.0)
MCV: 93.7 fL (ref 78.0–100.0)
Platelets: 130 10*3/uL — ABNORMAL LOW (ref 150–400)
RBC: 3.49 MIL/uL — AB (ref 3.87–5.11)
RDW: 13.5 % (ref 11.5–15.5)
WBC: 6.9 10*3/uL (ref 4.0–10.5)

## 2014-02-05 LAB — BASIC METABOLIC PANEL
BUN: 10 mg/dL (ref 6–23)
CO2: 27 mEq/L (ref 19–32)
Calcium: 8.1 mg/dL — ABNORMAL LOW (ref 8.4–10.5)
Chloride: 112 mEq/L (ref 96–112)
Creatinine, Ser: 0.57 mg/dL (ref 0.50–1.10)
GFR calc Af Amer: >90 mL/min (ref >90)
GFR, EST NON AFRICAN AMERICAN: 89 mL/min — AB (ref >90)
Glucose, Bld: 104 mg/dL — ABNORMAL HIGH (ref 70–99)
Potassium: 3.9 mEq/L (ref 3.7–5.3)
SODIUM: 148 meq/L — AB (ref 137–147)

## 2014-02-05 LAB — SODIUM
SODIUM: 147 meq/L (ref 137–147)
SODIUM: 144 meq/L (ref 137–147)
Sodium: 148 mEq/L — ABNORMAL HIGH (ref 137–147)

## 2014-02-05 SURGERY — ECHOCARDIOGRAM, TRANSESOPHAGEAL
Anesthesia: Moderate Sedation

## 2014-02-05 MED ORDER — FUROSEMIDE 10 MG/ML IJ SOLN
10.0000 mg | Freq: Once | INTRAMUSCULAR | Status: AC
  Administered 2014-02-05: 10 mg via INTRAVENOUS
  Filled 2014-02-05: qty 1

## 2014-02-05 MED ORDER — LABETALOL HCL 5 MG/ML IV SOLN
INTRAVENOUS | Status: AC
  Filled 2014-02-05: qty 4

## 2014-02-05 MED ORDER — LABETALOL HCL 5 MG/ML IV SOLN
10.0000 mg | Freq: Once | INTRAVENOUS | Status: AC
  Administered 2014-02-05: 10 mg via INTRAVENOUS

## 2014-02-05 MED ORDER — BISOPROLOL-HYDROCHLOROTHIAZIDE 10-6.25 MG PO TABS
1.0000 | ORAL_TABLET | Freq: Every day | ORAL | Status: DC
  Administered 2014-02-05 – 2014-02-06 (×2): 1 via ORAL
  Filled 2014-02-05 (×2): qty 1

## 2014-02-05 MED ORDER — LABETALOL HCL 5 MG/ML IV SOLN
10.0000 mg | INTRAVENOUS | Status: DC | PRN
  Administered 2014-02-05 (×2): 10 mg via INTRAVENOUS
  Filled 2014-02-05: qty 4

## 2014-02-05 NOTE — Progress Notes (Signed)
Stroke Team Progress Note  HISTORY Tammy Barron is a 73 y.o. female with a history of hypertension, hyperlipidemia, and tobacco abuse who presented 01/31/2014 with 2 days of dizziness. She stated that she tried to get out of bed Tuesday morning and she had difficulty walking. She had persistent vertigo and nausea. On the day of admission it was not any better and therefore she sought further treatment in the emergency room.  She was last known well Monday 01/29/14 prior to bedtime. She was not a tPA candidate secondary to delay in arrival. She was admitted to the hospital for further evaluation and treatment.  SUBJECTIVE RN at bedside. Patient complains of headache.   OBJECTIVE Most recent Vital Signs: Filed Vitals:   02/05/14 0600 02/05/14 0745 02/05/14 0800 02/05/14 0803  BP: 180/80 184/67 184/69   Pulse: 91 80 80   Temp:    97.9 F (36.6 C)  TempSrc:    Oral  Resp: 31 15 23    Height:      Weight:      SpO2: 94% 93% 95%    CBG (last 3)  No results found for this basename: GLUCAP,  in the last 72 hours  IV Fluid Intake:   . sodium chloride (hypertonic) 75 mL/hr at 02/05/14 0824    MEDICATIONS  .  stroke: mapping our early stages of recovery book   Does not apply Once  . aspirin  325 mg Oral Daily  . atorvastatin  10 mg Oral q1800  . cycloSPORINE  1 drop Both Eyes BID  . diltiazem  30 mg Oral 4 times per day  . guaiFENesin  600 mg Oral BID  . heparin  5,000 Units Subcutaneous 3 times per day  . labetalol  100 mg Oral BID  . multivitamin with minerals  1 tablet Oral Daily   PRN:  acetaminophen, clorazepate, ondansetron (ZOFRAN) IV, senna-docusate  Diet:  Cardiac thin liquids Activity:  Up with assistance DVT Prophylaxis:  Subcutaneous heparin  CLINICALLY SIGNIFICANT STUDIES Basic Metabolic Panel:   Recent Labs Lab 02/03/14 0500  02/04/14 0235  02/04/14 2058 02/05/14 0315  NA 147  148*  < > 149*  < > 146 148*  K 3.4*  --  3.6*  --   --  3.9  CL 111  --  114*   --   --  112  CO2 24  --  25  --   --  27  GLUCOSE 139*  --  109*  --   --  104*  BUN 8  --  9  --   --  10  CREATININE 0.57  --  0.55  --   --  0.57  CALCIUM 7.9*  --  7.8*  --   --  8.1*  MG 1.7  --  2.0  --   --   --   < > = values in this interval not displayed. Liver Function Tests:   Recent Labs Lab 01/31/14 1112  AST 20  ALT 12  ALKPHOS 52  BILITOT 0.5  PROT 7.6  ALBUMIN 3.6   CBC:   Recent Labs Lab 01/31/14 1112 01/31/14 2255 02/05/14 0315  WBC 12.1* 12.2* 6.9  NEUTROABS 10.7*  --   --   HGB 15.4* 13.5 10.7*  HCT 43.4 38.4 32.7*  MCV 87.3 88.7 93.7  PLT 198 181 130*   Coagulation:   Recent Labs Lab 01/31/14 1233  LABPROT 13.3  INR 1.03   Cardiac Enzymes: No results found  for this basename: CKTOTAL, CKMB, CKMBINDEX, TROPONINI,  in the last 168 hours Urinalysis:   Recent Labs Lab 01/31/14 1444 02/01/14 1405  COLORURINE YELLOW YELLOW  LABSPEC 1.022 1.021  PHURINE 5.5 6.0  GLUCOSEU NEGATIVE NEGATIVE  HGBUR NEGATIVE NEGATIVE  BILIRUBINUR NEGATIVE NEGATIVE  KETONESUR 40* NEGATIVE  PROTEINUR 30* NEGATIVE  UROBILINOGEN 0.2 0.2  NITRITE NEGATIVE NEGATIVE  LEUKOCYTESUR NEGATIVE NEGATIVE   Lipid Panel    Component Value Date/Time   CHOL 209* 02/01/2014 0450   TRIG 189* 02/01/2014 0450   HDL 45 02/01/2014 0450   CHOLHDL 4.6 02/01/2014 0450   VLDL 38 02/01/2014 0450   LDLCALC 126* 02/01/2014 0450   HgbA1C  Lab Results  Component Value Date   HGBA1C 5.3 02/01/2014    Urine Drug Screen:   No results found for this basename: labopia,  cocainscrnur,  labbenz,  amphetmu,  thcu,  labbarb    Alcohol Level: No results found for this basename: ETH,  in the last 168 hours  Ct Head Wo Contrast 02/03/2014 Continued interval evolution of right posterior inferior cerebral artery territory infarct with similar mass effect on the adjacent fourth ventricle with similar hydrocephalus. No hemorrhagic conversion. Effacement of the pre pontine cistern with mass effect on the  basilar artery is similar. 02/01/2014   Evolving right posterior inferior cerebellar artery territory infarct with worsening posterior fossa mass effect, increasing hydrocephalus. No hemorrhagic conversion. Effaced pre pontine cistern with mass effect on the basilar artery.   01/31/2014   Extensive right cerebellar edema causing mass effect and effacement on the fourth ventricle and relative hydrocephalus of the lateral and third ventricles. The appearance in the right cerebellum is concerning for potential mass, although in acute infarct could present similarly. There may be some hemorrhage along the periphery of the right cerebellum in this lesion. No other hemorrhage seen. MR correlation with and without intravenous contrast material advised.  Elsewhere, there is mild small vessel disease in the centra semiovale bilaterally. The lateral and third ventricle are midline in location.   Mri Head Wo Contrast 01/31/2014    Acute right PICA infarct in the cerebellum without hemorrhage. There is edema and mass-effect on the fourth ventricle without hydrocephalus.  There is generalized atrophy.  Negative for hemorrhage or mass.     Mra Brain W Wo Contrast 01/31/2014    Small and irregular distal right vertebral artery with occlusion of the right PICA consistent with acute infarct.      Dg Chest Port 1 View 01/31/2014    1. The tip of the left IJ approach central venous catheter overlies the left innominate vein. 2. Aortic atherosclerosis. 3. No acute cardiopulmonary process.     2D Echocardiogram  - ejection fraction 55-60%. Grade 1 diastolic dysfunction. No cardiac source of emboli identified.  Carotid Doppler  - Preliminary report: Right: 60-79% ICA stenosis. Left: Unable to visualize due to central line/dressings. Right vertebral retrograde. Left vertebral antegrade.   EKG sinus rhythm rate 83 beats per minute. For complete results please see formal report.   Therapy Recommendations - inpatient  rehabilitation   Physical Exam   Mental Status:  Patient is awake and alert. She follows all commands. No signs of aphasia or neglect  Cranial Nerves:  II: Visual Fields are full. Pupils are equal, round, and reactive to light. Discs are difficult to visualize.  III,IV, VI: EOMI without ptosis or diploplia.  V: Facial sensation is symmetric to temperature  VII: Facial movement is symmetric.  VIII: hearing is intact  to voice  X: Uvula elevates symmetrically  XI: Shoulder shrug is symmetric.  XII: tongue is midline without atrophy or fasciculations.  Motor:  Tone is normal. Bulk is normal. 5/5 strength was present in all four extremities.  Sensory:  Sensation is symmetric to light touch and temperature in the arms and legs.  Deep Tendon Reflexes:  2+ and symmetric in the biceps and patellae.  Plantars:  Toes are downgoing bilaterally.  Cerebellar:  FNF and HKS are intact on left, dysmetria present on right.  Gait:  Not tested.   ASSESSMENT Tammy Barron is a 73 y.o. female presenting with dizziness. Patient with an acute right PICA infarct in the cerebellum without hemorrhage.  Imaging confirms a right PICA infarct with cerebral cytotoxic edema and mass effect on the 4th ventricle. Increasing hydrocephalus the next a.m. Placed on 3% saline.  Infarct found to be embolic secondary to new onset atrial fibrillation in the hospital. On no antithrombotic prior to admission. Now on aspirin 325 mg orally every day for secondary stroke prevention. Patient with resultant continued dizziness, headache. Stroke work up underway.   Induced hypernatremia  3% protocol started to decrease cerebral edema  Na 148  Right ICA stenosis 60-79% ICA by Doppler, incidental finding. Will need outpatient monitoring at discharge.  Fluid overload  Wheezing, Cough   Hypokalemia, resolved   atrial fibrillation, new dx  Back in NSR w/ cardizem drip  Hypertension history  Hyperlipidemia -  cholesterol 209; LDL 126  Now on Lipitor 10 mg daily, Simvastatin changed to atorvastatin per policy due to interaction with diltiazem gtt.    Hemoglobin A1c 5.3  Tobacco abuse  Obesity, Body mass index is 33.28 kg/(m^2).   Hospital day # 5  TREATMENT/PLAN  Continue aspirin 325 mg orally every day for secondary stroke prevention. Consider anticoagulation for atrial fibrillation, timing pending pt progress  Discontinue hypertonic saline per protocol  Lasix 10 mg IV x 1  Stop labetalol. Resume home BP meds per Fadumo Heng  SBP goal < 180  Risk factor modification - tobacco cessation.  Inpatient rehabilitation consult in place.  Transfer pt once off 3% (anticipte tomorrow am)  Annie Main, MSN, RN, ANVP-BC, ANP-BC, GNP-BC [facility] Pager: [phone] 02/05/2014 9:12 AM This patient is critically ill and at significant risk of neurological worsening, death and care requires constant monitoring of vital signs, hemodynamics,respiratory and cardiac monitoring,review of multiple databases, neurological assessment, discussion with family, other specialists and medical decision making of high complexity. I spent 30 minutes of neurocritical care time  in the care of  this patient. I have personally obtained a history, examined the patient, evaluated imaging results, and formulated the assessment and plan of care. I agree with the above.  To contact Stroke Continuity provider, please refer to [email or web address]. After hours, contact General Neurology

## 2014-02-05 NOTE — Progress Notes (Signed)
Physical Therapy Treatment Patient Details Name: Tammy ClarksMargaret L Barron MRN: [id number] DOB: [birth year] Today's Date: 02/05/2014    History of Present Illness Past medical history of Anxiety; Depression; Hypertension; Hyperlipidemia, tobacco abuse who presented with a few days of "dizziness". 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     PT Comments    Patient making progress with mobility and ambulation.  Continue to recommend Inpatient Rehab stay prior to discharge home.  Follow Up Recommendations  CIR     Equipment Recommendations  Rolling walker with 5" wheels    Recommendations for Other Services Rehab consult     Precautions / Restrictions Precautions Precautions: Fall Precaution Comments: Cerebellar stroke - ataxia Restrictions Weight Bearing Restrictions: No    Mobility  Bed Mobility Overal bed mobility: Needs Assistance Bed Mobility: Supine to Sit     Supine to sit: Min assist Sit to supine: Min assist   General bed mobility comments: Min assist for safety  Transfers Overall transfer level: Needs assistance Equipment used: Rolling walker (2 wheeled) Transfers: Sit to/from Stand Sit to Stand: Min assist Stand pivot transfers: Min assist       General transfer comment: Verbal cues for hand placement and safety.  Assist for balance/safety  Ambulation/Gait Ambulation/Gait assistance: Min assist;+2 safety/equipment (+1 chair) Ambulation Distance (Feet): 12 Feet Assistive device: Rolling walker (2 wheeled) Gait Pattern/deviations: Step-through pattern;Decreased step length - right;Decreased step length - left;Decreased stride length;Ataxic;Trunk flexed Gait velocity: Slow Gait velocity interpretation: Below normal speed for age/gender General Gait Details: Verbal cues and physical assist for safe use of RW.  Cues to stand upright and stay close to RW.  Patient taking short shuffling steps.  Assist for balance/safety.  During gait, patient  reports blurred vision.  Improved once sitting. Notified RN.   Stairs            Wheelchair Mobility    Modified Rankin (Stroke Patients Only)       Balance Overall balance assessment: Needs assistance Sitting-balance support: Bilateral upper extremity supported Sitting balance-Leahy Scale: Fair     Standing balance support: Bilateral upper extremity supported Standing balance-Leahy Scale: Poor                      Cognition Arousal/Alertness: Awake/alert Behavior During Therapy: Impulsive Overall Cognitive Status: Impaired/Different from baseline Area of Impairment: Safety/judgement;Problem solving         Safety/Judgement: Decreased awareness of safety   Problem Solving: Slow processing;Requires verbal cues;Requires tactile cues      Exercises      General Comments        Pertinent Vitals/Pain BP 166/69 after ambulation (similar to earlier readings).  RN notified.    Home Living                      Prior Function            PT Goals (current goals can now be found in the care plan section) Progress towards PT goals: Progressing toward goals    Frequency  Min 4X/week    PT Plan Current plan remains appropriate    Co-evaluation             End of Session Equipment Utilized During Treatment: Gait belt;Oxygen Activity Tolerance: Patient limited by fatigue Patient left: in chair;with call bell/phone within reach     Time: 1103-1129 PT Time Calculation (min): 26 min  Charges:  $Gait Training: 23-37 mins  G Codes:      Vena Austria 02/05/2014, 12:43 PM Durenda Hurt. Renaldo Fiddler, [facility] Acute Rehab Services Pager [phone]

## 2014-02-05 NOTE — Evaluation (Signed)
Occupational Therapy Evaluation Patient Details Name: Tammy Barron MRN: [id number] DOB: 12/29/1940 Today's Date: 02/05/2014    History of Present Illness Past medical history of Anxiety; Depression; Hypertension; Hyperlipidemia, tobacco abuse who presented with a few days of "dizziness". 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    Clinical Impression   Pt admitted with above. She demonstrates the below listed deficits and will benefit from continued OT to maximize safety and independence with BADLs.  Pt demonstrates improved trunk control this date and improving ability to perform BADLs.  Currently, she requires mod A for LB ADLs.  Pt with c/o diplopia with Lt gaze and shadowing at other times.  Also, complains of dizziness today.  Continue to recommend CIR to allow pt to maximize independence with BADLs.        Follow Up Recommendations  CIR;Supervision/Assistance - 24 hour    Equipment Recommendations  None recommended by OT    Recommendations for Other Services Rehab consult     Precautions / Restrictions Precautions Precautions: Fall Precaution Comments: Cerebellar stroke - ataxia      Mobility Bed Mobility Overal bed mobility: Needs Assistance Bed Mobility: Sit to Supine       Sit to supine: Min assist   General bed mobility comments: min A to prevent pt from sliding off EOB  Transfers Overall transfer level: Needs assistance Equipment used: None Transfers: Sit to/from [employer] Sit to Stand: Min assist Stand pivot transfers: Min assist       General transfer comment: min A for balance.     Balance Overall balance assessment: Needs assistance Sitting-balance support: Bilateral upper extremity supported Sitting balance-Leahy Scale: Fair     Standing balance support: Bilateral upper extremity supported Standing balance-Leahy Scale: Poor                              ADL Overall ADL's : Needs  assistance/impaired Eating/Feeding: Set up;Sitting   Grooming: Supervision/safety;Sitting;Brushing hair;Wash/dry face;Wash/dry hands   Upper Body Bathing: Moderate assistance;Sitting   Lower Body Bathing: Moderate assistance;Sit to/from stand   Upper Body Dressing : Moderate assistance;Sitting   Lower Body Dressing: Moderate assistance   Toilet Transfer: Minimal assistance;Stand-pivot;BSC     Toileting - Clothing Manipulation Details (indicate cue type and reason): Max     Functional mobility during ADLs: Minimal assistance (for stand pivot transfer to bed) General ADL Comments: Pt sitting up in chair fatigued.  Pt able to don/doff socks with DOE 3/4.  Required sitting rest break.       Vision                 Additional Comments: Pt complaint of diplopia with lt gaze and intermittent dizziness today.     Perception     Praxis      Pertinent Vitals/Pain Pt with no c/o pain.      Hand Dominance     Extremity/Trunk Assessment             Communication     Cognition Arousal/Alertness: Awake/alert Behavior During Therapy: Impulsive Overall Cognitive Status: Impaired/Different from baseline           Safety/Judgement: Decreased awareness of safety   Problem Solving: Slow processing;Requires verbal cues;Requires tactile cues     General Comments       Exercises       Shoulder Instructions      Home Living  Prior Functioning/Environment               OT Diagnosis:     OT Problem List:     OT Treatment/Interventions:      OT Goals(Current goals can be found in the care plan section) ADL Goals Pt Will Perform Grooming: with set-up;sitting;with supervision Pt Will Perform Upper Body Bathing: with set-up;with supervision;sitting Pt Will Perform Lower Body Bathing: with min assist;sit to/from stand Pt Will Transfer to Toilet: with min assist;bedside commode;stand pivot  transfer Pt Will Perform Toileting - Clothing Manipulation and hygiene: with min assist;sit to/from stand Additional ADL Goal #1: Pt will complete gaze stabilization exercises with S  OT Frequency: Min 3X/week   Barriers to D/C:            Co-evaluation              End of Session Nurse Communication: Mobility status  Activity Tolerance: Patient tolerated treatment well Patient left: in bed;with call bell/phone within reach   Time: [id number] OT Time Calculation (min): 24 min Charges:  OT General Charges $OT Visit: 1 Procedure OT Treatments $Therapeutic Activity: 23-37 mins G-Codes:    Curren Mohrmann M 02/05/2014, 12:32 PM

## 2014-02-05 NOTE — Consult Note (Signed)
Physical Medicine and Rehabilitation Consult  Reason for Consult: Ataxia with dizziness  Referring Physician: Dr. Sharon Seller   HPI: Tammy Barron is a 73 y.o. female with history of HTN, anxiety/depression, tobacco abuse who was admitted on 01/31/14 with two day history of dizziness, N &V progressing to difficulty walking. CT head with extensive right cerebellar edema causing mass effect and effacement on the fourth ventricle and relative hydrocephalus of the lateral and third ventricles and concern of potential mass right cerebellum v/s acute infarct. MRI/MRA brain with acute R-PICA infarct in cerebellum with edema and mass effect on 4th ventricle without hydro and occlusion of R-PICA c/w acute infarct. She was started on hypertonic saline for dema control and ASA 325 mg for secondary stroke prevention.  2D echo with EF 55-65% with no wall abnormalities and grade 1 diastolic dysfunction. Carotid dopplers with 60-79% R- ICA stenosis, R-VA retrograde flow and inability to visualize L-ICA due to dressings. New onset A fib treated with IV Cardizem. Follow up CCT with evolution and worsening of posterior mass effect with increasing hydro and no hemorrhagic conversion. Patient stable neurologically and being monitored in ICU. Stroke felt to be cardioembolic. She continues to have ataxic gait and PT, MD recommending CIR    Review of Systems  Constitutional: Positive for malaise/fatigue.  Neurological: Positive for focal weakness.    Past Medical History  Diagnosis Date  . Anxiety   . Depression   . Hypertension   . Hyperlipidemia   . Hx of abnormal Pap smear    Past Surgical History  Procedure Laterality Date  . Abdominal hysterectomy  1982   Family History  Problem Relation Age of Onset  . Heart disease Mother   . Heart attack Father    Social History:  Lives alone.  reports that she has been smoking Cigarettes.  She has been smoking about 1.00 pack per day. She has never used  smokeless tobacco. She reports that she does not drink alcohol. Her drug history is not on file.   Allergies  Allergen Reactions  . Latex Rash   Medications Prior to Admission  Medication Sig Dispense Refill  . bisoprolol-hydrochlorothiazide (ZIAC) 10-6.25 MG per tablet Take 1 tablet by mouth daily.      . clorazepate (TRANXENE) 3.75 MG tablet Take 1 tablet (3.75 mg total) by mouth 2 (two) times daily as needed for anxiety.  30 tablet  5  . cycloSPORINE (RESTASIS) 0.05 % ophthalmic emulsion Place 1 drop into both eyes 2 (two) times daily.      . meloxicam (MOBIC) 15 MG tablet Take 15 mg by mouth daily.       . Multiple Vitamin (MULTIVITAMIN) tablet Take 1 tablet by mouth daily.        Home: Home Living Family/patient expects to be discharged to:: Inpatient rehab Living Arrangements: Alone Available Help at Discharge: Other (Comment) Additional Comments: Has DME - RW, BSC, shower chair, lift chair  Lives With: Alone  Functional History: Prior Function Level of Independence: Independent Comments: son present during session and is a Administrator, arts  Functional Status:  Mobility: Bed Mobility Overal bed mobility: Needs Assistance Bed Mobility: Supine to Sit Supine to sit: Min assist Sit to supine: Mod assist General bed mobility comments: Verbal cues to slow down and wait until prep completed prior to sitting.  Patient able to move to sitting using bed rail.  Min assist for balance/safety. Transfers Overall transfer level: Needs assistance Equipment used: Rolling walker (2 wheeled)  Transfers: Sit to/from Stand Sit to Stand: Min assist;+2 physical assistance Stand pivot transfers: +2 physical assistance;Mod assist General transfer comment: Verbal cues for hand placement.  Assist to rise to standing and for balance.  Once upright, balance fair.   Ambulation/Gait Ambulation/Gait assistance: Min assist;+2 safety/equipment (+1 chair) Ambulation Distance (Feet): 6 Feet Assistive device:  Rolling walker (2 wheeled) Gait Pattern/deviations: Step-through pattern;Decreased step length - right;Decreased step length - left;Decreased stride length;Shuffle;Trunk flexed;Ataxic Gait velocity: Slow Gait velocity interpretation: Below normal speed for age/gender General Gait Details: Verbal cues and physical assist for safe use of RW.  Cues to stand upright and stay close to RW.  Patient taking short shuffling steps.  Assist for balance/safety.    ADL: ADL Overall ADL's : Needs assistance/impaired Eating/Feeding: Set up Grooming: Minimal assistance;Sitting Upper Body Bathing: Moderate assistance;Sitting Lower Body Bathing: Maximal assistance;Sit to/from stand Upper Body Dressing : Moderate assistance;Sitting Lower Body Dressing: Sit to/from stand;+2 for physical assistance;Maximal assistance Toilet Transfer: Maximal assistance;Stand-pivot;BSC Toilet Transfer Details (indicate cue type and reason): pt kept close to therapists body to increase trunk support due to ataxia Toileting - Clothing Manipulation Details (indicate cue type and reason): Max Functional mobility during ADLs: Maximal assistance;+2 for safety/equipment General ADL Comments: Pt with improved performance with support. Began education on using focal point to decrease c/o dizziness during mobility  Cognition: Cognition Overall Cognitive Status: Impaired/Different from baseline Arousal/Alertness: Awake/alert (drowsy) Orientation Level: Oriented X4 Attention: Sustained Sustained Attention: Appears intact Memory:  (to assess further) Awareness: Appears intact Problem Solving: Impaired Problem Solving Impairment: Verbal basic Safety/Judgment: Appears intact Cognition Arousal/Alertness: Awake/alert Behavior During Therapy: Impulsive Overall Cognitive Status: Impaired/Different from baseline Area of Impairment: Orientation;Safety/judgement;Problem solving Orientation Level: Disoriented to;Time Safety/Judgement:  Decreased awareness of safety Problem Solving: Slow processing;Requires verbal cues;Requires tactile cues  Blood pressure 180/80, pulse 91, temperature 98.2 F (36.8 C), temperature source Oral, resp. rate 31, height 5' 4.5" (1.638 m), weight 89.3 kg (196 lb 13.9 oz), SpO2 94.00%. Physical Exam  Constitutional: She appears well-developed and well-nourished.  HENT:  Head: Normocephalic and atraumatic.  Eyes: Conjunctivae and EOM are normal. Pupils are equal, round, and reactive to light.  Neck: No JVD present. No tracheal deviation present.  Cardiovascular: Normal rate.   Respiratory: Effort normal.  GI: She exhibits no distension.  Neurological:  Reasonable fine motor control of bilateral upper ext and le. Right limbs slightly impaired comparatively to the left. Speech a little dysarthric/dysphonic  Psychiatric: She has a normal mood and affect. Her behavior is normal. Judgment and thought content normal.    Results for orders placed during the hospital encounter of 01/31/14 (from the past 24 hour(s))  SODIUM     Status: None   Collection Time    02/04/14  9:14 AM      Result Value Ref Range   Sodium 146  137 - 147 mEq/L  SODIUM     Status: Abnormal   Collection Time    02/04/14  3:17 PM      Result Value Ref Range   Sodium 148 (*) 137 - 147 mEq/L  SODIUM     Status: None   Collection Time    02/04/14  8:58 PM      Result Value Ref Range   Sodium 146  137 - 147 mEq/L  BASIC METABOLIC PANEL     Status: Abnormal   Collection Time    02/05/14  3:15 AM      Result Value Ref Range   Sodium 148 (*)  137 - 147 mEq/L   Potassium 3.9  3.7 - 5.3 mEq/L   Chloride 112  96 - 112 mEq/L   CO2 27  19 - 32 mEq/L   Glucose, Bld 104 (*) 70 - 99 mg/dL   BUN 10  6 - 23 mg/dL   Creatinine, Ser 1.610.57  0.50 - 1.10 mg/dL   Calcium 8.1 (*) 8.4 - 10.5 mg/dL   GFR calc non Af Amer 89 (*) >90 mL/min   GFR calc Af Amer >90  >90 mL/min  CBC     Status: Abnormal   Collection Time    02/05/14  3:15  AM      Result Value Ref Range   WBC 6.9  4.0 - 10.5 K/uL   RBC 3.49 (*) 3.87 - 5.11 MIL/uL   Hemoglobin 10.7 (*) 12.0 - 15.0 g/dL   HCT 09.632.7 (*) 04.536.0 - 40.946.0 %   MCV 93.7  78.0 - 100.0 fL   MCH 30.7  26.0 - 34.0 pg   MCHC 32.7  30.0 - 36.0 g/dL   RDW 81.113.5  91.411.5 - 78.215.5 %   Platelets 130 (*) 150 - 400 K/uL   Dg Chest 2 View  02/03/2014   CLINICAL DATA:  Shortness of breath  EXAM: CHEST  2 VIEW  COMPARISON:  01/31/2014  FINDINGS: A left jugular central line is again noted in the left innominate vein. It has withdrawn slightly in the interval from the prior exam. Cardiac shadow is within normal limits. The lungs are clear bilaterally. No focal infiltrate or sizable effusion is seen. No acute bony abnormality is noted.  IMPRESSION: No acute abnormality seen.   Electronically Signed   By: Alcide CleverMark  Lukens M.D.   On: 02/03/2014 11:54    Assessment/Plan: Diagnosis: right PICA infarct 1. Does the need for close, 24 hr/day medical supervision in concert with the patient's rehab needs make it unreasonable for this patient to be served in a less intensive setting? Yes 2. Co-Morbidities requiring supervision/potential complications: anxiety, htn, depression 3. Due to bladder management, bowel management, safety, skin/wound care, disease management, medication administration and patient education, does the patient require 24 hr/day rehab nursing? Yes 4. Does the patient require coordinated care of a physician, rehab nurse, PT (1-2 hrs/day, 5 days/week), OT (1-2 hrs/day, 5 days/week) and SLP (1-2 hrs/day, 5 days/week) to address physical and functional deficits in the context of the above medical diagnosis(es)? Yes Addressing deficits in the following areas: balance, endurance, locomotion, strength, transferring, bowel/bladder control, bathing, dressing, feeding, grooming, toileting, speech and psychosocial support 5. Can the patient actively participate in an intensive therapy program of at least 3 hrs of  therapy per day at least 5 days per week? Yes 6. The potential for patient to make measurable gains while on inpatient rehab is excellent 7. Anticipated functional outcomes upon discharge from inpatient rehab are modified independent and supervision  with PT, modified independent and supervision with OT, modified independent with SLP. 8. Estimated rehab length of stay to reach the above functional goals is: 10-13 days 9. Does the patient have adequate social supports to accommodate these discharge functional goals? Potentially 10. Anticipated D/C setting: Home 11. Anticipated post D/C treatments: HH therapy, outpt 12. Overall Rehab/Functional Prognosis: excellent  RECOMMENDATIONS: This patient's condition is appropriate for continued rehabilitative care in the following setting: CIR Patient has agreed to participate in recommended program. Yes Note that insurance prior authorization may be required for reimbursement for recommended care.  Comment: Pt will needs some  friends or neighbors to help at the time of dc. Rehab Admissions Coordinator to follow up.  Thanks,  Ranelle Oyster, MD, [place] [facility]     02/05/2014

## 2014-02-05 NOTE — Progress Notes (Signed)
Progress Note [place] TEAM 1 - Stepdown/ICU TEAM  Tammy Barron:[id number] DOB: 03-Jan-1941 DOA: 01/31/2014 PCP: Rogelia Boga, MD  Brief narrative: 73 y.o. female who presented on 01/31/2014 with a history of Anxiety; Depression; Hypertension; Hyperlipidemia, and tobacco abuse w/ c/o a few days of "dizziness" which was c/w vertigo. She was found to have a right PICA infarct with mass effect and hydrocephalus. Right PICA noted to be occluded - suspected by Neurology to be an embolic event.  The pt was transferred to the ICU after admission due to increasing lethargy.  Subjective: No new complaints today.  The patient is anxious to begin the next step in her recovery.  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 - 3% saline + full dose ASA per Neuro - hopeful 3% saline can be stopped today - Neuro had plans for TEE with implantation of loop to r/o afib/arrythmia, but pt went briefly into afib during the hospital stay therefore this is no longer felt to be indicated - Anticipate eventual discharge to CIR  Newly appreciated Afib  Control lytes - TSH normal - remains in NSR at present - systemic anticoag timing to be directed by Neurology   Hypokalemia - cont to replace with goal of 4.0 or > - Mg at goal   HYPERLIPIDEMIA - statin started this admission  ANXIETY - PRN Tranxene - well managed at this time  Tobacco abuse - prn nebs + IS + flutter valve - counseled on need to d/c tobacco   HYPERTENSION - slowly adjusting tx to accomplish better control w/ care not to overshoot - no change in tx plan today  Code Status: NO CPR/cardioversion per Dr Alene Mires discussion with pt - see his note  Family Communication: no family present at time of exam today  Disposition Plan: Follow in ICU due to hypertonic saline - ultimate plan is for d/c to CIR   Consultants: Stroke Team   Procedures: TTE - 4/2 - EF 55% to 60% - wall motion was  normal - grade 1 diastolic dysfunction - no evidence of embolic source   Carotid dopplers :  60 - 79 percent stenosisinvolving the right internal carotid artery - unable to visualize left ICA due to central line and dressing  Antibiotics: None  DVT prophylaxis: SQ Heparin  Objective: Filed Weights   01/31/14 1900 02/03/14 0500  Weight: 86.3 kg (190 lb 4.1 oz) 89.3 kg (196 lb 13.9 oz)   Blood pressure 163/69, pulse 65, temperature 97.9 F (36.6 C), temperature source Oral, resp. rate 19, height 5' 4.5" (1.638 m), weight 89.3 kg (196 lb 13.9 oz), SpO2 94.00%.  Intake/Output Summary (Last 24 hours) at 02/05/14 1201 Last data filed at 02/05/14 1100  Gross per 24 hour  Intake   2614 ml  Output   1910 ml  Net    704 ml   Exam: General: no acute respiratory distress - alert and conversant  Lungs: CTA th/o w/o wheeze  Cardiovascular: regular rate and rhythm at present without murmur gallop or rub Abdomen: nontender, nondistended, soft, bowel sounds positive, no rebound, no ascites, no appreciable mass Extremities: no significant cyanosis, clubbing, or edema bilateral lower extremities  Data Reviewed: Basic Metabolic Panel:  Recent Labs Lab 01/31/14 1112 01/31/14 2255  02/02/14 1520 02/02/14 1720 02/03/14 0500  02/04/14 0235 02/04/14 0914 02/04/14 1517 02/04/14 2058 02/05/14 0315 02/05/14 0910  NA 136* 139  < > 143 146 147  148*  < > 149* 146 148*  146 148* 148*  K 3.4*  --   --   --  2.9* 3.4*  --  3.6*  --   --   --  3.9  --   CL 94*  --   --   --  107 111  --  114*  --   --   --  112  --   CO2 24  --   --   --  26 24  --  25  --   --   --  27  --   GLUCOSE 171*  --   --   --  101* 139*  --  109*  --   --   --  104*  --   BUN 29*  --   --   --  7 8  --  9  --   --   --  10  --   CREATININE 0.69 0.66  --   --  0.50 0.57  --  0.55  --   --   --  0.57  --   CALCIUM 9.8  --   --   --  7.9* 7.9*  --  7.8*  --   --   --  8.1*  --   MG  --   --   --   --  1.7 1.7  --  2.0   --   --   --   --   --   < > = values in this interval not displayed.  Liver Function Tests:  Recent Labs Lab 01/31/14 1112  AST 20  ALT 12  ALKPHOS 52  BILITOT 0.5  PROT 7.6  ALBUMIN 3.6    Recent Labs Lab 01/31/14 1112  LIPASE 16   CBC:  Recent Labs Lab 01/31/14 1112 01/31/14 2255 02/05/14 0315  WBC 12.1* 12.2* 6.9  NEUTROABS 10.7*  --   --   HGB 15.4* 13.5 10.7*  HCT 43.4 38.4 32.7*  MCV 87.3 88.7 93.7  PLT 198 181 130*    Recent Results (from the past 240 hour(s))  MRSA PCR SCREENING     Status: None   Collection Time    01/31/14  7:00 PM      Result Value Ref Range Status   MRSA by PCR NEGATIVE  NEGATIVE Final   Comment:            The GeneXpert MRSA Assay (FDA     approved for NASAL specimens     only), is one component of a     comprehensive MRSA colonization     surveillance program. It is not     intended to diagnose MRSA     infection nor to guide or     monitor treatment for     MRSA infections.     Studies:  Recent x-ray studies have been reviewed in detail by the Attending Physician  Scheduled Meds:  Scheduled Meds: .  stroke: mapping our early stages of recovery book   Does not apply Once  . aspirin  325 mg Oral Daily  . atorvastatin  10 mg Oral q1800  . bisoprolol-hydrochlorothiazide  1 tablet Oral Daily  . cycloSPORINE  1 drop Both Eyes BID  . diltiazem  30 mg Oral 4 times per day  . furosemide  10 mg Intravenous Once  . guaiFENesin  600 mg Oral BID  . heparin  5,000 Units Subcutaneous 3 times per day  . multivitamin with minerals  1 tablet Oral Daily   Continuous Infusions: . sodium chloride (hypertonic) 75 mL/hr at 02/05/14 4098    Time spent on care of this patient: 25 mins   [facility], MD  [facility] Office  [phone] Pager - Text Page per Loretha Stapler as per below:  On-Call/Text Page:      [email or web address]  If 7PM-7AM, please contact night-coverage [email or web address]  02/05/2014, 12:01 PM   LOS: 5 days

## 2014-02-05 NOTE — Progress Notes (Signed)
Rehab admissions - Evaluated for possible admission.  I met with patient.  I also spoke with patient's daughter by phone.  Dtr would like inpatient rehab here in the hospital and then plans to assist her mom at home after discharge.  I have called and opened the case with [facility] and will update all once I hear back from insurance case manager.  Call me for questions.  #[phone]

## 2014-02-06 ENCOUNTER — Encounter (HOSPITAL_COMMUNITY): Payer: Self-pay | Admitting: *Deleted

## 2014-02-06 ENCOUNTER — Inpatient Hospital Stay (HOSPITAL_COMMUNITY): Payer: Medicare Other

## 2014-02-06 ENCOUNTER — Inpatient Hospital Stay (HOSPITAL_COMMUNITY)
Admission: RE | Admit: 2014-02-06 | Discharge: 2014-02-16 | DRG: 945 | Disposition: A | Discharge status: Home Health | Payer: Medicare Other | Source: Intra-hospital | Attending: Physical Medicine & Rehabilitation | Admitting: Physical Medicine & Rehabilitation

## 2014-02-06 DIAGNOSIS — I480 Paroxysmal atrial fibrillation: Secondary | ICD-10-CM | POA: Diagnosis present

## 2014-02-06 DIAGNOSIS — F17200 Nicotine dependence, unspecified, uncomplicated: Secondary | ICD-10-CM | POA: Diagnosis present

## 2014-02-06 DIAGNOSIS — D62 Acute posthemorrhagic anemia: Secondary | ICD-10-CM | POA: Diagnosis present

## 2014-02-06 DIAGNOSIS — F329 Major depressive disorder, single episode, unspecified: Secondary | ICD-10-CM | POA: Diagnosis present

## 2014-02-06 DIAGNOSIS — I6529 Occlusion and stenosis of unspecified carotid artery: Secondary | ICD-10-CM | POA: Diagnosis present

## 2014-02-06 DIAGNOSIS — E870 Hyperosmolality and hypernatremia: Secondary | ICD-10-CM | POA: Diagnosis present

## 2014-02-06 DIAGNOSIS — F411 Generalized anxiety disorder: Secondary | ICD-10-CM | POA: Diagnosis present

## 2014-02-06 DIAGNOSIS — F419 Anxiety disorder, unspecified: Secondary | ICD-10-CM | POA: Diagnosis present

## 2014-02-06 DIAGNOSIS — J4489 Other specified chronic obstructive pulmonary disease: Secondary | ICD-10-CM | POA: Diagnosis present

## 2014-02-06 DIAGNOSIS — J449 Chronic obstructive pulmonary disease, unspecified: Secondary | ICD-10-CM | POA: Diagnosis present

## 2014-02-06 DIAGNOSIS — R42 Dizziness and giddiness: Secondary | ICD-10-CM | POA: Diagnosis present

## 2014-02-06 DIAGNOSIS — I4891 Unspecified atrial fibrillation: Secondary | ICD-10-CM | POA: Diagnosis present

## 2014-02-06 DIAGNOSIS — N390 Urinary tract infection, site not specified: Secondary | ICD-10-CM | POA: Diagnosis not present

## 2014-02-06 DIAGNOSIS — R350 Frequency of micturition: Secondary | ICD-10-CM | POA: Diagnosis present

## 2014-02-06 DIAGNOSIS — K5900 Constipation, unspecified: Secondary | ICD-10-CM | POA: Diagnosis present

## 2014-02-06 DIAGNOSIS — R062 Wheezing: Secondary | ICD-10-CM | POA: Diagnosis present

## 2014-02-06 DIAGNOSIS — I69993 Ataxia following unspecified cerebrovascular disease: Secondary | ICD-10-CM

## 2014-02-06 DIAGNOSIS — R262 Difficulty in walking, not elsewhere classified: Secondary | ICD-10-CM | POA: Diagnosis present

## 2014-02-06 DIAGNOSIS — F3289 Other specified depressive episodes: Secondary | ICD-10-CM | POA: Diagnosis present

## 2014-02-06 DIAGNOSIS — I10 Essential (primary) hypertension: Secondary | ICD-10-CM | POA: Diagnosis present

## 2014-02-06 DIAGNOSIS — Z79899 Other long term (current) drug therapy: Secondary | ICD-10-CM | POA: Diagnosis not present

## 2014-02-06 DIAGNOSIS — E876 Hypokalemia: Secondary | ICD-10-CM | POA: Diagnosis not present

## 2014-02-06 DIAGNOSIS — D696 Thrombocytopenia, unspecified: Secondary | ICD-10-CM | POA: Diagnosis not present

## 2014-02-06 DIAGNOSIS — R269 Unspecified abnormalities of gait and mobility: Secondary | ICD-10-CM | POA: Diagnosis present

## 2014-02-06 DIAGNOSIS — I6350 Cerebral infarction due to unspecified occlusion or stenosis of unspecified cerebral artery: Secondary | ICD-10-CM

## 2014-02-06 DIAGNOSIS — I6340 Cerebral infarction due to embolism of unspecified cerebral artery: Secondary | ICD-10-CM | POA: Diagnosis present

## 2014-02-06 DIAGNOSIS — E785 Hyperlipidemia, unspecified: Secondary | ICD-10-CM | POA: Diagnosis present

## 2014-02-06 DIAGNOSIS — Z5189 Encounter for other specified aftercare: Secondary | ICD-10-CM | POA: Diagnosis present

## 2014-02-06 DIAGNOSIS — I639 Cerebral infarction, unspecified: Secondary | ICD-10-CM | POA: Diagnosis present

## 2014-02-06 LAB — URINE MICROSCOPIC-ADD ON

## 2014-02-06 LAB — BASIC METABOLIC PANEL
BUN: 10 mg/dL (ref 6–23)
CHLORIDE: 99 meq/L (ref 96–112)
CO2: 30 mEq/L (ref 19–32)
Calcium: 9.2 mg/dL (ref 8.4–10.5)
Creatinine, Ser: 0.7 mg/dL (ref 0.50–1.10)
GFR calc Af Amer: >90 mL/min (ref >90)
GFR calc non Af Amer: 83 mL/min — ABNORMAL LOW (ref >90)
Glucose, Bld: 111 mg/dL — ABNORMAL HIGH (ref 70–99)
POTASSIUM: 3.4 meq/L — AB (ref 3.7–5.3)
Sodium: 142 mEq/L (ref 137–147)

## 2014-02-06 LAB — URINALYSIS, ROUTINE W REFLEX MICROSCOPIC
BILIRUBIN URINE: NEGATIVE
Glucose, UA: NEGATIVE mg/dL
Ketones, ur: NEGATIVE mg/dL
Nitrite: NEGATIVE
PROTEIN: NEGATIVE mg/dL
Specific Gravity, Urine: 1.011 (ref 1.005–1.030)
Urobilinogen, UA: 0.2 mg/dL (ref 0.0–1.0)
pH: 7.5 (ref 5.0–8.0)

## 2014-02-06 LAB — SODIUM
Sodium: 140 mEq/L (ref 137–147)
Sodium: 141 mEq/L (ref 137–147)

## 2014-02-06 MED ORDER — FLEET ENEMA 7-19 GM/118ML RE ENEM
1.0000 | ENEMA | Freq: Once | RECTAL | Status: AC
  Administered 2014-02-07: 1 via RECTAL
  Filled 2014-02-06: qty 1

## 2014-02-06 MED ORDER — TRAZODONE HCL 50 MG PO TABS
25.0000 mg | ORAL_TABLET | Freq: Every evening | ORAL | Status: DC | PRN
  Administered 2014-02-11 – 2014-02-12 (×2): 50 mg via ORAL
  Filled 2014-02-06 (×3): qty 1

## 2014-02-06 MED ORDER — GUAIFENESIN ER 600 MG PO TB12
600.0000 mg | ORAL_TABLET | Freq: Two times a day (BID) | ORAL | Status: DC
  Administered 2014-02-06 – 2014-02-14 (×15): 600 mg via ORAL
  Filled 2014-02-06 (×24): qty 1

## 2014-02-06 MED ORDER — ENOXAPARIN SODIUM 40 MG/0.4ML ~~LOC~~ SOLN
40.0000 mg | SUBCUTANEOUS | Status: DC
  Administered 2014-02-06 – 2014-02-13 (×8): 40 mg via SUBCUTANEOUS
  Filled 2014-02-06 (×9): qty 0.4

## 2014-02-06 MED ORDER — ACETAMINOPHEN 325 MG PO TABS
650.0000 mg | ORAL_TABLET | Freq: Four times a day (QID) | ORAL | Status: DC | PRN
  Administered 2014-02-13: 650 mg via ORAL
  Filled 2014-02-06: qty 2

## 2014-02-06 MED ORDER — ASPIRIN 325 MG PO TABS: 325.0000 mg | ORAL_TABLET | Freq: Every day | ORAL | Status: DC

## 2014-02-06 MED ORDER — BISOPROLOL-HYDROCHLOROTHIAZIDE 10-6.25 MG PO TABS
1.0000 | ORAL_TABLET | Freq: Every day | ORAL | Status: DC
  Administered 2014-02-07 – 2014-02-16 (×10): 1 via ORAL
  Filled 2014-02-06 (×11): qty 1

## 2014-02-06 MED ORDER — DILTIAZEM HCL ER COATED BEADS 120 MG PO CP24: 120.0000 mg | ORAL_CAPSULE | Freq: Every day | ORAL | Status: DC

## 2014-02-06 MED ORDER — ASPIRIN 325 MG PO TABS
325.0000 mg | ORAL_TABLET | Freq: Every day | ORAL | Status: DC
  Administered 2014-02-07 – 2014-02-16 (×10): 325 mg via ORAL
  Filled 2014-02-06 (×12): qty 1

## 2014-02-06 MED ORDER — ATORVASTATIN CALCIUM 10 MG PO TABS: 10.0000 mg | ORAL_TABLET | Freq: Every day | ORAL | Status: DC

## 2014-02-06 MED ORDER — DILTIAZEM HCL ER COATED BEADS 120 MG PO CP24
120.0000 mg | ORAL_CAPSULE | Freq: Every day | ORAL | Status: DC
  Administered 2014-02-06: 120 mg via ORAL
  Filled 2014-02-06: qty 1

## 2014-02-06 MED ORDER — ALUM & MAG HYDROXIDE-SIMETH 200-200-20 MG/5ML PO SUSP: 30.0000 mL | ORAL | Status: DC | PRN

## 2014-02-06 MED ORDER — CLORAZEPATE DIPOTASSIUM 3.75 MG PO TABS: 3.7500 mg | ORAL_TABLET | Freq: Every evening | ORAL | Status: DC | PRN

## 2014-02-06 MED ORDER — DIPHENHYDRAMINE HCL 12.5 MG/5ML PO ELIX: 12.5000 mg | ORAL_SOLUTION | Freq: Four times a day (QID) | ORAL | Status: DC | PRN

## 2014-02-06 MED ORDER — ATORVASTATIN CALCIUM 10 MG PO TABS
10.0000 mg | ORAL_TABLET | Freq: Every day | ORAL | Status: DC
  Administered 2014-02-06 – 2014-02-15 (×10): 10 mg via ORAL
  Filled 2014-02-06 (×13): qty 1

## 2014-02-06 MED ORDER — BISACODYL 10 MG RE SUPP: 10.0000 mg | Freq: Every day | RECTAL | Status: DC | PRN

## 2014-02-06 MED ORDER — PROCHLORPERAZINE EDISYLATE 5 MG/ML IJ SOLN
5.0000 mg | Freq: Four times a day (QID) | INTRAMUSCULAR | Status: DC | PRN
  Filled 2014-02-06: qty 2

## 2014-02-06 MED ORDER — POLYETHYLENE GLYCOL 3350 17 G PO PACK
17.0000 g | PACK | Freq: Every day | ORAL | Status: DC
  Administered 2014-02-10 – 2014-02-14 (×4): 17 g via ORAL
  Filled 2014-02-06 (×11): qty 1

## 2014-02-06 MED ORDER — IPRATROPIUM-ALBUTEROL 0.5-2.5 (3) MG/3ML IN SOLN: 3.0000 mL | RESPIRATORY_TRACT | Status: DC | PRN

## 2014-02-06 MED ORDER — CLORAZEPATE DIPOTASSIUM 3.75 MG PO TABS
3.7500 mg | ORAL_TABLET | Freq: Two times a day (BID) | ORAL | Status: DC | PRN
  Administered 2014-02-06 – 2014-02-15 (×13): 3.75 mg via ORAL
  Filled 2014-02-06 (×13): qty 1

## 2014-02-06 MED ORDER — CYCLOSPORINE 0.05 % OP EMUL
1.0000 [drp] | Freq: Two times a day (BID) | OPHTHALMIC | Status: DC
  Administered 2014-02-07 (×2): 1 [drp] via OPHTHALMIC
  Filled 2014-02-06 (×22): qty 1

## 2014-02-06 MED ORDER — SENNOSIDES-DOCUSATE SODIUM 8.6-50 MG PO TABS
1.0000 | ORAL_TABLET | Freq: Every evening | ORAL | Status: DC | PRN
  Filled 2014-02-06: qty 1

## 2014-02-06 MED ORDER — FLEET ENEMA 7-19 GM/118ML RE ENEM
1.0000 | ENEMA | Freq: Once | RECTAL | Status: AC | PRN
  Filled 2014-02-06: qty 1

## 2014-02-06 MED ORDER — ADULT MULTIVITAMIN W/MINERALS CH
1.0000 | ORAL_TABLET | Freq: Every day | ORAL | Status: DC
  Administered 2014-02-07 – 2014-02-16 (×10): 1 via ORAL
  Filled 2014-02-06 (×11): qty 1

## 2014-02-06 MED ORDER — DILTIAZEM HCL ER COATED BEADS 120 MG PO CP24
120.0000 mg | ORAL_CAPSULE | Freq: Every day | ORAL | Status: DC
  Administered 2014-02-07 – 2014-02-16 (×10): 120 mg via ORAL
  Filled 2014-02-06 (×11): qty 1

## 2014-02-06 MED ORDER — PROCHLORPERAZINE 25 MG RE SUPP
12.5000 mg | Freq: Four times a day (QID) | RECTAL | Status: DC | PRN
  Filled 2014-02-06: qty 1

## 2014-02-06 MED ORDER — GUAIFENESIN-DM 100-10 MG/5ML PO SYRP: 5.0000 mL | ORAL_SOLUTION | Freq: Four times a day (QID) | ORAL | Status: DC | PRN

## 2014-02-06 MED ORDER — PROCHLORPERAZINE MALEATE 5 MG PO TABS
5.0000 mg | ORAL_TABLET | Freq: Four times a day (QID) | ORAL | Status: DC | PRN
  Administered 2014-02-15: 5 mg via ORAL
  Filled 2014-02-06 (×2): qty 2

## 2014-02-06 NOTE — Progress Notes (Signed)
Patient received from 57M 05 at 1517 alert and oriented x 4 . Patient oriented to room and call bell system . Patient noted with pressure dressing to left neck from old IJ removal . Patient verbalized understanding of rehab process . Continue with plan of care .                   Cleotilde NeerJoyce, Domonique Brouillard S

## 2014-02-06 NOTE — Progress Notes (Signed)
Physical Therapy Treatment Patient Details Name: Cleda ClarksMargaret L Garside MRN: [id number] DOB: 1941-08-02 Today's Date: 02/06/2014    History of Present Illness Past medical history of Anxiety; Depression; Hypertension; Hyperlipidemia, tobacco abuse who presented with a few days of "dizziness". 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     PT Comments    Pt con't to be SOB during ambulation requiring freq sitting breaks. Pt con't to be ataxic and unsteady requiring assist for safe OOB mobility. Pt con't to benefit from CIR upon d/c.  Follow Up Recommendations  CIR     Equipment Recommendations  Rolling walker with 5" wheels    Recommendations for Other Services Rehab consult     Precautions / Restrictions Precautions Precautions: Fall Precaution Comments: Cerebellar stroke - ataxia Restrictions Weight Bearing Restrictions: No    Mobility  Bed Mobility Overal bed mobility: Needs Assistance Bed Mobility: Supine to Sit     Supine to sit: Min assist     General bed mobility comments: minA for trunk elevation, max directional v/c's  Transfers Overall transfer level: Needs assistance Equipment used: Rolling walker (2 wheeled) Transfers: Sit to/from Stand Sit to Stand: Min assist         General transfer comment: v/c's for safety, HHA for stability, increased time  Ambulation/Gait Ambulation/Gait assistance: Mod assist Ambulation Distance (Feet): 50 Feet Assistive device: 2 person hand held assist Gait Pattern/deviations: Step-through pattern;Decreased stride length;Wide base of support Gait velocity: Slow   General Gait Details: min/modA at hips to cue appropriate weight-shift, v/c's to increasd step length and height to minimalize shuffling   Stairs            Wheelchair Mobility    Modified Rankin (Stroke Patients Only) Modified Rankin (Stroke Patients Only) Pre-Morbid Rankin Score: No symptoms Modified Rankin: Moderately severe  disability     Balance Overall balance assessment: Needs assistance Sitting-balance support: Feet supported Sitting balance-Leahy Scale: Fair     Standing balance support: Bilateral upper extremity supported Standing balance-Leahy Scale: Poor               High level balance activites: Backward walking (tandem walking) High Level Balance Comments: maximal assist to cues appropriate weight shift and sequencing    Cognition Arousal/Alertness: Awake/alert Behavior During Therapy: WFL for tasks assessed/performed Overall Cognitive Status: Impaired/Different from baseline Area of Impairment: Problem solving             Problem Solving: Slow processing;Difficulty sequencing;Requires verbal cues;Requires tactile cues      Exercises      General Comments        Pertinent Vitals/Pain Denies pain    Home Living                      Prior Function            PT Goals (current goals can now be found in the care plan section) Progress towards PT goals: Progressing toward goals    Frequency  Min 4X/week    PT Plan Current plan remains appropriate    Co-evaluation             End of Session Equipment Utilized During Treatment: Gait belt Activity Tolerance: Patient limited by fatigue Patient left: in chair;with call bell/phone within reach     Time: 1419-1447 PT Time Calculation (min): 28 min  Charges:  $Gait Training: 23-37 mins  G Codes:      Marcene Brawn 02/06/2014, 3:31 PM   Lewis Shock, PT, DPT Pager #: [phone] Office #: [phone]

## 2014-02-06 NOTE — PMR Pre-admission (Signed)
PMR Admission Coordinator Pre-Admission Assessment  Patient: Tammy Barron is an 73 y.o., female MRN: [id number] DOB: [birth year] Height: 5' 4.5" (163.8 cm) Weight: 89.3 kg (196 lb 13.9 oz)              Insurance Information HMO:      PPO: Yes     PCP:       IPA:       80/20:       OTHER: Group # [id number] PRIMARY: [place] complete      Policy#: [id number]      Subscriber: Forde Dandy CM Name: Geryl Rankins      Phone#: [phone]     Fax#:   Pre-Cert#: [id number]      Employer: Retired Benefits:  Phone #: [phone]     Name: Ayesha Mohair. Date: 11/02/12     Deduct: $0      Out of Pocket Max: $4000(met$0)      Life Max: unlimited CIR: $160 days 1-10      SNF: $0 days 1-20; $50 days 21-100 Outpatient: medical necessity     Co-Pay: $20/therapy visit Home Health: 100%      Co-Pay: none DME: 80%     Co-Pay: 20% Providers: in network   Emergency [place]   Name Relation Home Work Mobile   Pellerin,Jennifer Daughter   [phone]     Current Medical History  Patient Admitting Diagnosis: Right PICA infarct   History of Present Illness: A 73 y.o. female with history of HTN, anxiety/depression, tobacco abuse who was admitted on 01/31/14 with two day history of dizziness, N &V progressing to difficulty walking. CT head with extensive right cerebellar edema causing mass effect and effacement on the fourth ventricle and relative hydrocephalus of the lateral and third ventricles and concern of potential mass right cerebellum v/s acute infarct. MRI/MRA brain with acute R-PICA infarct in cerebellum with edema and mass effect on 4th ventricle without hydro and occlusion of R-PICA c/w acute infarct. She was started on hypertonic saline for dema control and ASA 325 mg for secondary stroke prevention. 2D echo with EF 55-65% with no wall abnormalities and grade 1 diastolic dysfunction. Carotid dopplers with 60-79% R- ICA stenosis, R-VA retrograde flow and inability  to visualize L-ICA due to dressings. New onset A fib treated with IV Cardizem. Follow up CCT with evolution and worsening of posterior mass effect with increasing hydro and no hemorrhagic conversion. Patient stable neurologically and being monitored in ICU. Stroke felt to be cardioembolic. She has had increase in wheezing with BLE edema and was treated with IV diuresis last night and this am. She continues to have ataxic gait and CIR recommended by rehab team. Patient will be admitted today.     Total: 0=NIH  Past Medical History  Past Medical History  Diagnosis Date  . Anxiety   . Depression   . Hypertension   . Hyperlipidemia   . Hx of abnormal Pap smear     Family History  family history includes Heart attack in her father; Heart disease in her mother.  Prior Rehab/Hospitalizations:  None   Current Medications  Current facility-administered medications: stroke: mapping our early stages of recovery book, , Does not apply, Once, Garvin Fila, MD;  acetaminophen (TYLENOL) tablet 650 mg, 650 mg, Oral, Q6H PRN, Leotis Pain, MD;  aspirin tablet 325 mg, 325 mg, Oral, Daily, Velvet Bathe, MD, 325 mg at 02/06/14 1010;  atorvastatin (LIPITOR) tablet 10 mg, 10 mg, Oral, q1800,  Garvin Fila, MD, 10 mg at 02/05/14 1758 bisoprolol-hydrochlorothiazide (ZIAC) 10-6.25 MG per tablet 1 tablet, 1 tablet, Oral, Daily, Donzetta Starch, NP, 1 tablet at 02/06/14 1009;  clorazepate (TRANXENE) tablet 3.75 mg, 3.75 mg, Oral, QHS PRN, Debbe Odea, MD, 3.75 mg at 02/05/14 2121;  cycloSPORINE (RESTASIS) 0.05 % ophthalmic emulsion 1 drop, 1 drop, Both Eyes, BID, Velvet Bathe, MD, 1 drop at 02/03/14 2115 diltiazem (CARDIZEM CD) 24 hr capsule 120 mg, 120 mg, Oral, Daily, Debbe Odea, MD;  guaiFENesin (MUCINEX) 12 hr tablet 600 mg, 600 mg, Oral, BID, Cherene Altes, MD, 600 mg at 02/06/14 1010;  heparin injection 5,000 Units, 5,000 Units, Subcutaneous, 3 times per day, Velvet Bathe, MD, 5,000 Units at 02/06/14  0547;  labetalol (NORMODYNE,TRANDATE) injection 10 mg, 10 mg, Intravenous, Q2H PRN, Roland Rack, MD, 10 mg at 02/05/14 2343 multivitamin with minerals tablet 1 tablet, 1 tablet, Oral, Daily, Velvet Bathe, MD, 1 tablet at 02/06/14 1010;  ondansetron (ZOFRAN) injection 4 mg, 4 mg, Intravenous, Q6H PRN, Wallie Char, 4 mg at 02/03/14 9371;  senna-docusate (Senokot-S) tablet 1 tablet, 1 tablet, Oral, QHS PRN, Velvet Bathe, MD, 1 tablet at 02/05/14 2121;  sodium chloride (hypertonic) 3 % solution, , Intravenous, Continuous, Garvin Fila, MD, 19 mL/hr at 02/05/14 2339  Patients Current Diet: Cardiac  Precautions / Restrictions Precautions Precautions: Fall Precaution Comments: Cerebellar stroke - ataxia Restrictions Weight Bearing Restrictions: No   Prior Activity Level Community (5-7x/wk): Went out 3-4 X a week  Development worker, international aid / Paramedic Devices/Equipment: None  Prior Functional Level Prior Function Level of Independence: Independent Comments: son present during session and is a Investment banker, corporate   Current Functional Level Cognition  Arousal/Alertness: Awake/alert (drowsy) Overall Cognitive Status: Impaired/Different from baseline Orientation Level: Oriented X4 Safety/Judgement: Decreased awareness of safety Attention: Sustained Sustained Attention: Appears intact Memory:  (to assess further) Awareness: Appears intact Problem Solving: Impaired Problem Solving Impairment: Verbal basic Safety/Judgment: Appears intact    Extremity Assessment (includes Sensation/Coordination)  Upper Extremity Assessment: Defer to OT evaluation Lower Extremity Assessment: LLE deficits/detail  LLE Deficits / Details: Lt LE decreased coordination with ataxia  Cervical / Trunk Assessment: Other exceptions (trunk ataxia )     ADLs  Overall ADL's : Needs assistance/impaired Eating/Feeding: Set up;Sitting Grooming: Supervision/safety;Sitting;Brushing hair;Wash/dry  face;Wash/dry hands Upper Body Bathing: Moderate assistance;Sitting Lower Body Bathing: Moderate assistance;Sit to/from stand Upper Body Dressing : Moderate assistance;Sitting Lower Body Dressing: Moderate assistance Toilet Transfer: Minimal assistance;Stand-pivot;BSC Toilet Transfer Details (indicate cue type and reason): pt kept close to therapists body to increase trunk support due to ataxia Toileting - Clothing Manipulation Details (indicate cue type and reason): Max Functional mobility during ADLs: Minimal assistance (for stand pivot transfer to bed) General ADL Comments: Pt sitting up in chair fatigued.  Pt able to don/doff socks with DOE 3/4.  Required sitting rest break.      Mobility  Overal bed mobility: Needs Assistance Bed Mobility: Supine to Sit Supine to sit: Min assist Sit to supine: Min assist General bed mobility comments: Min assist for safety    Transfers  Overall transfer level: Needs assistance Equipment used: Rolling walker (2 wheeled) Transfers: Sit to/from Stand Sit to Stand: Min assist Stand pivot transfers: Min assist General transfer comment: Verbal cues for hand placement and safety.  Assist for balance/safety    Ambulation / Gait / Stairs / Wheelchair Mobility  Ambulation/Gait Ambulation/Gait assistance: Min assist;+2 safety/equipment (+1 chair) Ambulation Distance (Feet): 12 Feet Assistive device: Rolling walker (2  wheeled) Gait Pattern/deviations: Step-through pattern;Decreased step length - right;Decreased step length - left;Decreased stride length;Ataxic;Trunk flexed Gait velocity: Slow Gait velocity interpretation: Below normal speed for age/gender General Gait Details: Verbal cues and physical assist for safe use of RW.  Cues to stand upright and stay close to RW.  Patient taking short shuffling steps.  Assist for balance/safety.  During gait, patient reports blurred vision.  Improved once sitting. Notified RN.    Posture / Balance Dynamic  Sitting Balance Sitting balance - Comments: More steady in sitting today    Special needs/care consideration BiPAP/CPAP No CPM No Continuous Drip IV No Dialysis No        Life Vest No Oxygen No Special Bed No Trach Size No Wound Vac (area) No      Skin No                               Bowel mgmt: Last documented BM 01/30/14 Bladder mgmt: Has a foley catheter Diabetic mgmt No    Previous Home Environment Living Arrangements: Alone  Lives With: Alone Available Help at Discharge: Other (Comment) Home Care Services: No Additional Comments: Has DME - RW, BSC, shower chair, lift chair  Discharge Living Setting Plans for Discharge Living Setting: Patient's home;Alone;House Type of Home at Discharge: House Discharge Home Layout: One level Discharge Home Access: Stairs to enter Entrance Stairs-Number of Steps: 5-6 step back entry and 3 step front entry Does the patient have any problems obtaining your medications?: No  Social/Family/Support Systems Patient Roles: Parent (Has a son and a daughter.) Contact Information: Brayton Mars Anticipated Caregiver: self and daughter Anticipated Caregiver's Contact Information: Anderson Malta - dtr [phone] Ability/Limitations of Caregiver: Dtr to come stay with patient initially (Has 2 yr old brother and neighbors.) Caregiver Availability: 24/7 Discharge Plan Discussed with Primary Caregiver: Yes Is Caregiver In Agreement with Plan?: Yes Does Caregiver/Family have Issues with Lodging/Transportation while Pt is in Rehab?: No  Goals/Additional Needs Patient/Family Goal for Rehab: PT/OT mod I/S, ST mod I goals Expected length of stay: 10-13 days Cultural Considerations: [facility] Dietary Needs: Heart, thins Equipment Needs: TBD Pt/Family Agrees to Admission and willing to participate: Yes Program Orientation Provided & Reviewed with Pt/Caregiver Including Roles  & Responsibilities: Yes  Decrease burden of Care through IP rehab  admission: N/A  Possible need for SNF placement upon discharge: Not anticipated  Patient Condition: This patient's condition remains as documented in the consult dated 02/05/14, in which the Rehabilitation Physician determined and documented that the patient's condition is appropriate for intensive rehabilitative care in an inpatient rehabilitation facility pending need for friends or neighbors to help at the time of DC. These areas have been addressed. Daughter plans to come stay with patient for a couple weeks after discharge from rehab.  Will admit to inpatient rehab today.  Preadmission Screen Completed By:  Retta Diones, 02/06/2014 12:13 PM ______________________________________________________________________   Discussed status with Dr. Naaman Plummer on 02/06/14 at 53 and received telephone approval for admission today.  Admission Coordinator:  Retta Diones, time1230/Date04/07/15

## 2014-02-06 NOTE — Discharge Summary (Signed)
Physician Discharge Summary  Tammy Barron:[id number] DOB: 03-Jun-1941 DOA: 01/31/2014  PCP: Rogelia Boga, MD  Admit date: 01/31/2014 Discharge date: 02/06/2014  Time spent: >45 minutes  Recommendations for Outpatient Follow-up:  1. F/y BP daily and adjust meds as needed 2. Repeat head CT on Friday and call Dr Pearlean Brownie - he will decide whether to start on anticoagulation for A-fib- cont ASA for now 3. D/c to CIR 4. Neuro to advise when to start anticoagulation for A-fib  Discharge Diagnoses:  Principal Problem:   Cerebellar stroke Active Problems:   HYPERLIPIDEMIA   ANXIETY   HYPERTENSION   Obstructive hydrocephalus   Atrial fibrillation   Hypernatremia, induced   Obesity, unspecified   Tobacco use disorder   Discharge Condition: stable  Diet recommendation: heart healthy, low sodium  Filed Weights   01/31/14 1900 02/03/14 0500  Weight: 86.3 kg (190 lb 4.1 oz) 89.3 kg (196 lb 13.9 oz)    History of present illness:  73 y.o. female who presented on 01/31/2014 with a history of Anxiety; Depression; Hypertension; Hyperlipidemia, and tobacco abuse w/ c/o a few days of "dizziness" which was c/w vertigo. She was found to have a right PICA infarct with mass effect and hydrocephalus. Right PICA noted to be occluded - suspected by Neurology to be an embolic event. The pt was transferred to the ICU after admission due to increasing lethargy.      Hospital Course:  Cerebellar stroke (R PICA) with hydrocephalus and mass effect  - embolic in nature--- Neuro had plans for TEE with implantation of loop to r/o afib/arrythmia, but pt went briefly into afib during the hospital stay therefore this is no longer felt to be indicated  - 3% saline  Given due to mass effect- stopped today -cont full dose ASA per Neuro-  - Repeat head CT on Friday and call Dr Pearlean Brownie - he will decide whether to start on anticoagulation for A-fib- cont ASA for now  Newly appreciated Afib  - likey the  cause for above CVA - - TSH normal - remains in NSR at present  - systemic anticoag timing to be directed by Neurology  - has been on 30 Q 6 of Short acting Cardizem here- will switch to long acting now  Hypokalemia  - cont to replace with goal of 4.0 or > - Mg at goal   HYPERLIPIDEMIA  -cholesterol 209; LDL 126 - statin started this admission   ANXIETY  - PRN Tranxene - well managed at this time   Tobacco abuse  - prn nebs + IS + flutter valve - counseled on need to d/c tobacco   HYPERTENSION  - resumed home meds (Ziac)  Right ICA stenosis 60-79% ICA by Doppler -incidental finding. Will need outpatient monitoring at discharge  Morbid Obesity   Code Status: NO CPR/cardioversion per Dr Alene Mires discussion with pt   Consultants:  Stroke Team   Procedures:  TTE - 4/2 - EF 55% to 60% - wall motion was normal - grade 1 diastolic dysfunction - no evidence of embolic source  Carotid dopplers : 60 - 79 percent stenosisinvolving the right internal carotid artery - unable to visualize left ICA due to central line and dressing    Discharge Exam: Filed Vitals:   02/06/14 0817 02/06/14 0900 02/06/14 1000 02/06/14 1100  BP:  180/66 184/70 149/62  Pulse:  74 65 64  Temp: 97.9 F (36.6 C)     TempSrc: Oral     Resp:  18 19  15  Height:      Weight:      SpO2:  90% 93% 92%    General: no acute respiratory distress - alert and conversant  Lungs: CTA th/o w/o wheeze  Cardiovascular: regular rate and rhythm at present without murmur gallop or rub  Abdomen: nontender, nondistended, soft, bowel sounds positive, no rebound, no ascites, no appreciable mass  Extremities: no significant cyanosis, clubbing, or edema bilateral lower extremities      Discharge Instructions You were cared for by a hospitalist during your hospital stay. If you have any questions about your discharge medications or the care you received while you were in the hospital after you are discharged, you can  call the unit and asked to speak with the hospitalist on call if the hospitalist that took care of you is not available. Once you are discharged, your primary care physician will handle any further medical issues. Please note that NO REFILLS for any discharge medications will be authorized once you are discharged, as it is imperative that you return to your primary care physician (or establish a relationship with a primary care physician if you do not have one) for your aftercare needs so that they can reassess your need for medications and monitor your lab values.      Discharge Orders   Future Orders Complete By Expires   Diet - low sodium heart healthy  As directed    Increase activity slowly  As directed        Medication List         aspirin 325 MG tablet  Take 1 tablet (325 mg total) by mouth daily.     atorvastatin 10 MG tablet  Commonly known as:  LIPITOR  Take 1 tablet (10 mg total) by mouth daily at 6 PM.     bisoprolol-hydrochlorothiazide 10-6.25 MG per tablet  Commonly known as:  ZIAC  Take 1 tablet by mouth daily.     clorazepate 3.75 MG tablet  Commonly known as:  TRANXENE  Take 1 tablet (3.75 mg total) by mouth 2 (two) times daily as needed for anxiety.     cycloSPORINE 0.05 % ophthalmic emulsion  Commonly known as:  RESTASIS  Place 1 drop into both eyes 2 (two) times daily.     diltiazem 120 MG 24 hr capsule  Commonly known as:  CARDIZEM CD  Take 1 capsule (120 mg total) by mouth daily.     meloxicam 15 MG tablet  Commonly known as:  MOBIC  Take 15 mg by mouth daily.     multivitamin tablet  Take 1 tablet by mouth daily.       Allergies  Allergen Reactions  . Latex Rash      The results of significant diagnostics from this hospitalization (including imaging, microbiology, ancillary and laboratory) are listed below for reference.    Significant Diagnostic Studies: Dg Chest 2 View  02/03/2014   CLINICAL DATA:  Shortness of breath  EXAM: CHEST  2  VIEW  COMPARISON:  01/31/2014  FINDINGS: A left jugular central line is again noted in the left innominate vein. It has withdrawn slightly in the interval from the prior exam. Cardiac shadow is within normal limits. The lungs are clear bilaterally. No focal infiltrate or sizable effusion is seen. No acute bony abnormality is noted.  IMPRESSION: No acute abnormality seen.   Electronically Signed   By: Alcide Clever M.D.   On: 02/03/2014 11:54   Ct Head Wo Contrast  02/03/2014   CLINICAL DATA:  Followup  EXAM: CT HEAD WITHOUT CONTRAST  TECHNIQUE: Contiguous axial images were obtained from the base of the skull through the vertex without intravenous contrast.  COMPARISON:  Prior CT from 02/01/2014.  FINDINGS: Previous identified ischemic infarct involving the inferior medial right cerebellar hemisphere again seen. Overall size and distribution is stable. There is Stable dual slightly worsened mass effect on the adjacent fourth ventricle. Partial effacement of the pre pontine cistern again noted. Overall dilatation of the lateral and third ventricles is not significantly changed. No definite transependymal flow of CSF identified. No evidence of hemorrhagic conversion.  No midline shift. No intracranial hemorrhage. No mass lesion identified. No supratentorial midline shift. No extra-axial fluid collection.  Calvarium remains intact. Orbits are normal. Paranasal sinuses and mastoid air cells are clear.  IMPRESSION: 1. Continued interval evolution of right posterior inferior cerebral artery territory infarct with similar mass effect on the adjacent fourth ventricle with similar hydrocephalus. No hemorrhagic conversion. Effacement of the pre pontine cistern with mass effect on the basilar artery is similar. 2. No new intracranial hemorrhage or new large vessel territory infarct identified.   Electronically Signed   By: Rise Mu M.D.   On: 02/03/2014 06:57   Ct Head Wo Contrast  02/01/2014   CLINICAL DATA:   Follow-up evaluation.  EXAM: CT HEAD WITHOUT CONTRAST  TECHNIQUE: Contiguous axial images were obtained from the base of the skull through the vertex without intravenous contrast.  COMPARISON:  MR HEAD WO/W CM dated 01/31/2014; CT HEAD W/O CM dated 01/31/2014  FINDINGS: Right inferomedial cerebellar evolving hypodense infarct. Worsening edema results in effacement of the foramen of Magendie and Luschka, with severely effaced fourth ventricle. Worsening hydrocephalus, worsening hydrocephalus, anterior recess of third ventricle is 10 mm. No definite transependymal flow cerebral spinal fluid. The temporal horns are enlarging.  No intraparenchymal hemorrhage. Minimal supratentorial white matter hypodensities No supratentorial midline shift. No abnormal extra-axial fluid collections. Posterior fossa mass effect resultant effacement of the pre pontine cistern and mass effect on the basilar artery.  No paranasal sinus air-fluid levels. Mastoid air cells are well-aerated. No skull fracture.  IMPRESSION: Evolving right posterior inferior cerebellar artery territory infarct with worsening posterior fossa mass effect, increasing hydrocephalus. No hemorrhagic conversion. Effaced pre pontine cistern with mass effect on the basilar artery.  These results will be called to the ordering clinician or representative by the Radiologist Assistant, and communication documented in the PACS Dashboard.   Electronically Signed   By: Awilda Metro   On: 02/01/2014 06:09   Ct Head Wo Contrast  01/31/2014   CLINICAL DATA:  Altered mental status and blurred vision ; Nausea  EXAM: CT HEAD WITHOUT CONTRAST  TECHNIQUE: Contiguous axial images were obtained from the base of the skull through the vertex without intravenous contrast. Study was obtained within 24 hr of patient's arrival at the emergency department.  COMPARISON:  None.  FINDINGS: There is edema throughout most of the right cerebellum with mass effect on the fourth ventricle. The  fourth ventricle is partially effaced and displaced minimally toward the left. There is relative enlargement of the lateral and third ventricles. There is some increased attenuation in the periphery of the right cerebellum which potentially could represent subtle hemorrhage. No other hemorrhage seen. There is a degree of underlying mild generalized atrophy. There is no extra-axial fluid or midline shift in the supratentorial region.  Elsewhere gray-white compartments appear normal with the exception of minimal small vessel disease in the  centra semiovale bilaterally.  Bony calvarium appears intact.  The mastoid air cells are clear.  IMPRESSION: Extensive right cerebellar edema causing mass effect and effacement on the fourth ventricle and relative hydrocephalus of the lateral and third ventricles. The appearance in the right cerebellum is concerning for potential mass, although in acute infarct could present similarly. There may be some hemorrhage along the periphery of the right cerebellum in this lesion. No other hemorrhage seen. MR correlation with and without intravenous contrast material advised.  Elsewhere, there is mild small vessel disease in the centra semiovale bilaterally. The lateral and third ventricle are midline in location.  Critical Value/emergent results were called by telephone at the time of interpretation on 01/31/2014 at 1:09 PM to Dr. Linwood Dibbles , who verbally acknowledged these results.   Electronically Signed   By: Bretta Bang M.D.   On: 01/31/2014 13:10   Mr Maxine Glenn Head Wo Contrast  01/31/2014   CLINICAL DATA:  Stroke.  EXAM: MRI HEAD WITHOUT CONTRAST  MRA HEAD WITHOUT CONTRAST  TECHNIQUE: Multiplanar, multiecho pulse sequences of the brain and surrounding structures were obtained without intravenous contrast. Angiographic images of the head were obtained using MRA technique without contrast.  COMPARISON:  CT head 01/31/2014  FINDINGS: MRI HEAD FINDINGS  Acute infarct in the right  cerebellum involving the right PICA territory. This involves a large amount of the right cerebellum, essentially the entire PICA territory and also some of the AICA territory. No associated hemorrhage. There is cerebellar edema with mass-effect on the fourth ventricle and medulla. No other acute infarct.  Generalized atrophy. Negative for hemorrhage or mass. Mild chronic microvascular ischemic change in the white matter.  Postcontrast imaging reveals normal enhancement. No enhancement of the infarct. No evidence of intracranial tumor.  MRA HEAD FINDINGS  The left vertebral artery is widely patent. Left PICA is patent. Right vertebral artery is small and irregular. There is a small amount of flow in proximal right PICA which then occludes compatible with acute infarct. The basilar is patent. Superior cerebellar and posterior cerebral arteries are patent bilaterally. Mild stenosis in the right posterior cerebral artery.  The internal carotid artery is patent bilaterally. Anterior and middle cerebral arteries are patent bilaterally without stenosis.  Negative for cerebral aneurysm.  IMPRESSION: Acute right PICA infarct in the cerebellum without hemorrhage. There is edema and mass-effect on the fourth ventricle without hydrocephalus.  There is generalized atrophy.  Negative for hemorrhage or mass.  Small and irregular distal right vertebral artery with occlusion of the right PICA consistent with acute infarct.   Electronically Signed   By: Marlan Palau M.D.   On: 01/31/2014 15:51   Mr Laqueta Jean ZO Contrast  01/31/2014   CLINICAL DATA:  Stroke.  EXAM: MRI HEAD WITHOUT CONTRAST  MRA HEAD WITHOUT CONTRAST  TECHNIQUE: Multiplanar, multiecho pulse sequences of the brain and surrounding structures were obtained without intravenous contrast. Angiographic images of the head were obtained using MRA technique without contrast.  COMPARISON:  CT head 01/31/2014  FINDINGS: MRI HEAD FINDINGS  Acute infarct in the right cerebellum  involving the right PICA territory. This involves a large amount of the right cerebellum, essentially the entire PICA territory and also some of the AICA territory. No associated hemorrhage. There is cerebellar edema with mass-effect on the fourth ventricle and medulla. No other acute infarct.  Generalized atrophy. Negative for hemorrhage or mass. Mild chronic microvascular ischemic change in the white matter.  Postcontrast imaging reveals normal enhancement. No enhancement of  the infarct. No evidence of intracranial tumor.  MRA HEAD FINDINGS  The left vertebral artery is widely patent. Left PICA is patent. Right vertebral artery is small and irregular. There is a small amount of flow in proximal right PICA which then occludes compatible with acute infarct. The basilar is patent. Superior cerebellar and posterior cerebral arteries are patent bilaterally. Mild stenosis in the right posterior cerebral artery.  The internal carotid artery is patent bilaterally. Anterior and middle cerebral arteries are patent bilaterally without stenosis.  Negative for cerebral aneurysm.  IMPRESSION: Acute right PICA infarct in the cerebellum without hemorrhage. There is edema and mass-effect on the fourth ventricle without hydrocephalus.  There is generalized atrophy.  Negative for hemorrhage or mass.  Small and irregular distal right vertebral artery with occlusion of the right PICA consistent with acute infarct.   Electronically Signed   By: Marlan Palau M.D.   On: 01/31/2014 15:51   Dg Chest Port 1 View  01/31/2014   CLINICAL DATA:  Central line placement  EXAM: PORTABLE CHEST - 1 VIEW  COMPARISON:  None.  FINDINGS: Left IJ approach central venous catheter. The catheter tip projects over the central left innominate vein. Cardiac and mediastinal contours are within normal limits. Trace atherosclerotic calcification in the aorta. No pneumothorax, pleural effusion or focal airspace consolidation. Mild central bronchitic changes  and interstitial prominence. No acute osseous abnormality.  IMPRESSION: 1. The tip of the left IJ approach central venous catheter overlies the left innominate vein. 2. Aortic atherosclerosis. 3. No acute cardiopulmonary process.   Electronically Signed   By: Malachy Moan M.D.   On: 01/31/2014 22:26    Microbiology: Recent Results (from the past 240 hour(s))  MRSA PCR SCREENING     Status: None   Collection Time    01/31/14  7:00 PM      Result Value Ref Range Status   MRSA by PCR NEGATIVE  NEGATIVE Final   Comment:            The GeneXpert MRSA Assay (FDA     approved for NASAL specimens     only), is one component of a     comprehensive MRSA colonization     surveillance program. It is not     intended to diagnose MRSA     infection nor to guide or     monitor treatment for     MRSA infections.     Labs: Basic Metabolic Panel:  Recent Labs Lab 01/31/14 1112 01/31/14 2255  02/02/14 1520 02/02/14 1720 02/03/14 0500  02/04/14 0235  02/05/14 0315 02/05/14 0910 02/05/14 1500 02/05/14 2100 02/06/14 0300 02/06/14 1000  NA 136* 139  < > 143 146 147  148*  < > 149*  < > 148* 148* 147 144 140 141  K 3.4*  --   --   --  2.9* 3.4*  --  3.6*  --  3.9  --   --   --   --   --   CL 94*  --   --   --  107 111  --  114*  --  112  --   --   --   --   --   CO2 24  --   --   --  26 24  --  25  --  27  --   --   --   --   --   GLUCOSE 171*  --   --   --  101* 139*  --  109*  --  104*  --   --   --   --   --   BUN 29*  --   --   --  7 8  --  9  --  10  --   --   --   --   --   CREATININE 0.69 0.66  --   --  0.50 0.57  --  0.55  --  0.57  --   --   --   --   --   CALCIUM 9.8  --   --   --  7.9* 7.9*  --  7.8*  --  8.1*  --   --   --   --   --   MG  --   --   --   --  1.7 1.7  --  2.0  --   --   --   --   --   --   --   < > = values in this interval not displayed. Liver Function Tests:  Recent Labs Lab 01/31/14 1112  AST 20  ALT 12  ALKPHOS 52  BILITOT 0.5  PROT 7.6  ALBUMIN  3.6    Recent Labs Lab 01/31/14 1112  LIPASE 16   No results found for this basename: AMMONIA,  in the last 168 hours CBC:  Recent Labs Lab 01/31/14 1112 01/31/14 2255 02/05/14 0315  WBC 12.1* 12.2* 6.9  NEUTROABS 10.7*  --   --   HGB 15.4* 13.5 10.7*  HCT 43.4 38.4 32.7*  MCV 87.3 88.7 93.7  PLT 198 181 130*   Cardiac Enzymes: No results found for this basename: CKTOTAL, CKMB, CKMBINDEX, TROPONINI,  in the last 168 hours BNP: BNP (last 3 results) No results found for this basename: PROBNP,  in the last 8760 hours CBG: No results found for this basename: GLUCAP,  in the last 168 hours     Signed:  Calvert CantorIZWAN,Azael Ragain, MD [facility] 02/06/2014, 11:14 AM

## 2014-02-06 NOTE — Progress Notes (Signed)
Rehab admissions - I have authorization for acute inpatient rehab admission for today.  Bed available and will admit to inpatient rehab today.  Call me for questions.  #[phone]

## 2014-02-06 NOTE — Progress Notes (Signed)
Stroke Team Progress Note  HISTORY Tammy Barron is a 73 y.o. female with a history of hypertension, hyperlipidemia, and tobacco abuse who presented 01/31/2014 with 2 days of dizziness. She stated that she tried to get out of bed Tuesday morning and she had difficulty walking. She had persistent vertigo and nausea. On the day of admission it was not any better and therefore she sought further treatment in the emergency room.  She was last known well Monday 01/29/14 prior to bedtime. She was not a tPA candidate secondary to delay in arrival. She was admitted to the hospital for further evaluation and treatment.  SUBJECTIVE No family at bedside. Patient would like CL out.  OBJECTIVE Most recent Vital Signs: Filed Vitals:   02/06/14 0547 02/06/14 0600 02/06/14 0800 02/06/14 0817  BP: 177/74 186/85 170/71   Pulse:  61 73   Temp:    97.9 F (36.6 C)  TempSrc:    Oral  Resp:  17 23   Height:      Weight:      SpO2:  93% 94%    CBG (last 3)  No results found for this basename: GLUCAP,  in the last 72 hours  IV Fluid Intake:   . sodium chloride (hypertonic) Stopped (02/06/14 [id number])    MEDICATIONS  .  stroke: mapping our early stages of recovery book   Does not apply Once  . aspirin  325 mg Oral Daily  . atorvastatin  10 mg Oral q1800  . bisoprolol-hydrochlorothiazide  1 tablet Oral Daily  . cycloSPORINE  1 drop Both Eyes BID  . diltiazem  30 mg Oral 4 times per day  . guaiFENesin  600 mg Oral BID  . heparin  5,000 Units Subcutaneous 3 times per day  . multivitamin with minerals  1 tablet Oral Daily   PRN:  acetaminophen, clorazepate, labetalol, ondansetron (ZOFRAN) IV, senna-docusate  Diet:  Cardiac thin liquids Activity:  Up with assistance DVT Prophylaxis:  Subcutaneous heparin  CLINICALLY SIGNIFICANT STUDIES Basic Metabolic Panel:   Recent Labs Lab 02/03/14 0500  02/04/14 0235  02/05/14 0315  02/05/14 2100 02/06/14 0300  NA 147  148*  < > 149*  < > 148*  < > 144  140  K 3.4*  --  3.6*  --  3.9  --   --   --   CL 111  --  114*  --  112  --   --   --   CO2 24  --  25  --  27  --   --   --   GLUCOSE 139*  --  109*  --  104*  --   --   --   BUN 8  --  9  --  10  --   --   --   CREATININE 0.57  --  0.55  --  0.57  --   --   --   CALCIUM 7.9*  --  7.8*  --  8.1*  --   --   --   MG 1.7  --  2.0  --   --   --   --   --   < > = values in this interval not displayed. Liver Function Tests:   Recent Labs Lab 01/31/14 1112  AST 20  ALT 12  ALKPHOS 52  BILITOT 0.5  PROT 7.6  ALBUMIN 3.6   CBC:   Recent Labs Lab 01/31/14 1112 01/31/14 2255 02/05/14 0315  WBC  12.1* 12.2* 6.9  NEUTROABS 10.7*  --   --   HGB 15.4* 13.5 10.7*  HCT 43.4 38.4 32.7*  MCV 87.3 88.7 93.7  PLT 198 181 130*   Coagulation:   Recent Labs Lab 01/31/14 1233  LABPROT 13.3  INR 1.03   Cardiac Enzymes: No results found for this basename: CKTOTAL, CKMB, CKMBINDEX, TROPONINI,  in the last 168 hours Urinalysis:   Recent Labs Lab 01/31/14 1444 02/01/14 1405  COLORURINE YELLOW YELLOW  LABSPEC 1.022 1.021  PHURINE 5.5 6.0  GLUCOSEU NEGATIVE NEGATIVE  HGBUR NEGATIVE NEGATIVE  BILIRUBINUR NEGATIVE NEGATIVE  KETONESUR 40* NEGATIVE  PROTEINUR 30* NEGATIVE  UROBILINOGEN 0.2 0.2  NITRITE NEGATIVE NEGATIVE  LEUKOCYTESUR NEGATIVE NEGATIVE   Lipid Panel    Component Value Date/Time   CHOL 209* 02/01/2014 0450   TRIG 189* 02/01/2014 0450   HDL 45 02/01/2014 0450   CHOLHDL 4.6 02/01/2014 0450   VLDL 38 02/01/2014 0450   LDLCALC 126* 02/01/2014 0450   HgbA1C  Lab Results  Component Value Date   HGBA1C 5.3 02/01/2014    Urine Drug Screen:   No results found for this basename: labopia,  cocainscrnur,  labbenz,  amphetmu,  thcu,  labbarb    Alcohol Level: No results found for this basename: ETH,  in the last 168 hours  Ct Head Wo Contrast 02/03/2014 Continued interval evolution of right posterior inferior cerebral artery territory infarct with similar mass effect on the  adjacent fourth ventricle with similar hydrocephalus. No hemorrhagic conversion. Effacement of the pre pontine cistern with mass effect on the basilar artery is similar. 02/01/2014   Evolving right posterior inferior cerebellar artery territory infarct with worsening posterior fossa mass effect, increasing hydrocephalus. No hemorrhagic conversion. Effaced pre pontine cistern with mass effect on the basilar artery.   01/31/2014   Extensive right cerebellar edema causing mass effect and effacement on the fourth ventricle and relative hydrocephalus of the lateral and third ventricles. The appearance in the right cerebellum is concerning for potential mass, although in acute infarct could present similarly. There may be some hemorrhage along the periphery of the right cerebellum in this lesion. No other hemorrhage seen. MR correlation with and without intravenous contrast material advised.  Elsewhere, there is mild small vessel disease in the centra semiovale bilaterally. The lateral and third ventricle are midline in location.   Mri Head Wo Contrast 01/31/2014    Acute right PICA infarct in the cerebellum without hemorrhage. There is edema and mass-effect on the fourth ventricle without hydrocephalus.  There is generalized atrophy.  Negative for hemorrhage or mass.     Mra Brain W Wo Contrast 01/31/2014    Small and irregular distal right vertebral artery with occlusion of the right PICA consistent with acute infarct.      Dg Chest Port 1 View 01/31/2014    1. The tip of the left IJ approach central venous catheter overlies the left innominate vein. 2. Aortic atherosclerosis. 3. No acute cardiopulmonary process.     2D Echocardiogram  - ejection fraction 55-60%. Grade 1 diastolic dysfunction. No cardiac source of emboli identified.  Carotid Doppler  - Preliminary report: Right: 60-79% ICA stenosis. Left: Unable to visualize due to central line/dressings. Right vertebral retrograde. Left vertebral  antegrade.   EKG sinus rhythm rate 83 beats per minute. For complete results please see formal report.   Therapy Recommendations - inpatient rehabilitation   Physical Exam   Mental Status:  Patient is awake and alert. She follows all commands.  No signs of aphasia or neglect  Cranial Nerves:  II: Visual Fields are full. Pupils are equal, round, and reactive to light. Discs are difficult to visualize.  III,IV, VI: EOMI without ptosis or diploplia.  V: Facial sensation is symmetric to temperature  VII: Facial movement is symmetric.  VIII: hearing is intact to voice  X: Uvula elevates symmetrically  XI: Shoulder shrug is symmetric.  XII: tongue is midline without atrophy or fasciculations.  Motor:  Tone is normal. Bulk is normal. 5/5 strength was present in all four extremities.  Sensory:  Sensation is symmetric to light touch and temperature in the arms and legs.  Deep Tendon Reflexes:  2+ and symmetric in the biceps and patellae.  Plantars:  Toes are downgoing bilaterally.  Cerebellar:  FNF and HKS are intact on left, dysmetria present on right.  Gait:  Not tested.   ASSESSMENT Tammy Barron is a 73 y.o. female presenting with dizziness. Patient with an acute right PICA infarct in the cerebellum without hemorrhage.  Imaging confirms a right PICA infarct with cerebral cytotoxic edema and mass effect on the 4th ventricle. Increasing hydrocephalus the next a.m. Placed on 3% saline to help manage cerebral edema.  Infarct found to be embolic secondary to new onset atrial fibrillation in the hospital. On no antithrombotic prior to admission. Now on aspirin 325 mg orally every day for secondary stroke prevention. Patient with resultant continued dizziness, headache. Stroke work up completed.   Induced hypernatremia  3% protocol started to decrease cerebral edema, drip decreased to cessation starting 4/6  Na 140, returned to normal levels  Right ICA stenosis 60-79% ICA by  Doppler, incidental finding. Will need outpatient monitoring at discharge.  Fluid overload  Wheezing, Cough improved.  Hypokalemia, resolved   atrial fibrillation, new dx  Back in NSR w/ cardizem drip  Hypertension history  Hyperlipidemia - cholesterol 209; LDL 126  Now on Lipitor 10 mg daily, Simvastatin changed to atorvastatin per policy due to interaction with diltiazem gtt.    Hemoglobin A1c 5.3  Tobacco abuse  Obesity, Body mass index is 33.28 kg/(m^2).   Hospital day # 6  TREATMENT/PLAN  Continue aspirin 325 mg orally every day for secondary stroke prevention. Consider anticoagulation for atrial fibrillation, timing pending pt progress - would check CT head without contrast on Friday, call stroke team with results. If size of stroke decreasing, ok to start anticoagulation. Recommend NOAC.  Discontinue frequent Na checks  Ok to transfer to floor from neuro standpoint  D/c central line  Inpatient rehabilitation planned for discharge  Annie Main, MSN, RN, ANVP-BC, ANP-BC, [facility] Pager: [phone] 02/06/2014 9:25 AM   I have personally obtained a history, examined the patient, evaluated imaging results, and formulated the assessment and plan of care. I agree with the above. Delia Heady, MD  To contact Stroke Continuity provider, please refer to [email or web address]. After hours, contact General Neurology

## 2014-02-06 NOTE — H&P (Signed)
Physical Medicine and Rehabilitation Admission H&P  Chief Complaint   Patient presents with   .  Impaired balance, dizziness, difficulty walking.   HPI: JERRIAH INES is a 73 y.o. female with history of HTN, anxiety/depression, tobacco abuse who was admitted on 01/31/14 with two day history of dizziness, N &V progressing to difficulty walking. CT head with extensive right cerebellar edema causing mass effect and effacement on the fourth ventricle and relative hydrocephalus of the lateral and third ventricles and concern of potential mass right cerebellum v/s acute infarct. MRI/MRA brain with acute R-PICA infarct in cerebellum with edema and mass effect on 4th ventricle without hydro and occlusion of R-PICA c/w acute infarct. She was started on hypertonic saline for dema control and ASA 325 mg for secondary stroke prevention. 2D echo with EF 55-65% with no wall abnormalities and grade 1 diastolic dysfunction. Carotid dopplers with 60-79% R- ICA stenosis, R-VA retrograde flow and inability to visualize L-ICA due to dressings. New onset A fib treated with IV Cardizem. Follow up CCT with evolution and worsening of posterior mass effect with increasing hydro and no hemorrhagic conversion. Patient stable neurologically and being monitored in ICU. Stroke felt to be cardioembolic. She has had increase in wheezing with BLE edema and was treated with IV diuresis last night and this am. She continues to have ataxic gait and CIR recommended by rehab team. Patient admitted today.    Review of Systems  HENT: Negative for hearing loss.  Eyes: Negative for blurred vision and double vision.  Respiratory: Positive for shortness of breath and wheezing. Negative for cough.  Cardiovascular: Positive for leg swelling. Negative for chest pain and palpitations.  Gastrointestinal: Positive for constipation. Negative for heartburn, nausea and vomiting.  Musculoskeletal: Positive for myalgias. Negative for back pain.    Neurological: Negative for dizziness, speech change and headaches.   Past Medical History   Diagnosis  Date   .  Anxiety    .  Depression    .  Hypertension    .  Hyperlipidemia    .  Hx of abnormal Pap smear     Past Surgical History   Procedure  Laterality  Date   .  Abdominal hysterectomy   1982    Family History   Problem  Relation  Age of Onset   .  Heart disease  Mother    .  Heart attack  Father     Social History: Lives alone. Retired Marine scientist. She reports that she has been smoking about 1.00 pack per day. She has never used smokeless tobacco. She reports that she does not drink alcohol. Her drug history is not on file.  Allergies   Allergen  Reactions   .  Latex  Rash    Medications Prior to Admission   Medication  Sig  Dispense  Refill   .  bisoprolol-hydrochlorothiazide (ZIAC) 10-6.25 MG per tablet  Take 1 tablet by mouth daily.     .  clorazepate (TRANXENE) 3.75 MG tablet  Take 1 tablet (3.75 mg total) by mouth 2 (two) times daily as needed for anxiety.  30 tablet  5   .  cycloSPORINE (RESTASIS) 0.05 % ophthalmic emulsion  Place 1 drop into both eyes 2 (two) times daily.     .  meloxicam (MOBIC) 15 MG tablet  Take 15 mg by mouth daily.     .  Multiple Vitamin (MULTIVITAMIN) tablet  Take 1 tablet by mouth daily.      Home:  Home Living  Family/patient expects to be discharged to:: Inpatient rehab  Living Arrangements: Alone  Available Help at Discharge: Other (Comment)  Additional Comments: Has DME - RW, BSC, shower chair, lift chair  Lives With: Alone  Functional History:  Prior Function  Vocation: Retired (retired Therapist, sports)  Comments: son present during session and is a Agricultural engineer Status:  Mobility: min assist for basic mobility, transfers   Ambulation/Gait  Ambulation Distance (Feet): 12 Feet  Gait velocity: Slow  General Gait Details: Verbal cues and physical assist for safe use of RW. Cues to stand upright and stay close to RW. Patient taking short  shuffling steps. Assist for balance/safety. During gait, patient reports blurred vision. Improved once sitting. Notified RN.   ADL: mod assist ADL  Toilet Transfer Details (indicate cue type and reason): pt kept close to therapists body to increase trunk support due to ataxia  Toileting - Clothing Manipulation Details (indicate cue type and reason): Max  Cognition:  Cognition  Overall Cognitive Status: Impaired/Different from baseline  Arousal/Alertness: Awake/alert (drowsy)  Orientation Level: Oriented X4  Attention: Sustained  Sustained Attention: Appears intact  Memory: (to assess further)  Awareness: Appears intact  Problem Solving: Impaired  Problem Solving Impairment: Verbal basic  Safety/Judgment: Appears intact  Cognition  Arousal/Alertness: Awake/alert  Behavior During Therapy: Impulsive  Overall Cognitive Status: Impaired/Different from baseline  Area of Impairment: Safety/judgement;Problem solving  Orientation Level: Disoriented to;Time  Safety/Judgement: Decreased awareness of safety  Problem Solving: Slow processing;Requires verbal cues;Requires tactile cues    Physical Exam:  Blood pressure 170/71, pulse 73, temperature 97.9 F (36.6 C), temperature source Oral, resp. rate 23, height 5' 4.5" (1.638 m), weight 89.3 kg (196 lb 13.9 oz), SpO2 94.00%.   Constitutional: She appears well-developed and well-nourished.  HENT: oral mucosa pink and moist. Wears dentures Head: Normocephalic and atraumatic.  Eyes: Conjunctivae and EOM are normal. Pupils are equal, round, and reactive to light.  Neck: No JVD present. No tracheal deviation present.  Cardiovascular: Normal rate. Rhythm still irregular, no murmurs Respiratory: Effort normal. No wheezes or rales GI: She exhibits no distension.  Neurological: CN exam notable for no focal abnormalities. Had some difficulty tracking throughout all visual fields although frank nystagmus was appreciated. Denies diplopia although  things feel "just off"--depth perception slightly impaired.  Reasonable fine motor control of bilateral upper ext and le although there are mild impairments of coordination of both the right arm and leg. Speech generally clear. Thought content a bit disjointed and processing sometimes delayed. Language overall appears normal. Strength functional at 4 to 4+/5 in all 4 limbs prox to distal. No sensory deficits, DTR's 1+ Psychiatric: She has a normal mood and affect. Her behavior is normal.      Results for orders placed during the hospital encounter of 01/31/14 (from the past 48 hour(s))   SODIUM Status: Abnormal    Collection Time    02/04/14 3:17 PM   Result  Value  Ref Range    Sodium  148 (*)  137 - 147 mEq/L   SODIUM Status: None    Collection Time    02/04/14 8:58 PM   Result  Value  Ref Range    Sodium  146  137 - 147 mEq/L   BASIC METABOLIC PANEL Status: Abnormal    Collection Time    02/05/14 3:15 AM   Result  Value  Ref Range    Sodium  148 (*)  137 - 147 mEq/L    Potassium  3.9  3.7 - 5.3 mEq/L    Chloride  112  96 - 112 mEq/L    CO2  27  19 - 32 mEq/L    Glucose, Bld  104 (*)  70 - 99 mg/dL    BUN  10  6 - 23 mg/dL    Creatinine, Ser  0.57  0.50 - 1.10 mg/dL    Calcium  8.1 (*)  8.4 - 10.5 mg/dL    GFR calc non Af Amer  89 (*)  >90 mL/min    GFR calc Af Amer  >90  >90 mL/min    Comment:  (NOTE)     The eGFR has been calculated using the CKD EPI equation.     This calculation has not been validated in all clinical situations.     eGFR's persistently <90 mL/min signify possible Chronic Kidney     Disease.   CBC Status: Abnormal    Collection Time    02/05/14 3:15 AM   Result  Value  Ref Range    WBC  6.9  4.0 - 10.5 K/uL    RBC  3.49 (*)  3.87 - 5.11 MIL/uL    Hemoglobin  10.7 (*)  12.0 - 15.0 g/dL    HCT  32.7 (*)  36.0 - 46.0 %    MCV  93.7  78.0 - 100.0 fL    MCH  30.7  26.0 - 34.0 pg    MCHC  32.7  30.0 - 36.0 g/dL    RDW  13.5  11.5 - 15.5 %    Platelets   130 (*)  150 - 400 K/uL   SODIUM Status: Abnormal    Collection Time    02/05/14 9:10 AM   Result  Value  Ref Range    Sodium  148 (*)  137 - 147 mEq/L   SODIUM Status: None    Collection Time    02/05/14 3:00 PM   Result  Value  Ref Range    Sodium  147  137 - 147 mEq/L   SODIUM Status: None    Collection Time    02/05/14 9:00 PM   Result  Value  Ref Range    Sodium  144  137 - 147 mEq/L   SODIUM Status: None    Collection Time    02/06/14 3:00 AM   Result  Value  Ref Range    Sodium  140  137 - 147 mEq/L    No results found.  Post Admission Physician Evaluation:  1. Functional deficits secondary to right PICA infarct. 2. Patient is admitted to receive collaborative, interdisciplinary care between the physiatrist, rehab nursing staff, and therapy team. 3. Patient's level of medical complexity and substantial therapy needs in context of that medical necessity cannot be provided at a lesser intensity of care such as a SNF. 4. Patient has experienced substantial functional loss from his/her baseline which was documented above under the "Functional History" and "Functional Status" headings. Judging by the patient's diagnosis, physical exam, and functional history, the patient has potential for functional progress which will result in measurable gains while on inpatient rehab. These gains will be of substantial and practical use upon discharge in facilitating mobility and self-care at the household level. 5. Physiatrist will provide 24 hour management of medical needs as well as oversight of the therapy plan/treatment and provide guidance as appropriate regarding the interaction of the two. 6. 24 hour rehab nursing will assist with bladder management, bowel management, safety,  skin/wound care, disease management, medication administration, pain management and patient education and help integrate therapy concepts, techniques,education, etc. 7. PT will assess and treat for/with: Lower  extremity strength, range of motion, stamina, balance, functional mobility, safety, adaptive techniques and equipment, NMR, vestibular assessment and rx. Goals are: mod I. 8. OT will assess and treat for/with: ADL's, functional mobility, safety, upper extremity strength, adaptive techniques and equipment, vestibular rx, NMR. Goals are: mod I. 9. SLP will assess and treat for/with: speech, cognition. Goals are: mod I. 10. Case Management and Social Worker will assess and treat for psychological issues and discharge planning. 11. Team conference will be held weekly to assess progress toward goals and to determine barriers to discharge. 12. Patient will receive at least 3 hours of therapy per day at least 5 days per week. 13. ELOS: 10-13 days  14. Prognosis: excellent   Medical Problem List and Plan:  Right PICA infarct  1. DVT Prophylaxis/Anticoagulation: Pharmaceutical: Lovenox  2. Pain Management: Prn tylenol  3. Mood: Provide ego support. LCSW to follow for evaluation and support.  4. Neuropsych: This patient is capable of making decisions on her own behalf.  5. Fluid overload: Has had 1000cc of UOP this am. Will leave foley in to monitor diuresis and reassess for continued need for foley in the morning.  6. Induced Hypernatremia: Will recheck labs in am.  7. Wheezing: likely has component of hyperactive airway disease v/s COPD. Will add nebs prn to help with symptoms. Patient instructed to use flutter valve every 2 hours while awake.  8. HTN: Monitor with bid checks. Continue bisoprolol/HCTZ. Cardizem added today.  9. ABLA: recheck CBC in am. Will order stool guaiacs.  10. Constipation: Reports no BM since admission--will set bowel program.   Meredith Staggers, MD, [facility]   02/06/2014

## 2014-02-07 ENCOUNTER — Inpatient Hospital Stay (HOSPITAL_COMMUNITY): Payer: Medicare Other

## 2014-02-07 ENCOUNTER — Inpatient Hospital Stay (HOSPITAL_COMMUNITY): Payer: Medicare Other | Admitting: Physical Therapy

## 2014-02-07 ENCOUNTER — Inpatient Hospital Stay (HOSPITAL_COMMUNITY): Payer: Medicare Other | Admitting: Occupational Therapy

## 2014-02-07 DIAGNOSIS — I69993 Ataxia following unspecified cerebrovascular disease: Secondary | ICD-10-CM

## 2014-02-07 DIAGNOSIS — I6350 Cerebral infarction due to unspecified occlusion or stenosis of unspecified cerebral artery: Secondary | ICD-10-CM

## 2014-02-07 LAB — CBC WITH DIFFERENTIAL/PLATELET
BASOS ABS: 0 10*3/uL (ref 0.0–0.1)
BASOS PCT: 0 % (ref 0–1)
Eosinophils Absolute: 0.1 10*3/uL (ref 0.0–0.7)
Eosinophils Relative: 1 % (ref 0–5)
HCT: 37.3 % (ref 36.0–46.0)
Hemoglobin: 12.6 g/dL (ref 12.0–15.0)
LYMPHS PCT: 19 % (ref 12–46)
Lymphs Abs: 1.6 10*3/uL (ref 0.7–4.0)
MCH: 31 pg (ref 26.0–34.0)
MCHC: 33.8 g/dL (ref 30.0–36.0)
MCV: 91.6 fL (ref 78.0–100.0)
Monocytes Absolute: 0.8 10*3/uL (ref 0.1–1.0)
Monocytes Relative: 9 % (ref 3–12)
NEUTROS ABS: 6 10*3/uL (ref 1.7–7.7)
NEUTROS PCT: 71 % (ref 43–77)
PLATELETS: 174 10*3/uL (ref 150–400)
RBC: 4.07 MIL/uL (ref 3.87–5.11)
RDW: 13.5 % (ref 11.5–15.5)
WBC: 8.5 10*3/uL (ref 4.0–10.5)

## 2014-02-07 LAB — COMPREHENSIVE METABOLIC PANEL
ALT: 30 U/L (ref 0–35)
AST: 25 U/L (ref 0–37)
Albumin: 2.8 g/dL — ABNORMAL LOW (ref 3.5–5.2)
Alkaline Phosphatase: 49 U/L (ref 39–117)
BILIRUBIN TOTAL: 0.3 mg/dL (ref 0.3–1.2)
BUN: 10 mg/dL (ref 6–23)
CO2: 32 mEq/L (ref 19–32)
Calcium: 9.1 mg/dL (ref 8.4–10.5)
Chloride: 99 mEq/L (ref 96–112)
Creatinine, Ser: 0.69 mg/dL (ref 0.50–1.10)
GFR calc Af Amer: >90 mL/min (ref >90)
GFR calc non Af Amer: 84 mL/min — ABNORMAL LOW (ref >90)
Glucose, Bld: 101 mg/dL — ABNORMAL HIGH (ref 70–99)
Potassium: 3.3 mEq/L — ABNORMAL LOW (ref 3.7–5.3)
SODIUM: 143 meq/L (ref 137–147)
Total Protein: 6.2 g/dL (ref 6.0–8.3)

## 2014-02-07 MED ORDER — POTASSIUM CHLORIDE CRYS ER 10 MEQ PO TBCR
10.0000 meq | EXTENDED_RELEASE_TABLET | Freq: Every day | ORAL | Status: DC
  Filled 2014-02-07 (×2): qty 1

## 2014-02-07 MED ORDER — POTASSIUM CHLORIDE CRYS ER 10 MEQ PO TBCR
10.0000 meq | EXTENDED_RELEASE_TABLET | Freq: Two times a day (BID) | ORAL | Status: DC
  Administered 2014-02-07 – 2014-02-16 (×18): 10 meq via ORAL
  Filled 2014-02-07 (×20): qty 1

## 2014-02-07 NOTE — Progress Notes (Signed)
Subjective/Complaints: 73 y.o. female with history of HTN, anxiety/depression, tobacco abuse who was admitted on 01/31/14 with two day history of dizziness, N &V progressing to difficulty walking. CT head with extensive right cerebellar edema causing mass effect and effacement on the fourth ventricle and relative hydrocephalus of the lateral and third ventricles and concern of potential mass right cerebellum v/s acute infarct. MRI/MRA brain with acute R-PICA infarct in cerebellum with edema and mass effect on 4th ventricle without hydro and occlusion of R-PICA c/w acute infarct. She was started on hypertonic saline for dema control and ASA 325 mg for secondary stroke prevention. 2D echo with EF 55-65% with no wall abnormalities and grade 1 diastolic dysfunction. Carotid dopplers with 60-79% R- ICA stenosis, R-VA retrograde flow and inability to visualize L-ICA due to dressings. New onset A fib treated with IV Cardizem  Review of Systems - Negative except poor sleep  Objective: Vital Signs: Blood pressure 148/76, pulse 71, temperature 98.5 F (36.9 C), temperature source Oral, resp. rate 20, weight 89.2 kg (196 lb 10.4 oz), SpO2 93.00%. Dg Chest 2 View  02/06/2014   CLINICAL DATA:  Wheezing.  EXAM: CHEST  2 VIEW  COMPARISON:  DG CHEST 2 VIEW dated 02/03/2014  FINDINGS: Mediastinum and hilar structures are normal. Mild atelectasis versus infiltrate right lung base cannot be excluded. Small right pleural effusion cannot be excluded. Stable elevation of left hemidiaphragm. Stable cardiomegaly, normal pulmonary vascularity. No acute osseous abnormality.  IMPRESSION: 1. Mild infiltrate and/or atelectasis right lung base cannot be excluded. Associated small right pleural effusion cannot be excluded. 2. Stable cardiomegaly.  No CHF. 3. Stable elevation left hemidiaphragm .   Electronically Signed   By: Marcello Moores  Register   On: 02/06/2014 17:23   Results for orders placed during the hospital encounter of 02/06/14  (from the past 72 hour(s))  URINALYSIS, ROUTINE W REFLEX MICROSCOPIC     Status: Abnormal   Collection Time    02/06/14  6:37 PM      Result Value Ref Range   Color, Urine YELLOW  YELLOW   APPearance CLEAR  CLEAR   Specific Gravity, Urine 1.011  1.005 - 1.030   pH 7.5  5.0 - 8.0   Glucose, UA NEGATIVE  NEGATIVE mg/dL   Hgb urine dipstick SMALL (*) NEGATIVE   Bilirubin Urine NEGATIVE  NEGATIVE   Ketones, ur NEGATIVE  NEGATIVE mg/dL   Protein, ur NEGATIVE  NEGATIVE mg/dL   Urobilinogen, UA 0.2  0.0 - 1.0 mg/dL   Nitrite NEGATIVE  NEGATIVE   Leukocytes, UA TRACE (*) NEGATIVE  URINE MICROSCOPIC-ADD ON     Status: Abnormal   Collection Time    02/06/14  6:37 PM      Result Value Ref Range   Squamous Epithelial / LPF FEW (*) RARE   WBC, UA 3-6  <3 WBC/hpf   RBC / HPF 3-6  <3 RBC/hpf   Bacteria, UA FEW (*) RARE  BASIC METABOLIC PANEL     Status: Abnormal   Collection Time    02/06/14  6:56 PM      Result Value Ref Range   Sodium 142  137 - 147 mEq/L   Potassium 3.4 (*) 3.7 - 5.3 mEq/L   Chloride 99  96 - 112 mEq/L   CO2 30  19 - 32 mEq/L   Glucose, Bld 111 (*) 70 - 99 mg/dL   BUN 10  6 - 23 mg/dL   Creatinine, Ser 0.70  0.50 - 1.10 mg/dL  Calcium 9.2  8.4 - 10.5 mg/dL   GFR calc non Af Amer 83 (*) >90 mL/min   GFR calc Af Amer >90  >90 mL/min   Comment: (NOTE)     The eGFR has been calculated using the CKD EPI equation.     This calculation has not been validated in all clinical situations.     eGFR's persistently <90 mL/min signify possible Chronic Kidney     Disease.  CBC WITH DIFFERENTIAL     Status: None   Collection Time    02/07/14  6:40 AM      Result Value Ref Range   WBC 8.5  4.0 - 10.5 K/uL   RBC 4.07  3.87 - 5.11 MIL/uL   Hemoglobin 12.6  12.0 - 15.0 g/dL   HCT 37.3  36.0 - 46.0 %   MCV 91.6  78.0 - 100.0 fL   MCH 31.0  26.0 - 34.0 pg   MCHC 33.8  30.0 - 36.0 g/dL   RDW 13.5  11.5 - 15.5 %   Platelets 174  150 - 400 K/uL   Neutrophils Relative % 71  43 -  77 %   Neutro Abs 6.0  1.7 - 7.7 K/uL   Lymphocytes Relative 19  12 - 46 %   Lymphs Abs 1.6  0.7 - 4.0 K/uL   Monocytes Relative 9  3 - 12 %   Monocytes Absolute 0.8  0.1 - 1.0 K/uL   Eosinophils Relative 1  0 - 5 %   Eosinophils Absolute 0.1  0.0 - 0.7 K/uL   Basophils Relative 0  0 - 1 %   Basophils Absolute 0.0  0.0 - 0.1 K/uL  COMPREHENSIVE METABOLIC PANEL     Status: Abnormal   Collection Time    02/07/14  6:40 AM      Result Value Ref Range   Sodium 143  137 - 147 mEq/L   Potassium 3.3 (*) 3.7 - 5.3 mEq/L   Chloride 99  96 - 112 mEq/L   CO2 32  19 - 32 mEq/L   Glucose, Bld 101 (*) 70 - 99 mg/dL   BUN 10  6 - 23 mg/dL   Creatinine, Ser 0.69  0.50 - 1.10 mg/dL   Calcium 9.1  8.4 - 10.5 mg/dL   Total Protein 6.2  6.0 - 8.3 g/dL   Albumin 2.8 (*) 3.5 - 5.2 g/dL   AST 25  0 - 37 U/L   ALT 30  0 - 35 U/L   Alkaline Phosphatase 49  39 - 117 U/L   Total Bilirubin 0.3  0.3 - 1.2 mg/dL   GFR calc non Af Amer 84 (*) >90 mL/min   GFR calc Af Amer >90  >90 mL/min   Comment: (NOTE)     The eGFR has been calculated using the CKD EPI equation.     This calculation has not been validated in all clinical situations.     eGFR's persistently <90 mL/min signify possible Chronic Kidney     Disease.     HEENT: normal Cardio: RRR and no murmur Resp: CTA B/L and unlabored GI: BS positive and NT,ND Extremity:  Pulses positive and No Edema Skin:   Intact Neuro: Alert/Oriented, Cranial Nerve II-XII normal, Normal Motor and Abnormal FMC Ataxic/ dec FMC Musc/Skel:  Normal GEN NAD   Assessment/Plan: 1. Functional deficits secondary to Right PICA cardioembolic infarct which require 3+ hours per day of interdisciplinary therapy in a comprehensive inpatient rehab setting. Physiatrist  is providing close team supervision and 24 hour management of active medical problems listed below. Physiatrist and rehab team continue to assess barriers to discharge/monitor patient progress toward functional  and medical goals. FIM:       FIM - Toileting Toileting Assistive Devices: Grab bar or rail for support;Toilet Aid/prosthesis/orthosis Toileting: 3: Mod-Patient completed 2 of 3 steps  FIM - Radio producer Devices: Recruitment consultant Transfers: 4-To toilet/BSC: Min A (steadying Pt. > 75%)        Comprehension Comprehension Mode: Auditory Comprehension: 5-Follows basic conversation/direction: With no assist  Expression Expression Mode: Verbal Expression: 5-Expresses basic needs/ideas: With no assist  Social Interaction Social Interaction: 5-Interacts appropriately 90% of the time - Needs monitoring or encouragement for participation or interaction.  Problem Solving Problem Solving: 5-Solves basic 90% of the time/requires cueing < 10% of the time  Memory Memory: 4-Recognizes or recalls 75 - 89% of the time/requires cueing 10 - 24% of the time  Medical Problem List and Plan:  Right PICA infarct  1. DVT Prophylaxis/Anticoagulation: Pharmaceutical: Lovenox  2. Pain Management: Prn tylenol  3. Mood: Provide ego support. LCSW to follow for evaluation and support.  4. Neuropsych: This patient is capable of making decisions on her own behalf.  5. Fluid overload: Has had 1000cc of UOP this am. Will leave foley in to monitor diuresis and reassess for continued need for foley in the morning.  6. Induced Hypernatremia: Will recheck labs in am.  7. Wheezing: likely has component of hyperactive airway disease v/s COPD. Will add nebs prn to help with symptoms. Patient instructed to use flutter valve every 2 hours while awake.  8. HTN: Monitor with bid checks. Continue bisoprolol/HCTZ. Cardizem added today.  9. ABLA: recheck CBC in am. Will order stool guaiacs.  10. Constipation: Reports no BM since admission--will set bowel program.    LOS (Days) 1 A FACE TO FACE EVALUATION WAS PERFORMED  Charlett Blake 02/07/2014, 10:14 AM

## 2014-02-07 NOTE — Progress Notes (Signed)
Social Work Patient ID: [name], female   DOB: [birth year], 73 y.o.   MRN: [id number]  CSW attempted to see pt to introduce self and complete assessment, but pt was sleeping and when CSW woke her she asked for CSW to visit later and returned to sleep.  CSW will f/u with pt and family tomorrow.

## 2014-02-07 NOTE — Progress Notes (Signed)
Patient information reviewed and entered into eRehab system by Iridiana Fonner, RN, CRRN, PPS Coordinator.  Information including medical coding and functional independence measure will be reviewed and updated through discharge.     Per nursing patient was given "Data Collection Information Summary for Patients in Inpatient Rehabilitation Facilities with attached "Privacy Act Statement-Health Care Records" upon admission.  

## 2014-02-07 NOTE — Evaluation (Signed)
Speech Language Pathology Assessment and Plan  Patient Details  Name: Tammy Barron MRN: [id number] Date of Birth: [birth year]  SLP Diagnosis: Cognitive Impairments  Rehab Potential: Excellent ELOS: 2 weeks   Today's Date: 02/07/2014 Time: 0800-0900 Time Calculation (min): 60 min  Problem List:  Patient Active Problem List   Diagnosis Date Noted  . CVA (cerebral infarction) 02/06/2014  . Thrombocytopenia, unspecified 02/06/2014  . Atrial fibrillation 02/05/2014  . Hypernatremia, induced 02/05/2014  . Obesity, unspecified 02/05/2014  . Tobacco use disorder 02/05/2014  . Obstructive hydrocephalus 02/01/2014  . Cerebellar stroke 01/31/2014  . ABDOMINAL PAIN 08/22/2009  . KNEE PAIN, LEFT, CHRONIC 02/21/2008  . HYPERLIPIDEMIA 12/07/2007  . ANXIETY 12/07/2007  . DEPRESSION 12/07/2007  . HYPERTENSION 12/07/2007   Past Medical History:  Past Medical History  Diagnosis Date  . Anxiety   . Depression   . Hypertension   . Hyperlipidemia   . Hx of abnormal Pap smear    Past Surgical History:  Past Surgical History  Procedure Laterality Date  . Abdominal hysterectomy  1982    Assessment / Plan / Recommendation Clinical Impression Pt presents with higher level cognitive impairments impacting higher level problem solving, attention and organization of thoughts given her right PICA CVA occuring on 01/31/14.  Speech and language skills intact for current environment-although higher level expression at times is disorganized with decreased topic maintenance - likely due to decreased attention.  Pt is oriented x4, able to follow multiple step commands and stated all of her home medications and prior medical event without difficulties.    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  Pt reports agreement to  goals and plan.     Skilled Therapeutic Interventions          Initiated treatment by eduacting pt to findings of evaluation, functional cognitive linguistic changes with her CVA, specific SLP goals and means to obtain, verbal and visual cues,functional tasks during session   SLP Assessment  Patient will need skilled [facility] during CIR admission    Recommendations    n/a   SLP Frequency 3 out of 7 days   SLP Treatment/Interventions Cognitive remediation/compensation;Cueing hierarchy;Patient/family education;Environmental controls;Functional tasks;Internal/external aids    Pain Pain Assessment Pain Assessment: No/denies pain Prior Functioning Cognitive/Linguistic Baseline: Within functional limits Type of Home: House  Lives With: Alone Available Help at Discharge:  (daughter may come to stay with pt short term after CIR dc if indicated per pt) Education: Master's Degree NP Vocation: Retired  Industrial/product designer Term Goals: Week 1: SLP Short Term Goal 1 (Week 1): Pt will demonstrate selective attention during moderately complex tasks for 15 minutes with Mod I.  SLP Short Term Goal 2 (Week 1): Pt will demonstrate moderately complex problem solving skills for functional acitivities including medicine management, bill paying, etc with moderate verbal, visual cues and 80% accuracy.  SLP Short Term Goal 3 (Week 1): Pt will read multiple paragraph level material and answer questions correctly 75% of opportunities with minimal verbal cues.    See FIM for current functional status Refer to Care Plan for Long Term Goals  Recommendations for other services: None  Discharge Criteria: Patient will be discharged from SLP if patient refuses treatment 3 consecutive times without medical reason, if treatment goals not met, if there is a change in medical status, if  patient makes no progress towards goals or if patient is discharged from hospital.  The above assessment, treatment plan,  treatment alternatives and goals were discussed and mutually agreed upon: by patient  Luanna Salk, [facility] SLP [phone]

## 2014-02-07 NOTE — Evaluation (Signed)
Occupational Therapy Assessment and Plan  Patient Details  Name: Tammy Barron MRN: [id number] Date of Birth: Dec 10, 1940  OT Diagnosis: ataxia, cognitive deficits, disturbance of vision and muscle weakness (generalized) Rehab Potential: Rehab Potential: Good ELOS: 7-10 days   Today's Date: 02/07/2014 Time: 0903-1000 Time Calculation (min): 57 min  Problem List:  Patient Active Problem List   Diagnosis Date Noted  . CVA (cerebral infarction) 02/06/2014  . Thrombocytopenia, unspecified 02/06/2014  . Atrial fibrillation 02/05/2014  . Hypernatremia, induced 02/05/2014  . Obesity, unspecified 02/05/2014  . Tobacco use disorder 02/05/2014  . Obstructive hydrocephalus 02/01/2014  . Cerebellar stroke 01/31/2014  . ABDOMINAL PAIN 08/22/2009  . KNEE PAIN, LEFT, CHRONIC 02/21/2008  . HYPERLIPIDEMIA 12/07/2007  . ANXIETY 12/07/2007  . DEPRESSION 12/07/2007  . HYPERTENSION 12/07/2007    Past Medical History:  Past Medical History  Diagnosis Date  . Anxiety   . Depression   . Hypertension   . Hyperlipidemia   . Hx of abnormal Pap smear    Past Surgical History:  Past Surgical History  Procedure Laterality Date  . Abdominal hysterectomy  1982    Assessment & Plan Clinical Impression: Patient is a 73 y.o. year old female with history of HTN, anxiety/depression, tobacco abuse who was admitted on 01/31/14 with two day history of dizziness, N &V progressing to difficulty walking. CT head with extensive right cerebellar edema causing mass effect and effacement on the fourth ventricle and relative hydrocephalus of the lateral and third ventricles and concern of potential mass right cerebellum v/s acute infarct. MRI/MRA brain with acute R-PICA infarct in cerebellum with edema and mass effect on 4th ventricle without hydro and occlusion of R-PICA c/w acute infarct. She was started on hypertonic saline for dema control and ASA 325 mg for secondary stroke prevention. 2D echo with EF 55-65%  with no wall abnormalities and grade 1 diastolic dysfunction. Carotid dopplers with 60-79% R- ICA stenosis, R-VA retrograde flow and inability to visualize L-ICA due to dressings. New onset A fib treated with IV Cardizem. Follow up CCT with evolution and worsening of posterior mass effect with increasing hydro and no hemorrhagic conversion. Patient stable neurologically and being monitored in ICU. Stroke felt to be cardioembolic. She has had increase in wheezing with BLE edema and was treated with IV diuresis last night and this am. She continues to have ataxic gait and CIR recommended by rehab team.   Patient transferred to [place] on 02/06/2014 .    Patient currently requires min with basic self-care skills secondary to muscle weakness, ataxia and decreased coordination, decreased visual acuity and decreased visual motor skills and decreased attention, decreased problem solving, decreased safety awareness and delayed processing.  Prior to hospitalization, patient could complete ADLs with independent .  Patient will benefit from skilled intervention to increase independence with basic self-care skills and increase level of independence with iADL prior to discharge home with care partner.  Anticipate patient will require intermittent supervision and no further OT follow recommended.  OT - End of Session Activity Tolerance: Tolerates 30+ min activity with multiple rests OT Assessment Rehab Potential: Good Barriers to Discharge: Decreased caregiver support Barriers to Discharge Comments: reports daughter may be able to stay with her for a short period of time OT Patient demonstrates impairments in the following area(s): Balance;Cognition;Endurance;Motor;Safety;Vision OT Basic ADL's Functional Problem(s): Grooming;Bathing;Dressing;Toileting OT Advanced ADL's Functional Problem(s): Simple Meal Preparation;Light Housekeeping OT Transfers Functional Problem(s): Toilet;Tub/Shower OT Additional Impairment(s):  None OT Plan OT Intensity: Minimum of 1-2 x/day, 45  to 90 minutes OT Frequency: 5 out of 7 days OT Duration/Estimated Length of Stay: 7-10 days OT Treatment/Interventions: Balance/vestibular training;Cognitive remediation/compensation;Community reintegration;Discharge planning;Disease mangement/prevention;DME/adaptive equipment instruction;Functional mobility training;Neuromuscular re-education;Pain management;Patient/family education;Psychosocial support;Self Care/advanced ADL retraining;Therapeutic Activities;Therapeutic Exercise;UE/LE Coordination activities;Visual/perceptual remediation/compensation OT Self Feeding Anticipated Outcome(s): independent OT Basic Self-Care Anticipated Outcome(s): mod I OT Toileting Anticipated Outcome(s): mod I OT Bathroom Transfers Anticipated Outcome(s): supervision OT Recommendation Patient destination: Home Follow Up Recommendations: Other (comment) (intermittent supervision) Equipment Recommended: Tub/shower seat Equipment Details: pt reports she may still have shower chair from husband    Skilled Therapeutic Intervention OT eval initiated, ADL assessment completed at sit > stand level with overall min-mod assist.  Pt requested to complete bathing at shower level.  Performed stand pivot transfer bed > w/c > tub bench in walk-in shower with min assist.  Pt fatigued post bathing, requiring rest break prior to completing dressing task.  Pt with mild ataxia and decreased coordination with dressing tasks.  OT Evaluation Precautions/Restrictions  Precautions Precautions: Fall Precaution Comments: Cerebellar stroke - ataxia; Notify physician if increase in NIHSS score of 4 or more, patient c/o severe headache, develops nausea/vomiting, pulse less than 50 or greater than 500, systolic BP less than 370 or greater than or equal to 488, diastolic BP less than 70 or greater than or equal to 105, respiratory rate greater than 30, temperature greater than 100.5  F Restrictions Weight Bearing Restrictions: No General Missed Time Reason: Patient fatigue Vital Signs Therapy Vitals Pulse Rate: 71 BP: 148/76 mmHg Patient Position, if appropriate: Sitting Oxygen Therapy SpO2: 93 % O2 Device: None (Room air) Pain Pain Assessment Pain Assessment: No/denies pain Home Living/Prior Functioning Home Living Family/patient expects to be discharged to:: Private residence Living Arrangements: Alone Available Help at Discharge:  (daughter may come to stay with pt short term after CIR dc if indicated per pt) Type of Home: House Home Access: Stairs to enter [employer] of Steps: reports 2 and then later reported 4 steps Home Layout: One level Additional Comments: husband has RW, BSC, and shower chair. Pt unsure if still has these items  Lives With: Alone IADL History Homemaking Responsibilities: Yes Meal Prep Responsibility: Primary Laundry Responsibility: Primary Cleaning Responsibility: Primary Bill Paying/Finance Responsibility: Primary Shopping Responsibility: Primary Current License: Yes Education: Master's Degree NP Occupation: Retired Type of Occupation: Retired Therapist, nutritional Level of Independence: Independent with basic ADLs;Independent with homemaking with ambulation;Independent with gait;Independent with transfers  Able to Take Stairs?: Yes Driving: Yes Vocation: Retired ADL  See FIM Vision/Perception  Vision- History Baseline Vision/History: Wears glasses Wears Glasses: Reading only Patient Visual Report: Blurring of vision Vision- Assessment Vision Assessment?: Yes Eye Alignment: Within Functional Limits Ocular Range of Motion: Within Functional Limits Alignment/Gaze Preference: Within Defined Limits Tracking/Visual Pursuits: Decreased smoothness of horizontal tracking Saccades: Additional eye shifts occurred during testing;Decreased speed of saccadic movement Convergence: Within functional limits Visual  Fields: No apparent deficits Depth Perception: Undershoots  Cognition Overall Cognitive Status: No family/caregiver present to determine baseline cognitive functioning Arousal/Alertness: Awake/alert Orientation Level: Oriented X4 Attention: Selective;Sustained Sustained Attention: Appears intact Selective Attention: Impaired Selective Attention Impairment: Functional basic (pt distracted by activities around her, internally distracted ) Memory: Appears intact (for working memory and recall within 2 minutes- further assessment indicated) Awareness: Impaired Awareness Impairment: Intellectual impairment (pt denies cognitive changes, uncertain if personality or deficits) Problem Solving: Impaired (basic problem solving, need to call for assist, etc functional, high level checkbook task decreased ability- pt states she does not balance checkbook at  home ) Problem Solving Impairment: Functional complex Executive Function:  (furhter testing indicated) Behaviors: Impulsive Sensation Sensation Light Touch: Appears Intact Stereognosis: Not tested Hot/Cold: Not tested Proprioception: Appears Intact Coordination Fine Motor Movements are Fluid and Coordinated: No Finger Nose Finger Test: mild ataxia and discoordination RUE Extremity/Trunk Assessment RUE Assessment RUE Assessment: Within Functional Limits LUE Assessment LUE Assessment: Within Functional Limits  FIM:  FIM - Grooming Grooming Steps: Wash, rinse, dry face;Wash, rinse, dry hands;Oral care, brush teeth, clean dentures;Brush, comb hair Grooming: 5: Set-up assist to obtain items FIM - Bathing Bathing Steps Patient Completed: Chest;Right Arm;Left Arm;Abdomen;Front perineal area;Buttocks;Right upper leg;Left upper leg Bathing: 4: Min-Patient completes 8-9 [id number] parts or 75+ percent FIM - Upper Body Dressing/Undressing Upper body dressing/undressing steps patient completed: Thread/unthread right sleeve of front closure  shirt/dress;Thread/unthread left sleeve of front closure shirt/dress;Pull shirt around back of front closure shirt/dress;Button/unbutton shirt Upper body dressing/undressing: 5: Set-up assist to: Obtain clothing/put away FIM - Lower Body Dressing/Undressing Lower body dressing/undressing steps patient completed: Thread/unthread right pants leg;Thread/unthread left pants leg;Pull pants up/down;Don/Doff right sock;Don/Doff left sock Lower body dressing/undressing: 3: Mod-Patient completed 50-74% of tasks FIM - BEngineer, siteAssistive Devices: Bed rails;Arm rests;HOB elevated Bed/Chair Transfer: 4: Supine > Sit: Min A (steadying Pt. > 75%/lift 1 leg);4: Bed > Chair or W/C: Min A (steadying Pt. > 75%) FIM - Tub/Shower Transfers Tub/Shower Assistive Devices: Tub transfer bench;Grab bars;Walk in shower Tub/shower Transfers: 4-Into Tub/Shower: Min A (steadying Pt. > 75%/lift 1 leg);4-Out of Tub/Shower: Min A (steadying Pt. > 75%/lift 1 leg)   Refer to Care Plan for Long Term Goals  Recommendations for other services: None  Discharge Criteria: Patient will be discharged from OT if patient refuses treatment 3 consecutive times without medical reason, if treatment goals not met, if there is a change in medical status, if patient makes no progress towards goals or if patient is discharged from hospital.  The above assessment, treatment plan, treatment alternatives and goals were discussed and mutually agreed upon: by patient  SKerrie Buffalo4/06/2014, 11:36 AM

## 2014-02-07 NOTE — Evaluation (Signed)
Physical Therapy Assessment and Plan  Patient Details  Name: Tammy Barron MRN: [id number] Date of Birth: Apr 21, 1941  PT Diagnosis: Abnormality of gait, Ataxia, Coordination disorder, Difficulty walking, Dizziness and giddiness, Edema and Muscle weakness Rehab Potential: Excellent ELOS: 10 days   Today's Date: 02/07/2014 Time: 1021-1100 Time Calculation (min): 39 min  Problem List:  Patient Active Problem List   Diagnosis Date Noted  . CVA (cerebral infarction) 02/06/2014  . Thrombocytopenia, unspecified 02/06/2014  . Atrial fibrillation 02/05/2014  . Hypernatremia, induced 02/05/2014  . Obesity, unspecified 02/05/2014  . Tobacco use disorder 02/05/2014  . Obstructive hydrocephalus 02/01/2014  . Cerebellar stroke 01/31/2014  . ABDOMINAL PAIN 08/22/2009  . KNEE PAIN, LEFT, CHRONIC 02/21/2008  . HYPERLIPIDEMIA 12/07/2007  . ANXIETY 12/07/2007  . DEPRESSION 12/07/2007  . HYPERTENSION 12/07/2007    Past Medical History:  Past Medical History  Diagnosis Date  . Anxiety   . Depression   . Hypertension   . Hyperlipidemia   . Hx of abnormal Pap smear    Past Surgical History:  Past Surgical History  Procedure Laterality Date  . Abdominal hysterectomy  1982    Assessment & Plan Clinical Impression: Patient is a 73 y.o. female with history of HTN, anxiety/depression, tobacco abuse who was admitted on 01/31/14 with two day history of dizziness, N &V progressing to difficulty walking. CT head with extensive right cerebellar edema causing mass effect and effacement on the fourth ventricle and relative hydrocephalus of the lateral and third ventricles and concern of potential mass right cerebellum v/s acute infarct. MRI/MRA brain with acute R-PICA infarct in cerebellum with edema and mass effect on 4th ventricle without hydro and occlusion of R-PICA c/w acute infarct. She was started on hypertonic saline for dema control and ASA 325 mg for secondary stroke prevention. 2D echo with  EF 55-65% with no wall abnormalities and grade 1 diastolic dysfunction. Carotid dopplers with 60-79% R- ICA stenosis, R-VA retrograde flow and inability to visualize L-ICA due to dressings. New onset A fib treated with IV Cardizem. Follow up CCT with evolution and worsening of posterior mass effect with increasing hydro and no hemorrhagic conversion. Patient stable neurologically and being monitored in ICU. Stroke felt to be cardioembolic. She has had increase in wheezing with BLE edema and was treated with IV diuresis last night and this am.  Patient transferred to CIR on 02/06/2014.   Patient currently requires mod with mobility secondary to muscle weakness, decreased cardiorespiratoy endurance, ataxia and decreased coordination, decreased visual motor skills, central origin and decreased standing balance, decreased postural control and decreased balance strategies.  Prior to hospitalization, patient was independent  with mobility and lived with Alone in a House home.  Home access is Back entrance 5 STE with L railStairs to enter.  Patient will benefit from skilled PT intervention to maximize safe functional mobility and minimize fall risk for planned discharge home with intermittent assist.  Anticipate patient will benefit from follow up OP at discharge.  PT - End of Session Activity Tolerance: Decreased this session Endurance Deficit: Yes Endurance Deficit Description: SOB and significant fatigue after multiple sessions in a row.   PT Assessment Rehab Potential: Excellent Barriers to Discharge: Decreased caregiver support Barriers to Discharge Comments: lives alone but daughter may be able to come and stay 1-2 weeks at D/C PT Patient demonstrates impairments in the following area(s): Balance;Edema;Endurance;Motor PT Transfers Functional Problem(s): Bed Mobility;Bed to Chair;Car;Furniture;Floor PT Locomotion Functional Problem(s): Ambulation;Stairs PT Plan PT Intensity: Minimum of 1-2 x/day ,45  to  90 minutes PT Frequency: 5 out of 7 days PT Duration Estimated Length of Stay: 10 days PT Treatment/Interventions: Ambulation/gait training;Balance/vestibular training;Discharge planning;Community reintegration;DME/adaptive equipment instruction;Neuromuscular re-education;Functional mobility training;Patient/family education;Therapeutic Activities;Therapeutic Exercise;UE/LE Strength taining/ROM;UE/LE Coordination activities;Stair training PT Transfers Anticipated Outcome(s): Mod I PT Locomotion Anticipated Outcome(s): Mod I PT Recommendation Follow Up Recommendations: Outpatient PT (if she has a ride) Patient destination: Home Equipment Recommended: To be determined  Skilled Therapeutic Intervention Pt extremely fatigued after first two therapies and requesting to return to bed. With encouragement pt agreeable to PT eval.  Performed therapy evaluation and visual and vestibular screen.  Pt does demonstrate impairments in conjugate gaze and impaired gaze stabilization.  Will initiate gaze stabilization exercises tomorrow. Following evaluation pt requesting to end evaluation early and return to bed to rest.  Pt transferred w/c > bed stand pivot with mod HHA and sit >supine supervision.  Discussed goals and focus of therapy.  Pt verbalized agreement.  Also discussed safety and use of call bell for transfers.  Pt verbalized agreement.    PT Evaluation Precautions/Restrictions Precautions Precautions: Fall Precaution Comments: Cerebellar stroke - ataxia; Notify physician if increase in NIHSS score of 4 or more, patient c/o severe headache, develops nausea/vomiting, pulse less than 50 or greater than 914, systolic BP less than 782 or greater than or equal to 956, diastolic BP less than 70 or greater than or equal to 105, respiratory rate greater than 30, temperature greater than 100.5 F Restrictions Weight Bearing Restrictions: No General Chart Reviewed: Yes Amount of Missed PT Time (min): 21  Minutes Missed Time Reason: Patient fatigue Response to Previous Treatment: Patient reporting fatigue but able to participate. Family/Caregiver Present: No Vital SignsTherapy Vitals Pulse Rate: 72 BP: 148/76 mmHg Patient Position, if appropriate: Sitting Oxygen Therapy SpO2: 96 % O2 Device: None (Room air) Pain Pain Assessment Pain Assessment: No/denies pain Home Living/Prior Functioning Home Living Available Help at Discharge: Other (Comment) (none) Type of Home: House Home Access: Stairs to enter [employer] of Steps: Back entrance 5 STE with L rail Entrance Stairs-Rails: Left Home Layout: One level Additional Comments: husband had RW, BSC, and shower chair prior to passing away. Pt unsure if still has these items  Lives With: Alone Prior Function Level of Independence: Independent with homemaking with ambulation;Independent with gait;Independent with transfers  Able to Take Stairs?: Yes Driving: Yes Vocation: Retired Leisure: Hobbies-no Comments: Reports she didn't do a lot Vision/Perception  Vision - Assessment Eye Alignment: Within Functional Limits Ocular Range of Motion: Within Functional Limits Alignment/Gaze Preference: Within Defined Limits Tracking/Visual Pursuits: Decreased smoothness of horizontal tracking;Requires cues, head turns, or add eye shifts to track;Other (comment) (L eye difficulty tracking medially with extra head turns; impaired conjugate gaze) Saccades: Additional eye shifts occurred during testing;Decreased speed of saccadic movement Convergence: Within functional limits  Cognition Overall Cognitive Status: No family/caregiver present to determine baseline cognitive functioning Arousal/Alertness: Awake/alert Orientation Level: Oriented X4 Attention: Selective;Sustained Sustained Attention: Appears intact Selective Attention: Impaired Selective Attention Impairment: Functional basic (pt distracted by activities around her, internally  distracted ) Memory: Appears intact (for working memory and recall within 2 minutes- further assessment indicated) Awareness: Impaired Awareness Impairment: Intellectual impairment (pt denies cognitive changes, uncertain if personality or deficits) Problem Solving: Impaired (basic problem solving, need to call for assist, etc functional, high level checkbook task decreased ability- pt states she does not balance checkbook at home ) Problem Solving Impairment: Functional complex Executive Function:  (furhter testing indicated) Behaviors: Impulsive Sensation Sensation Light Touch:  Appears Intact Stereognosis: Not tested Hot/Cold: Not tested Proprioception: Appears Intact Coordination Gross Motor Movements are Fluid and Coordinated: No Fine Motor Movements are Fluid and Coordinated: No Finger Nose Finger Test: mild ataxia and discoordination RUE Heel Shin Test: Mild dysmetria LLE Motor  Motor Motor: Ataxia Motor - Skilled Clinical Observations: LLE mild ataxia and LLE weakness/impaired coordination  Mobility Bed Mobility Bed Mobility: Sit to Supine Sit to Supine: 5: Supervision Transfers Transfers: Yes Stand Pivot Transfers: 3: Mod assist Stand Pivot Transfer Details (indicate cue type and reason): Requires assistance for lifting to stand and required mod HHA to pivot secondary to impaired coordination and balance Locomotion  Ambulation Ambulation/Gait Assistance: 3: Mod assist Ambulation Distance (Feet): 50 Feet Ambulation/Gait Assistance Details: Required mod HHA for gait in controlled environment with changes in direction and head turns secondary to impaired coordination, dizziness and imbalance Gait Gait: Yes Gait Pattern: Impaired Gait Pattern: Step-through pattern;Decreased step length - right;Decreased step length - left;Decreased stance time - left;Decreased stride length;Decreased trunk rotation;Narrow base of support (impaired coordination) Stairs / Additional  Locomotion Stairs: Yes Stairs Assistance: 4: Min assist Stairs Assistance Details (indicate cue type and reason): Pt self selects step to sequence secondary to premorbid ankle injury.  Required min A for balance but no verbal cues for sequencing. Stair Management Technique: Two rails;Step to pattern;Forwards Number of Stairs: 5 Height of Stairs: 6 Wheelchair Mobility Wheelchair Mobility: Yes Wheelchair Assistance: 4: Energy manager: Both upper extremities Wheelchair Parts Management: Needs assistance Distance: 100; min A to maintain straight navigation secondary to veering to L  Trunk/Postural Assessment  Cervical Assessment Cervical Assessment: Within Functional Limits Thoracic Assessment Thoracic Assessment: Within Functional Limits Lumbar Assessment Lumbar Assessment: Within Functional Limits Postural Control Postural Control:  (impaired balance reactions in standing)  Balance Static Sitting Balance Static Sitting - Level of Assistance: 7: Independent Dynamic Sitting Balance Dynamic Sitting - Level of Assistance: 6: Modified independent (Device/Increase time) Static Standing Balance Static Standing - Level of Assistance: 4: Min assist Dynamic Standing Balance Dynamic Standing - Level of Assistance: 3: Mod assist Extremity Assessment  RUE Assessment RUE Assessment: Within Functional Limits LUE Assessment LUE Assessment: Within Functional Limits RLE Assessment RLE Assessment: Within Functional Limits (except hip flexion 4-/5) LLE Assessment LLE Assessment: Exceptions to [facility] LLE Strength LLE Overall Strength: Deficits LLE Overall Strength Comments: 4-/5  FIM:  FIM - Bed/Chair Transfer Bed/Chair Transfer Assistive Devices: Bed rails;Arm rests;HOB elevated Bed/Chair Transfer: 5: Sit > Supine: Supervision (verbal cues/safety issues);3: Bed > Chair or W/C: Mod A (lift or lower assist);3: Chair or W/C > Bed: Mod A (lift or lower assist) FIM - Locomotion:  Wheelchair Distance: 100; min A to maintain straight navigation secondary to veering to L Locomotion: Wheelchair: 2: Travels 50 - 149 ft with minimal assistance (Pt.>75%) FIM - Locomotion: Ambulation Ambulation/Gait Assistance: 3: Mod assist Locomotion: Ambulation: 2: Travels 50 - 149 ft with moderate assistance (Pt: 50 - 74%) FIM - Locomotion: Stairs Locomotion: Scientist, physiological: Hand rail - 2 Locomotion: Stairs: 2: Up and Down 4 - 11 stairs with minimal assistance (Pt.>75%)   Refer to Care Plan for Long Term Goals  Recommendations for other services: None  Discharge Criteria: Patient will be discharged from PT if patient refuses treatment 3 consecutive times without medical reason, if treatment goals not met, if there is a change in medical status, if patient makes no progress towards goals or if patient is discharged from hospital.  The above assessment, treatment plan, treatment alternatives and  goals were discussed and mutually agreed upon: by patient  Malachy Mood 02/07/2014, 11:58 AM

## 2014-02-07 NOTE — Progress Notes (Signed)
Occupational Therapy Session Note  Patient Details  Name: Tammy Barron MRN: [id number] Date of Birth: 1941-05-17  Today's Date: 02/07/2014 Time: [id number] Time Calculation (min): 28 min  Short Term Goals: Week 1:  OT Short Term Goal 1 (Week 1): Pt will complete walk-in shower transfer at supervision level ambulating with necessary AD OT Short Term Goal 2 (Week 1): Pt will complete toilet transfer at supervision level ambulating with necessary AD OT Short Term Goal 3 (Week 1): Pt will complete LB dressing with supervision OT Short Term Goal 4 (Week 1): Pt will complete simple meal prep with supervision with necessary AD  Skilled Therapeutic Interventions/Progress Updates:    Engaged in NM re-ed [facility] and functional mobility without RW.  Pt asleep upon arrival, but easily aroused.  Pt sat EOB supervision while completing 9 hole peg test.  Rt: 36 seconds and Lt: 38 seconds with no ataxia or coordination difficulties during this task, pt reports coordination having improved greatly since initial arrival at hospital.  Ambulated to bathroom with HHA with pt with slight ataxic gait and decreased balance to Rt.  Pt able to complete 3/3 toileting steps with supervision.  Returned to sink to wash hands with pt leaning against sink for stability.  Pt requested to return to bed at end of session.  Therapy Documentation Precautions:  Precautions Precautions: Fall Precaution Comments: Cerebellar stroke - ataxia; Notify physician if increase in NIHSS score of 4 or more, patient c/o severe headache, develops nausea/vomiting, pulse less than 50 or greater than 120, systolic BP less than 100 or greater than or equal to 180, diastolic BP less than 70 or greater than or equal to 105, respiratory rate greater than 30, temperature greater than 100.5 F Restrictions Weight Bearing Restrictions: No General: General Missed Time Reason: Patient fatigue Vital Signs: Therapy Vitals Pulse Rate:  72 Oxygen Therapy SpO2: 96 % O2 Device: None (Room air) Pain: Pain Assessment Pain Assessment: No/denies pain Pain Score: 0-No pain  See FIM for current functional status  Therapy/Group: Individual Therapy  Dillard EssexSarah M Ludwig Tugwell 02/07/2014, 2:33 PM

## 2014-02-08 ENCOUNTER — Inpatient Hospital Stay (HOSPITAL_COMMUNITY): Payer: Medicare Other | Admitting: *Deleted

## 2014-02-08 ENCOUNTER — Inpatient Hospital Stay (HOSPITAL_COMMUNITY): Payer: Medicare Other | Admitting: Occupational Therapy

## 2014-02-08 ENCOUNTER — Inpatient Hospital Stay (HOSPITAL_COMMUNITY): Payer: Medicare Other | Admitting: Physical Therapy

## 2014-02-08 ENCOUNTER — Inpatient Hospital Stay (HOSPITAL_COMMUNITY): Payer: Medicare Other | Admitting: Speech Pathology

## 2014-02-08 LAB — URINALYSIS, ROUTINE W REFLEX MICROSCOPIC
Bilirubin Urine: NEGATIVE
GLUCOSE, UA: NEGATIVE mg/dL
Hgb urine dipstick: NEGATIVE
Ketones, ur: NEGATIVE mg/dL
LEUKOCYTES UA: NEGATIVE
Nitrite: NEGATIVE
PH: 7 (ref 5.0–8.0)
Protein, ur: NEGATIVE mg/dL
Specific Gravity, Urine: 1.011 (ref 1.005–1.030)
Urobilinogen, UA: 1 mg/dL (ref 0.0–1.0)

## 2014-02-08 LAB — URINE CULTURE

## 2014-02-08 MED ORDER — OXYBUTYNIN CHLORIDE 5 MG PO TABS
5.0000 mg | ORAL_TABLET | Freq: Every evening | ORAL | Status: DC | PRN
  Administered 2014-02-08 (×2): 5 mg via ORAL
  Filled 2014-02-08 (×4): qty 1

## 2014-02-08 MED ORDER — LEVOFLOXACIN 250 MG PO TABS
250.0000 mg | ORAL_TABLET | Freq: Every day | ORAL | Status: AC
  Administered 2014-02-08 – 2014-02-12 (×5): 250 mg via ORAL
  Filled 2014-02-08 (×5): qty 1

## 2014-02-08 NOTE — IPOC Note (Addendum)
Overall Plan of Care [facility](IPOC) Patient Details Name: Tammy ClarksMargaret L Barron MRN: [id number] DOB: [birth year]  Admitting Diagnosis: RT CVA  Hospital Problems: Principal Problem:   Cerebellar stroke Active Problems:   ANXIETY   HYPERTENSION   Atrial fibrillation   Thrombocytopenia, unspecified     Functional Problem List: Nursing Bladder;Bowel;Endurance;Medication Management;Motor  PT Balance;Edema;Endurance;Motor  OT Balance;Cognition;Endurance;Motor;Safety;Vision  SLP Cognition  TR  activity tolerance, balance, cognition, safety, vision       Basic ADL's: OT Grooming;Bathing;Dressing;Toileting     Advanced  ADL's: OT Simple Meal Preparation;Light Housekeeping     Transfers: PT Bed Mobility;Bed to Chair;Car;Furniture;Floor  OT Toilet;Tub/Shower     Locomotion: PT Ambulation;Stairs     Additional Impairments: OT None  SLP Social Cognition   Attention;Problem Solving  TR      Anticipated Outcomes Item Anticipated Outcome  Self Feeding independent  Swallowing      Basic self-care  mod I  Toileting  mod I   Bathroom Transfers supervision  Bowel/Bladder  mod I  Transfers  Mod I  Locomotion  Mod I  Communication     Cognition  mod I  Pain  less than 2  Safety/Judgment  mod I   Therapy Plan: PT Intensity: Minimum of 1-2 x/day ,45 to 90 minutes PT Frequency: 5 out of 7 days PT Duration Estimated Length of Stay: 10 days OT Intensity: Minimum of 1-2 x/day, 45 to 90 minutes OT Frequency: 5 out of 7 days OT Duration/Estimated Length of Stay: 7-10 days SLP Intensity: Minumum of 1-2 x/day, 30 to 90 minutes SLP Frequency: 3 out of 7 days SLP Duration/Estimated Length of Stay: 2 weeks       Team Interventions: Nursing Interventions Patient/Family Education;Bladder Management;Bowel Management;Disease Management/Prevention;Medication Management  PT interventions Ambulation/gait training;Balance/vestibular training;Discharge planning;Community  reintegration;DME/adaptive equipment instruction;Neuromuscular re-education;Functional mobility training;Patient/family education;Therapeutic Activities;Therapeutic Exercise;UE/LE Strength taining/ROM;UE/LE Coordination activities;Stair training  OT Interventions Balance/vestibular training;Cognitive remediation/compensation;Community reintegration;Discharge planning;Disease mangement/prevention;DME/adaptive equipment instruction;Functional mobility training;Neuromuscular re-education;Pain management;Patient/family education;Psychosocial support;Self Care/advanced ADL retraining;Therapeutic Activities;Therapeutic Exercise;UE/LE Coordination activities;Visual/perceptual remediation/compensation  SLP Interventions Cognitive remediation/compensation;Cueing hierarchy;Patient/family education;Environmental controls;Functional tasks;Internal/external aids  TR Interventions  recreation/leisure participation, therapeutic activities, community reintegration, pt/family education, adaptive equipment use, discharge planning, psychosocial support, balance/coordination  SW/CM Interventions      Team Discharge Planning: Destination: PT-Home ,OT- Home , SLP-Home Projected Follow-up: PT-Outpatient PT (if she has a ride), OT-  Other (comment) (intermittent supervision), SLP-Home Health SLP Projected Equipment Needs: PT-To be determined, OT- Tub/shower seat, SLP-None recommended by SLP Equipment Details: PT- , OT-pt reports she may still have shower chair from husband  Patient/family involved in discharge planning: PT- Patient,  OT-Patient, SLP-Patient  MD ELOS: 7-9d Medical Rehab Prognosis:  Good Assessment: 73 y.o. female with history of HTN, anxiety/depression, tobacco abuse who was admitted on 01/31/14 with two day history of dizziness, N &V progressing to difficulty walking. CT head with extensive right cerebellar edema causing mass effect and effacement on the fourth ventricle and relative hydrocephalus of the  lateral and third ventricles and concern of potential mass right cerebellum v/s acute infarct. MRI/MRA brain with acute R-PICA infarct in cerebellum with edema and mass effect on 4th ventricle without hydro and occlusion of R-PICA c/w acute infarct.  Now requiring 24/7 Rehab RN,MD, as well as CIR level PT, OT and SLP.  Treatment team will focus on ADLs and mobility with goals set at Mod I   See Team Conference Notes for weekly updates to the plan of care

## 2014-02-08 NOTE — Progress Notes (Signed)
Physical Therapy Session Note  Patient Details  Name: Tammy Barron MRN: [id number] Date of Birth: [birth year]  Today's Date: 02/08/2014 Time: 1100-1154 Time Calculation (min): 54 min  Short Term Goals: Week 1:  PT Short Term Goal 1 (Week 1): = LTG secondary to short LOS  Skilled Therapeutic Interventions/Progress Updates:    Pt received seated in w/c having just finished B&D. Pt reports feeling tired secondary to minimal sleeping last night; however, pt agreeable to attempting to participate in PT. Session focused on assessing/addressing stability with functional standing balance. Performed w/c mobility x150' in controlled environment with min, mod verbal cueing secondary to tendency to veer to L side. In gym, pt performed stand pivot transfer from bed<>chair with min A, L HHA. Berg Balance Scale completed with score of 28/56. See below for detailed findings. Therapist explained outcome measure and functional implications of findings; pt verbalized understanding. Onset of dizziness and pt-stated "blurred vision" noted with bilat cervical spine/trunk rotation in standing, static standing with eyes closed, and with turning while standing. Symptoms resolved within 10 seconds of seated rest break.  Transported pt to room in w/c secondary to pt fatigue. Upon returning to room, pt performed gait x10' with min A, L HHA. Therapist departed with pt seated EOB with all needs within reach. Pt reports no dizziness.  Therapy Documentation Precautions:  Precautions Precautions: Fall Precaution Comments: Cerebellar stroke - ataxia; Notify physician if increase in NIHSS score of 4 or more, patient c/o severe headache, develops nausea/vomiting, pulse less than 50 or greater than 120, systolic BP less than 100 or greater than or equal to 180, diastolic BP less than 70 or greater than or equal to 105, respiratory rate greater than 30, temperature greater than 100.5 F Restrictions Weight Bearing Restrictions:  No General: Amount of Missed PT Time (min): 6 Minutes Missed Time Reason: Patient fatigue Vital Signs: Therapy Vitals BP: 164/78 mmHg Pain: Pain Assessment Pain Assessment: No/denies pain Pain Score: 0-No pain Locomotion : Ambulation Ambulation/Gait Assistance: 4: Min assist Wheelchair Mobility Distance: 150  Balance: Standardized Balance Assessment Standardized Balance Assessment: Berg Balance Test Berg Balance Test Sit to Stand: Able to stand  independently using hands (Sit>stand without using hands requiring close supervision for safety) Standing Unsupported: Able to stand 2 minutes with supervision Sitting with Back Unsupported but Feet Supported on Floor or Stool: Able to sit safely and securely 2 minutes Stand to Sit: Sits safely with minimal use of hands Transfers: Able to transfer with verbal cueing and /or supervision Standing Unsupported with Eyes Closed: Able to stand 10 seconds with supervision (Pt exhibits multidirectional postural sway, reports increased dizziness) Standing Unsupported with Feet Together: Needs help to attain position but able to stand for 30 seconds with feet together From Standing, Reach Forward with Outstretched Arm: Can reach forward >12 cm safely (5") From Standing Position, Pick up Object from Floor: Unable to try/needs assist to keep balance (Ptt refused to attempt secondary to fear of falling) From Standing Position, Turn to Look Behind Over each Shoulder: Turn sideways only but maintains balance (Pt reports increased dizziness after turning) Turn 360 Degrees: Needs assistance while turning (Turns to L side in 12 seconds prior to LOB secondary to onset of dizziness) Standing Unsupported, Alternately Place Feet on Step/Stool: Able to complete >2 steps/needs minimal assist (Able to complete 4 steps with L HHA) Standing Unsupported, One Foot in Front: Needs help to step but can hold 15 seconds Standing on One Leg: Tries to lift leg/unable to hold 3  seconds but remains standing independently Total Score: 28  See FIM for current functional status  Therapy/Group: Individual Therapy  Carlena SaxBlair A Saanvika Vazques 02/08/2014, 12:09 PM

## 2014-02-08 NOTE — Progress Notes (Signed)
Recreational Therapy Session Note  Patient Details  Name: Cleda ClarksMargaret L Neuharth MRN: [id number] Date of Birth: [birth year] Today's Date: 02/08/2014  Pain: no c/o  Order received, chart reviewed, attempted eval today & session cancelled due to toileting issues.  Will complete eval early next week. Clois DupesLisa B Feliza Diven 02/08/2014, 3:48 PM

## 2014-02-08 NOTE — Progress Notes (Signed)
Subjective/Complaints: 73 y.o. female with history of HTN, anxiety/depression, tobacco abuse who was admitted on 01/31/14 with two day history of dizziness, N &V progressing to difficulty walking. CT head with extensive right cerebellar edema causing mass effect and effacement on the fourth ventricle and relative hydrocephalus of the lateral and third ventricles and concern of potential mass right cerebellum v/s acute infarct. MRI/MRA brain with acute R-PICA infarct in cerebellum with edema and mass effect on 4th ventricle without hydro and occlusion of R-PICA c/w acute infarct. She was started on hypertonic saline for dema control and ASA 325 mg for secondary stroke prevention. 2D echo with EF 55-65% with no wall abnormalities and grade 1 diastolic dysfunction. Carotid dopplers with 60-79% R- ICA stenosis, R-VA retrograde flow and inability to visualize L-ICA due to dressings. New onset A fib treated with IV Cardizem  Urinary freq last noc, no dysuria  Review of Systems - Negative except poor sleep  Objective: Vital Signs: Blood pressure 186/83, pulse 71, temperature 98.8 F (37.1 C), temperature source Oral, resp. rate 18, weight 89.2 kg (196 lb 10.4 oz), SpO2 94.00%. Dg Chest 2 View  02/06/2014   CLINICAL DATA:  Wheezing.  EXAM: CHEST  2 VIEW  COMPARISON:  DG CHEST 2 VIEW dated 02/03/2014  FINDINGS: Mediastinum and hilar structures are normal. Mild atelectasis versus infiltrate right lung base cannot be excluded. Small right pleural effusion cannot be excluded. Stable elevation of left hemidiaphragm. Stable cardiomegaly, normal pulmonary vascularity. No acute osseous abnormality.  IMPRESSION: 1. Mild infiltrate and/or atelectasis right lung base cannot be excluded. Associated small right pleural effusion cannot be excluded. 2. Stable cardiomegaly.  No CHF. 3. Stable elevation left hemidiaphragm .   Electronically Signed   By: Marcello Moores  Register   On: 02/06/2014 17:23   Results for orders placed  during the hospital encounter of 02/06/14 (from the past 72 hour(s))  URINALYSIS, ROUTINE W REFLEX MICROSCOPIC     Status: Abnormal   Collection Time    02/06/14  6:37 PM      Result Value Ref Range   Color, Urine YELLOW  YELLOW   APPearance CLEAR  CLEAR   Specific Gravity, Urine 1.011  1.005 - 1.030   pH 7.5  5.0 - 8.0   Glucose, UA NEGATIVE  NEGATIVE mg/dL   Hgb urine dipstick SMALL (*) NEGATIVE   Bilirubin Urine NEGATIVE  NEGATIVE   Ketones, ur NEGATIVE  NEGATIVE mg/dL   Protein, ur NEGATIVE  NEGATIVE mg/dL   Urobilinogen, UA 0.2  0.0 - 1.0 mg/dL   Nitrite NEGATIVE  NEGATIVE   Leukocytes, UA TRACE (*) NEGATIVE  URINE CULTURE     Status: None   Collection Time    02/06/14  6:37 PM      Result Value Ref Range   Specimen Description URINE, CATHETERIZED     Special Requests NONE     Culture  Setup Time       Value: 02/06/2014 19:19     Performed at [place]       Value: >=100,000 COLONIES/ML     Performed at [facility]   Culture       Value: STAPHYLOCOCCUS SPECIES (COAGULASE NEGATIVE)     Note: RIFAMPIN AND GENTAMICIN SHOULD NOT BE USED AS SINGLE DRUGS FOR TREATMENT OF STAPH INFECTIONS.     Performed at [facility]   Report Status PENDING    URINE MICROSCOPIC-ADD ON     Status: Abnormal  Collection Time    02/06/14  6:37 PM      Result Value Ref Range   Squamous Epithelial / LPF FEW (*) RARE   WBC, UA 3-6  <3 WBC/hpf   RBC / HPF 3-6  <3 RBC/hpf   Bacteria, UA FEW (*) RARE  BASIC METABOLIC PANEL     Status: Abnormal   Collection Time    02/06/14  6:56 PM      Result Value Ref Range   Sodium 142  137 - 147 mEq/L   Potassium 3.4 (*) 3.7 - 5.3 mEq/L   Chloride 99  96 - 112 mEq/L   CO2 30  19 - 32 mEq/L   Glucose, Bld 111 (*) 70 - 99 mg/dL   BUN 10  6 - 23 mg/dL   Creatinine, Ser 0.70  0.50 - 1.10 mg/dL   Calcium 9.2  8.4 - 10.5 mg/dL   GFR calc non Af Amer 83 (*) >90 mL/min   GFR calc Af Amer >90  >90 mL/min   Comment:  (NOTE)     The eGFR has been calculated using the CKD EPI equation.     This calculation has not been validated in all clinical situations.     eGFR's persistently <90 mL/min signify possible Chronic Kidney     Disease.  CBC WITH DIFFERENTIAL     Status: None   Collection Time    02/07/14  6:40 AM      Result Value Ref Range   WBC 8.5  4.0 - 10.5 K/uL   RBC 4.07  3.87 - 5.11 MIL/uL   Hemoglobin 12.6  12.0 - 15.0 g/dL   HCT 37.3  36.0 - 46.0 %   MCV 91.6  78.0 - 100.0 fL   MCH 31.0  26.0 - 34.0 pg   MCHC 33.8  30.0 - 36.0 g/dL   RDW 13.5  11.5 - 15.5 %   Platelets 174  150 - 400 K/uL   Neutrophils Relative % 71  43 - 77 %   Neutro Abs 6.0  1.7 - 7.7 K/uL   Lymphocytes Relative 19  12 - 46 %   Lymphs Abs 1.6  0.7 - 4.0 K/uL   Monocytes Relative 9  3 - 12 %   Monocytes Absolute 0.8  0.1 - 1.0 K/uL   Eosinophils Relative 1  0 - 5 %   Eosinophils Absolute 0.1  0.0 - 0.7 K/uL   Basophils Relative 0  0 - 1 %   Basophils Absolute 0.0  0.0 - 0.1 K/uL  COMPREHENSIVE METABOLIC PANEL     Status: Abnormal   Collection Time    02/07/14  6:40 AM      Result Value Ref Range   Sodium 143  137 - 147 mEq/L   Potassium 3.3 (*) 3.7 - 5.3 mEq/L   Chloride 99  96 - 112 mEq/L   CO2 32  19 - 32 mEq/L   Glucose, Bld 101 (*) 70 - 99 mg/dL   BUN 10  6 - 23 mg/dL   Creatinine, Ser 0.69  0.50 - 1.10 mg/dL   Calcium 9.1  8.4 - 10.5 mg/dL   Total Protein 6.2  6.0 - 8.3 g/dL   Albumin 2.8 (*) 3.5 - 5.2 g/dL   AST 25  0 - 37 U/L   ALT 30  0 - 35 U/L   Alkaline Phosphatase 49  39 - 117 U/L   Total Bilirubin 0.3  0.3 - 1.2 mg/dL  GFR calc non Af Amer 84 (*) >90 mL/min   GFR calc Af Amer >90  >90 mL/min   Comment: (NOTE)     The eGFR has been calculated using the CKD EPI equation.     This calculation has not been validated in all clinical situations.     eGFR's persistently <90 mL/min signify possible Chronic Kidney     Disease.     HEENT: normal Cardio: RRR and no murmur Resp: CTA B/L and  unlabored GI: BS positive and NT,ND Extremity:  Pulses positive and No Edema Skin:   Intact Neuro: Alert/Oriented, Cranial Nerve II-XII normal, Normal Motor and Abnormal FMC Ataxic/ dec FMC Musc/Skel:  Normal GEN NAD   Assessment/Plan: 1. Functional deficits secondary to Right PICA cardioembolic infarct which require 3+ hours per day of interdisciplinary therapy in a comprehensive inpatient rehab setting. Physiatrist is providing close team supervision and 24 hour management of active medical problems listed below. Physiatrist and rehab team continue to assess barriers to discharge/monitor patient progress toward functional and medical goals. FIM: FIM - Bathing Bathing Steps Patient Completed: Chest;Right Arm;Left Arm;Abdomen;Front perineal area;Buttocks;Right upper leg;Left upper leg Bathing: 4: Min-Patient completes 8-9 70f10 parts or 75+ percent  FIM - Upper Body Dressing/Undressing Upper body dressing/undressing steps patient completed: Thread/unthread right sleeve of front closure shirt/dress;Thread/unthread left sleeve of front closure shirt/dress;Pull shirt around back of front closure shirt/dress;Button/unbutton shirt Upper body dressing/undressing: 5: Set-up assist to: Obtain clothing/put away FIM - Lower Body Dressing/Undressing Lower body dressing/undressing steps patient completed: Thread/unthread right pants leg;Thread/unthread left pants leg;Pull pants up/down;Don/Doff right sock;Don/Doff left sock Lower body dressing/undressing: 3: Mod-Patient completed 50-74% of tasks  FIM - Toileting Toileting steps completed by patient: Adjust clothing prior to toileting;Performs perineal hygiene;Adjust clothing after toileting Toileting Assistive Devices: Grab bar or rail for support Toileting: 5: Supervision: Safety issues/verbal cues  FIM - TRadio producerDevices: Grab bars Toilet Transfers: 4-To toilet/BSC: Min A (steadying Pt. > 75%);4-From  toilet/BSC: Min A (steadying Pt. > 75%)  FIM - Bed/Chair Transfer Bed/Chair Transfer Assistive Devices: Bed rails;Arm rests;HOB elevated Bed/Chair Transfer: 5: Sit > Supine: Supervision (verbal cues/safety issues);3: Bed > Chair or W/C: Mod A (lift or lower assist);3: Chair or W/C > Bed: Mod A (lift or lower assist)  FIM - Locomotion: Wheelchair Distance: 100; min A to maintain straight navigation secondary to veering to L Locomotion: Wheelchair: 2: Travels 50 - 149 ft with minimal assistance (Pt.>75%) FIM - Locomotion: Ambulation Ambulation/Gait Assistance: 3: Mod assist Locomotion: Ambulation: 2: Travels 50 - 149 ft with moderate assistance (Pt: 50 - 74%)  Comprehension Comprehension Mode: Auditory Comprehension: 5-Follows basic conversation/direction: With no assist  Expression Expression Mode: Verbal Expression: 5-Expresses basic needs/ideas: With no assist  Social Interaction Social Interaction: 5-Interacts appropriately 90% of the time - Needs monitoring or encouragement for participation or interaction.  Problem Solving Problem Solving: 5-Solves basic 90% of the time/requires cueing < 10% of the time  Memory Memory: 4-Recognizes or recalls 75 - 89% of the time/requires cueing 10 - 24% of the time  Medical Problem List and Plan:  Right PICA infarct  1. DVT Prophylaxis/Anticoagulation: Pharmaceutical: Lovenox  2. Pain Management: Prn tylenol  3. Mood: Provide ego support. LCSW to follow for evaluation and support.  4. Neuropsych: This patient is capable of making decisions on her own behalf.  5. Fluid overload:no ivF.6. Induced Hypernatremia: Resolved.  7. Wheezing: likely has component of hyperactive airway disease v/s COPD. Will add nebs prn to  help with symptoms. Patient instructed to use flutter valve every 2 hours while awake.  8. HTN: Monitor with bid checks. Continue bisoprolol/HCTZ. Cardizem  9. ABLA: recheck CBC in am. Will order stool guaiacs.  10. Constipation:  Reports no BM since admission--will set bowel program.  11.  Urinary freq, suspect CVA related UA neg, may be still mobilizing fluid.  Trial ditropan  LOS (Days) 2 A FACE TO FACE EVALUATION WAS PERFORMED  Charlett Blake 02/08/2014, 8:15 AM

## 2014-02-08 NOTE — Progress Notes (Signed)
Occupational Therapy Note  Patient Details  Name: Cleda ClarksMargaret L Mota MRN: [id number] Date of Birth: 09/28/1941 Today's Date: 02/08/2014  Pt missed 45 mins skilled OT treatment session secondary to fatigue.  Pt reports she feels she "overdid it" with her therapies this AM after a night of little sleep, requesting to rest.  Encouraged pt to participate and made suggestions of treatment ideas with pt reporting "maybe we can do that tomorrow".  Will follow up as able.  Plan to spread out therapies to allow increased rest breaks during day.  Brand MalesSarah M Naome Brigandi 02/08/2014, 1:25 PM

## 2014-02-08 NOTE — Progress Notes (Signed)
Occupational Therapy Session Note  Patient Details  Name: Tammy ClarksMargaret L Barron MRN: [id number] Date of Birth: 1941-02-03  Today's Date: 02/08/2014 Time: 1030-1100 Time Calculation (min): 30 min  Short Term Goals: Week 1:  OT Short Term Goal 1 (Week 1): Pt will complete walk-in shower transfer at supervision level ambulating with necessary AD OT Short Term Goal 2 (Week 1): Pt will complete toilet transfer at supervision level ambulating with necessary AD OT Short Term Goal 3 (Week 1): Pt will complete LB dressing with supervision OT Short Term Goal 4 (Week 1): Pt will complete simple meal prep with supervision with necessary AD  Skilled Therapeutic Interventions/Progress Updates:    1) Engaged in ADL retraining with focus on transfers, standing balance, and self-care retraining with bathing and dressing.  Pt missed 30 mins initially with reports of fatigue from being up all night with multiple voids and requesting to rest some more.  Upon return, pt willing to participate in treatment session.  Ambulated to bathroom with HHA with pt clenching therapist's hands.  Pt urinated on toilet, then ambulated from toilet to walk-in shower with min assist and utilizing grab bars.  Bathing completed in sitting with exception of standing to wash buttocks.  Min/steady assist with LB dressing due to decreased balance in standing. Pt passed off to PT.  Therapy Documentation Precautions:  Precautions Precautions: Fall Precaution Comments: Cerebellar stroke - ataxia; Notify physician if increase in NIHSS score of 4 or more, patient c/o severe headache, develops nausea/vomiting, pulse less than 50 or greater than 120, systolic BP less than 100 or greater than or equal to 180, diastolic BP less than 70 or greater than or equal to 105, respiratory rate greater than 30, temperature greater than 100.5 F Restrictions Weight Bearing Restrictions: No General:   Vital Signs: Therapy Vitals BP: 164/78 mmHg Pain:  Pt  with no c/o pain  See FIM for current functional status  Therapy/Group: Individual Therapy  Brand MalesSarah M Pocahontas Cohenour 02/08/2014, 11:03 AM

## 2014-02-08 NOTE — Progress Notes (Addendum)
At approximately 1500 pm patient in therapy session with speech therapist with complaints of dizziness, nauseated, and headache. Patient assisted to bed by speech therapist. Noted patient color was pale vital signs obtained 176/72, 67, 95%. Notified Pam Love, PA instructed to recheck vital signs in a hour and continue to monitor patient.  Recheck vital signs 1531 pm 164/77, 65, 95% patient stated she was feeling better. Rechecked vital signs at 1750 pm 158/82, 70, 96%. Will continue to monitor patient.

## 2014-02-08 NOTE — Progress Notes (Signed)
Speech Language Pathology Daily Session Note  Patient Details  Name: Tammy ClarksMargaret L Lepore MRN: [id number] Date of Birth: July 17, 1941  Today's Date: 02/08/2014 Time: 1420-1435 Time Calculation (min): 15 min  Short Term Goals: Week 1: SLP Short Term Goal 1 (Week 1): Pt will demonstrate selective attention during moderately complex tasks for 15 minutes with Mod I.  SLP Short Term Goal 2 (Week 1): Pt will demonstrate moderately complex problem solving skills for functional acitivities including medicine management, bill paying, etc with moderate verbal, visual cues and 80% accuracy.  SLP Short Term Goal 3 (Week 1): Pt will read multiple paragraph level material and answer questions correctly 75% of opportunities with minimal verbal cues.    Skilled Therapeutic Interventions: Skilled treatment session focused on addressing cognitive goals. Patient required Supervision level verbal cues faded to Mod I with use of an external aid to recall procedures of a new learning task.  Patient labeled 2 effective memory compensatory strategy during task.  At end to session SLP provided Min physical assist to transfer chair to bed and reported immediate dizziness, nausea, tingling in right arm and expressed concern of another CVA.  RN was immediately called to room to assess patient.  Continue with current plan of care.   FIM:  Comprehension Comprehension Mode: Auditory Comprehension: 5-Understands complex 90% of the time/Cues < 10% of the time Expression Expression Mode: Verbal Expression: 5-Expresses complex 90% of the time/cues < 10% of the time Social Interaction Social Interaction: 5-Interacts appropriately 90% of the time - Needs monitoring or encouragement for participation or interaction. Problem Solving Problem Solving: 5-Solves basic 90% of the time/requires cueing < 10% of the time Memory Memory: 4-Recognizes or recalls 75 - 89% of the time/requires cueing 10 - 24% of the time  Pain Pain  Assessment Pain Assessment: No/denies pain  Therapy/Group: Individual Therapy  Charlane FerrettiMelissa Jasper Ruminski, M.A., CCC-SLP [phone]  Ophelia ShoulderMelissa M Shakeisha Horine 02/08/2014, 4:08 PM

## 2014-02-09 ENCOUNTER — Inpatient Hospital Stay (HOSPITAL_COMMUNITY): Payer: Medicare Other | Admitting: Physical Therapy

## 2014-02-09 ENCOUNTER — Inpatient Hospital Stay (HOSPITAL_COMMUNITY): Payer: Medicare Other | Admitting: Occupational Therapy

## 2014-02-09 ENCOUNTER — Inpatient Hospital Stay (HOSPITAL_COMMUNITY): Payer: Medicare Other | Admitting: Speech Pathology

## 2014-02-09 DIAGNOSIS — I69993 Ataxia following unspecified cerebrovascular disease: Secondary | ICD-10-CM

## 2014-02-09 DIAGNOSIS — I6350 Cerebral infarction due to unspecified occlusion or stenosis of unspecified cerebral artery: Secondary | ICD-10-CM

## 2014-02-09 LAB — BASIC METABOLIC PANEL
BUN: 13 mg/dL (ref 6–23)
CHLORIDE: 99 meq/L (ref 96–112)
CO2: 26 meq/L (ref 19–32)
Calcium: 9.5 mg/dL (ref 8.4–10.5)
Creatinine, Ser: 0.67 mg/dL (ref 0.50–1.10)
GFR calc Af Amer: >90 mL/min (ref >90)
GFR calc non Af Amer: 84 mL/min — ABNORMAL LOW (ref >90)
Glucose, Bld: 88 mg/dL (ref 70–99)
Potassium: 4 mEq/L (ref 3.7–5.3)
Sodium: 139 mEq/L (ref 137–147)

## 2014-02-09 MED ORDER — IMIPRAMINE HCL 25 MG PO TABS
25.0000 mg | ORAL_TABLET | Freq: Every day | ORAL | Status: DC
  Administered 2014-02-09 – 2014-02-11 (×3): 25 mg via ORAL
  Filled 2014-02-09 (×4): qty 1

## 2014-02-09 MED ORDER — IMIPRAMINE PAMOATE 75 MG PO CAPS
75.0000 mg | ORAL_CAPSULE | Freq: Every day | ORAL | Status: DC
  Filled 2014-02-09: qty 1

## 2014-02-09 MED ORDER — OXYBUTYNIN CHLORIDE 5 MG PO TABS
10.0000 mg | ORAL_TABLET | Freq: Every evening | ORAL | Status: DC | PRN
  Filled 2014-02-09 (×2): qty 2

## 2014-02-09 NOTE — Progress Notes (Signed)
Occupational Therapy Session Note  Patient Details  Name: Tammy Barron MRN: [id number] Date of Birth: 11/24/1940  Today's Date: 02/09/2014 Time: 1132-1207 Time Calculation (min): 35 min  Short Term Goals: Week 1:  OT Short Term Goal 1 (Week 1): Pt will complete walk-in shower transfer at supervision level ambulating with necessary AD OT Short Term Goal 2 (Week 1): Pt will complete toilet transfer at supervision level ambulating with necessary AD OT Short Term Goal 3 (Week 1): Pt will complete LB dressing with supervision OT Short Term Goal 4 (Week 1): Pt will complete simple meal prep with supervision with necessary AD  Skilled Therapeutic Interventions/Progress Updates:    Engaged in ADL retraining with focus on transfers, standing balance, and self-care retraining with bathing and dressing. Pt willing to participate in treatment session after missing earlier AM session and requesting to engage in bathing and dressing. Ambulated to bathroom with HHA with pt clenching therapist's hand. Pt urinated on toilet, then ambulated from toilet to walk-in shower with min assist and utilizing grab bars. Bathing completed in sitting with exception of standing to wash buttocks all with distant supervision. Educated pt on sitting to dry feet to increase safety due to decreased balance and c/o dizziness yesterday when bending forward to dry feet in standing.  Supervision with dressing tasks this session and setup for grooming tasks at seated level.  Encouraged pt to sit up for lunch to increase OOB tolerance.  Therapy Documentation Precautions:  Precautions Precautions: Fall Precaution Comments: Cerebellar stroke - ataxia; Notify physician if increase in NIHSS score of 4 or more, patient c/o severe headache, develops nausea/vomiting, pulse less than 50 or greater than 120, systolic BP less than 100 or greater than or equal to 180, diastolic BP less than 70 or greater than or equal to 105, respiratory rate  greater than 30, temperature greater than 100.5 F Restrictions Weight Bearing Restrictions: No General: General Amount of Missed OT Time (min): 60 Minutes Pain:  Pt with no c/o pain  See FIM for current functional status  Therapy/Group: Individual Therapy  Brand MalesSarah M Jozlyn Schatz 02/09/2014, 12:16 PM

## 2014-02-09 NOTE — Progress Notes (Signed)
Speech Language Pathology Daily Session Note  Patient Details  Name: Tammy Barron MRN: [id number] Date of Birth: [birth year]  Today's Date: 02/09/2014 Time: 1400-1445 Time Calculation (min): 45 min  Short Term Goals: Week 1: SLP Short Term Goal 1 (Week 1): Pt will demonstrate selective attention during moderately complex tasks for 15 minutes with Mod I.  SLP Short Term Goal 2 (Week 1): Pt will demonstrate moderately complex problem solving skills for functional acitivities including medicine management, bill paying, etc with moderate verbal, visual cues and 80% accuracy.  SLP Short Term Goal 3 (Week 1): Pt will read multiple paragraph level material and answer questions correctly 75% of opportunities with minimal verbal cues.    Skilled Therapeutic Interventions: Skilled treatment session focused on addressing cognitive goals. Patient required Mod verbal cues to recall current medications; as a result, SLP implemented external aid.  After implementation patient required Supervision level verbal cues to utilize external aid effectively.  Continue with current plan of care.   FIM:  Comprehension Comprehension Mode: Auditory Comprehension: 5-Understands complex 90% of the time/Cues < 10% of the time Expression Expression Mode: Verbal Expression: 5-Expresses complex 90% of the time/cues < 10% of the time Social Interaction Social Interaction: 5-Interacts appropriately 90% of the time - Needs monitoring or encouragement for participation or interaction. Problem Solving Problem Solving: 5-Solves basic 90% of the time/requires cueing < 10% of the time Memory Memory: 4-Recognizes or recalls 75 - 89% of the time/requires cueing 10 - 24% of the time  Pain Pain Assessment Pain Assessment: No/denies pain  Therapy/Group: Individual Therapy  Tammy Barron, M.A., CCC-SLP [phone]  Tammy Barron 02/09/2014, 4:50 PM

## 2014-02-09 NOTE — Progress Notes (Signed)
Physical Therapy Session Note  Patient Details  Name: Tammy Barron MRN: [id number] Date of Birth: 1941-01-04  Today's Date: 02/09/2014 Time: 1300-1400 Time Calculation (min): 60 min  Short Term Goals: Week 1:  PT Short Term Goal 1 (Week 1): = LTG secondary to short LOS  Skilled Therapeutic Interventions/Progress Updates:   Pt resting in bed; pt unable to recall focus of therapy sessions and episode of dizziness yesterday with SLP when questioned.  Pt reports feeling "dizzy" all the time especially with transitional movements.  Placed visual target "A" on wall to R of bed and then on wall to R of pt as target to fix gaze on during rolling, supine > sit, sit > stand and stand pivot bed > w/c all with supervision-min A and verbal cues to sequence fixing gaze prior to transitioning.  Pt performed w/c mobility for endurance x 150' with supervision.  Performed gait assessment and training first with RW for bilat UE support and stabilization during gait x 50' with min A and focus on use of gaze targeting prior to changes in direction to L and R; with RW pt reports feeling more secure with bilat UE support and demonstrates full bilat step and stride length and continuous gait velocity.  Pt reports that at home she has a 3-point cane.  Performed gait with changes in direction and focus on gaze targeting x 50' with quad cane and min A but pt demonstrated step to gait pattern, decreased gait velocity and decreased balance; required increased cues for sequence and step through gait pattern.  Pt believes she would be open to use of RW at home in order to be safe and mod I.  Continued gaze stabilization training in standing with R semi tandem stance during horizontal head turns but pt unable to maintain gaze on target "A" or unable to maintain head turns.  Returned to sitting and to room.  Pt given gaze stabilization exercise to practice in sitting over the weekend.    Therapy Documentation Precautions:   Precautions Precautions: Fall Precaution Comments: Cerebellar stroke - ataxia; Notify physician if increase in NIHSS score of 4 or more, patient c/o severe headache, develops nausea/vomiting, pulse less than 50 or greater than 120, systolic BP less than 100 or greater than or equal to 180, diastolic BP less than 70 or greater than or equal to 105, respiratory rate greater than 30, temperature greater than 100.5 F Restrictions Weight Bearing Restrictions: No Vital Signs: Therapy Vitals Temp: 97.5 F (36.4 C) Temp src: Oral Pulse Rate: 63 Resp: 18 BP: 172/72 mmHg Patient Position, if appropriate: Sitting Oxygen Therapy SpO2: 95 % O2 Device: None (Room air) Pain: Pain Assessment Pain Assessment: No/denies pain Locomotion : Ambulation Ambulation/Gait Assistance: 4: Min assist   See FIM for current functional status  Therapy/Group: Individual Therapy  [name] Hall 02/09/2014, 3:53 PM

## 2014-02-09 NOTE — Progress Notes (Signed)
Subjective/Complaints: 73 y.o. female with history of HTN, anxiety/depression, tobacco abuse who was admitted on 01/31/14 with two day history of dizziness, N &V progressing to difficulty walking. CT head with extensive right cerebellar edema causing mass effect and effacement on the fourth ventricle and relative hydrocephalus of the lateral and third ventricles and concern of potential mass right cerebellum v/s acute infarct. MRI/MRA brain with acute R-PICA infarct in cerebellum with edema and mass effect on 4th ventricle without hydro and occlusion of R-PICA c/w acute infarct. She was started on hypertonic saline for dema control and ASA 325 mg for secondary stroke prevention. 2D echo with EF 55-65% with no wall abnormalities and grade 1 diastolic dysfunction. Carotid dopplers with 60-79% R- ICA stenosis, R-VA retrograde flow and inability to visualize L-ICA due to dressings. New onset A fib treated with IV Cardizem  Voiding Q1 hr last noc, no dysuria  Review of Systems - Negative except poor sleep  Objective: Vital Signs: Blood pressure 156/88, pulse 68, temperature 97.1 F (36.2 C), temperature source Oral, resp. rate 18, weight 89.2 kg (196 lb 10.4 oz), SpO2 93.00%. No results found. Results for orders placed during the hospital encounter of 02/06/14 (from the past 72 hour(s))  URINALYSIS, ROUTINE W REFLEX MICROSCOPIC     Status: Abnormal   Collection Time    02/06/14  6:37 PM      Result Value Ref Range   Color, Urine YELLOW  YELLOW   APPearance CLEAR  CLEAR   Specific Gravity, Urine 1.011  1.005 - 1.030   pH 7.5  5.0 - 8.0   Glucose, UA NEGATIVE  NEGATIVE mg/dL   Hgb urine dipstick SMALL (*) NEGATIVE   Bilirubin Urine NEGATIVE  NEGATIVE   Ketones, ur NEGATIVE  NEGATIVE mg/dL   Protein, ur NEGATIVE  NEGATIVE mg/dL   Urobilinogen, UA 0.2  0.0 - 1.0 mg/dL   Nitrite NEGATIVE  NEGATIVE   Leukocytes, UA TRACE (*) NEGATIVE  URINE CULTURE     Status: None   Collection Time   02/06/14  6:37 PM      Result Value Ref Range   Specimen Description URINE, CATHETERIZED     Special Requests NONE     Culture  Setup Time       Value: 02/06/2014 19:19     Performed at [facility]       Value: >=100,000 COLONIES/ML     Performed at [facility]   Culture       Value: STAPHYLOCOCCUS SPECIES (COAGULASE NEGATIVE)     Note: RIFAMPIN AND GENTAMICIN SHOULD NOT BE USED AS SINGLE DRUGS FOR TREATMENT OF STAPH INFECTIONS.     Performed at [facility]   Report Status 02/08/2014 FINAL     Organism ID, Bacteria STAPHYLOCOCCUS SPECIES (COAGULASE NEGATIVE)    URINE MICROSCOPIC-ADD ON     Status: Abnormal   Collection Time    02/06/14  6:37 PM      Result Value Ref Range   Squamous Epithelial / LPF FEW (*) RARE   WBC, UA 3-6  <3 WBC/hpf   RBC / HPF 3-6  <3 RBC/hpf   Bacteria, UA FEW (*) RARE  BASIC METABOLIC PANEL     Status: Abnormal   Collection Time    02/06/14  6:56 PM      Result Value Ref Range   Sodium 142  137 - 147 mEq/L   Potassium 3.4 (*) 3.7 - 5.3 mEq/L   Chloride  99  96 - 112 mEq/L   CO2 30  19 - 32 mEq/L   Glucose, Bld 111 (*) 70 - 99 mg/dL   BUN 10  6 - 23 mg/dL   Creatinine, Ser 0.70  0.50 - 1.10 mg/dL   Calcium 9.2  8.4 - 10.5 mg/dL   GFR calc non Af Amer 83 (*) >90 mL/min   GFR calc Af Amer >90  >90 mL/min   Comment: (NOTE)     The eGFR has been calculated using the CKD EPI equation.     This calculation has not been validated in all clinical situations.     eGFR's persistently <90 mL/min signify possible Chronic Kidney     Disease.  CBC WITH DIFFERENTIAL     Status: None   Collection Time    02/07/14  6:40 AM      Result Value Ref Range   WBC 8.5  4.0 - 10.5 K/uL   RBC 4.07  3.87 - 5.11 MIL/uL   Hemoglobin 12.6  12.0 - 15.0 g/dL   HCT 37.3  36.0 - 46.0 %   MCV 91.6  78.0 - 100.0 fL   MCH 31.0  26.0 - 34.0 pg   MCHC 33.8  30.0 - 36.0 g/dL   RDW 13.5  11.5 - 15.5 %   Platelets 174  150 - 400 K/uL    Neutrophils Relative % 71  43 - 77 %   Neutro Abs 6.0  1.7 - 7.7 K/uL   Lymphocytes Relative 19  12 - 46 %   Lymphs Abs 1.6  0.7 - 4.0 K/uL   Monocytes Relative 9  3 - 12 %   Monocytes Absolute 0.8  0.1 - 1.0 K/uL   Eosinophils Relative 1  0 - 5 %   Eosinophils Absolute 0.1  0.0 - 0.7 K/uL   Basophils Relative 0  0 - 1 %   Basophils Absolute 0.0  0.0 - 0.1 K/uL  COMPREHENSIVE METABOLIC PANEL     Status: Abnormal   Collection Time    02/07/14  6:40 AM      Result Value Ref Range   Sodium 143  137 - 147 mEq/L   Potassium 3.3 (*) 3.7 - 5.3 mEq/L   Chloride 99  96 - 112 mEq/L   CO2 32  19 - 32 mEq/L   Glucose, Bld 101 (*) 70 - 99 mg/dL   BUN 10  6 - 23 mg/dL   Creatinine, Ser 0.69  0.50 - 1.10 mg/dL   Calcium 9.1  8.4 - 10.5 mg/dL   Total Protein 6.2  6.0 - 8.3 g/dL   Albumin 2.8 (*) 3.5 - 5.2 g/dL   AST 25  0 - 37 U/L   ALT 30  0 - 35 U/L   Alkaline Phosphatase 49  39 - 117 U/L   Total Bilirubin 0.3  0.3 - 1.2 mg/dL   GFR calc non Af Amer 84 (*) >90 mL/min   GFR calc Af Amer >90  >90 mL/min   Comment: (NOTE)     The eGFR has been calculated using the CKD EPI equation.     This calculation has not been validated in all clinical situations.     eGFR's persistently <90 mL/min signify possible Chronic Kidney     Disease.  URINALYSIS, ROUTINE W REFLEX MICROSCOPIC     Status: None   Collection Time    02/08/14  8:40 PM      Result Value Ref Range  Color, Urine YELLOW  YELLOW   APPearance CLEAR  CLEAR   Specific Gravity, Urine 1.011  1.005 - 1.030   pH 7.0  5.0 - 8.0   Glucose, UA NEGATIVE  NEGATIVE mg/dL   Hgb urine dipstick NEGATIVE  NEGATIVE   Bilirubin Urine NEGATIVE  NEGATIVE   Ketones, ur NEGATIVE  NEGATIVE mg/dL   Protein, ur NEGATIVE  NEGATIVE mg/dL   Urobilinogen, UA 1.0  0.0 - 1.0 mg/dL   Nitrite NEGATIVE  NEGATIVE   Leukocytes, UA NEGATIVE  NEGATIVE   Comment: MICROSCOPIC NOT DONE ON URINES WITH NEGATIVE PROTEIN, BLOOD, LEUKOCYTES, NITRITE, OR GLUCOSE <1000 mg/dL.      HEENT: normal Cardio: RRR and no murmur Resp: CTA B/L and unlabored GI: BS positive and NT,ND Extremity:  Pulses positive and No Edema Skin:   Intact Neuro: Alert/Oriented, Cranial Nerve II-XII normal, Normal Motor and Abnormal FMC Ataxic/ dec FMC Musc/Skel:  Normal GEN NAD   Assessment/Plan: 1. Functional deficits secondary to Right PICA cardioembolic infarct which require 3+ hours per day of interdisciplinary therapy in a comprehensive inpatient rehab setting. Physiatrist is providing close team supervision and 24 hour management of active medical problems listed below. Physiatrist and rehab team continue to assess barriers to discharge/monitor patient progress toward functional and medical goals. FIM: FIM - Bathing Bathing Steps Patient Completed: Chest;Right Arm;Left Arm;Abdomen;Front perineal area;Buttocks;Right upper leg;Left upper leg;Right lower leg (including foot);Left lower leg (including foot) Bathing: 5: Supervision: Safety issues/verbal cues  FIM - Upper Body Dressing/Undressing Upper body dressing/undressing steps patient completed: Thread/unthread right sleeve of pullover shirt/dresss;Thread/unthread left sleeve of pullover shirt/dress;Put head through opening of pull over shirt/dress;Pull shirt over trunk Upper body dressing/undressing: 5: Set-up assist to: Obtain clothing/put away FIM - Lower Body Dressing/Undressing Lower body dressing/undressing steps patient completed: Thread/unthread right underwear leg;Thread/unthread left underwear leg;Pull underwear up/down;Thread/unthread right pants leg;Thread/unthread left pants leg;Pull pants up/down;Don/Doff right sock;Don/Doff left sock Lower body dressing/undressing: 4: Steadying Assist  FIM - Toileting Toileting steps completed by patient: Adjust clothing prior to toileting;Performs perineal hygiene;Adjust clothing after toileting Toileting Assistive Devices: Grab bar or rail for support Toileting: 5: Supervision:  Safety issues/verbal cues  FIM - Radio producer Devices: Grab bars Toilet Transfers: 4-To toilet/BSC: Min A (steadying Pt. > 75%);4-From toilet/BSC: Min A (steadying Pt. > 75%)  FIM - Bed/Chair Transfer Bed/Chair Transfer Assistive Devices: Arm rests (L HHA) Bed/Chair Transfer: 4: Bed > Chair or W/C: Min A (steadying Pt. > 75%);4: Chair or W/C > Bed: Min A (steadying Pt. > 75%)  FIM - Locomotion: Wheelchair Distance: 150 Locomotion: Wheelchair: 4: Travels 150 ft or more: maneuvers on rugs and over door sillls with minimal assistance (Pt.>75%) FIM - Locomotion: Ambulation Locomotion: Ambulation Assistive Devices: Other (comment) (L HHA) Ambulation/Gait Assistance: 4: Min assist Locomotion: Ambulation: 1: Travels less than 50 ft with minimal assistance (Pt.>75%)  Comprehension Comprehension Mode: Auditory Comprehension: 5-Understands basic 90% of the time/requires cueing < 10% of the time  Expression Expression Mode: Verbal Expression: 5-Expresses basic 90% of the time/requires cueing < 10% of the time.  Social Interaction Social Interaction: 5-Interacts appropriately 90% of the time - Needs monitoring or encouragement for participation or interaction.  Problem Solving Problem Solving: 5-Solves basic 90% of the time/requires cueing < 10% of the time  Memory Memory: 4-Recognizes or recalls 75 - 89% of the time/requires cueing 10 - 24% of the time  Medical Problem List and Plan:  Right PICA infarct  1. DVT Prophylaxis/Anticoagulation: Pharmaceutical: Lovenox  2. Pain  Management: Prn tylenol  3. Mood: Provide ego support. LCSW to follow for evaluation and support.  4. Neuropsych: This patient is capable of making decisions on her own behalf.  5. Fluid overload:no ivF.6. Induced Hypernatremia: Resolved.  7. Wheezing: likely has component of hyperactive airway disease v/s COPD. Will add nebs prn to help with symptoms. Patient instructed to use flutter  valve every 2 hours while awake.  8. HTN: Monitor with bid checks. Continue bisoprolol/HCTZ. Cardizem  9. ABLA: recheck CBC in am. Will order stool guaiacs.  10. Constipation: Reports no BM since admission--will set bowel program.  11.  Urinary freq, suspect CVA related UA neg, may be still mobilizing fluid.  Trial imipramine  LOS (Days) 3 A FACE TO FACE EVALUATION WAS PERFORMED  Charlett Blake 02/09/2014, 8:17 AM

## 2014-02-09 NOTE — Progress Notes (Signed)
Occupational Therapy Note  Patient Details  Name: Cleda ClarksMargaret L Meas MRN: [id number] Date of Birth: [birth year] Today's Date: 02/09/2014  Pt refused 60 mins skilled OT treatment session secondary to fatigue from continued poor sleep due to frequent urination.  Pt also appeared down today and asking about a gun and then laughing it off.  Spoke with RN and SWK, requesting Neuro psych consult for coping.   Brand MalesSarah M Caldwell Kronenberger 02/09/2014, 9:16 AM

## 2014-02-10 ENCOUNTER — Inpatient Hospital Stay (HOSPITAL_COMMUNITY): Payer: Medicare Other | Admitting: Occupational Therapy

## 2014-02-10 ENCOUNTER — Inpatient Hospital Stay (HOSPITAL_COMMUNITY): Payer: Medicare Other | Admitting: Speech Pathology

## 2014-02-10 ENCOUNTER — Inpatient Hospital Stay (HOSPITAL_COMMUNITY): Payer: Medicare Other | Admitting: Physical Therapy

## 2014-02-10 DIAGNOSIS — I69993 Ataxia following unspecified cerebrovascular disease: Secondary | ICD-10-CM

## 2014-02-10 NOTE — Progress Notes (Signed)
Physical Therapy Session Note  Patient Details  Name: Tammy ClarksMargaret L Rumpf MRN: [id number] Date of Birth: 1940-12-28  Today's Date: 02/10/2014 Time: 1100-1200 Time Calculation (min): 60 min  Short Term Goals: Week 1:  PT Short Term Goal 1 (Week 1): = LTG secondary to short LOS  Skilled Therapeutic Interventions/Progress Updates:    Gaze stabilization exercises (horizontal only, unable to tolerate vertical today) in sitting, standing (without UE support), and standing on compliant surface without UE support. Glasses on entire session. Pt most limited by gaze moving to double vision after only a few head turns however is able to converge with increased time and cues for visual focusing. Pt reports consistently that Lt eye is intact and Rt eye is the cause for double vision. With ambulation pt able to perform head turns with minimal loss of balance. Pt able to ambulate 2x 30', and 1 x 150' with RW and overall min assist due to head turning and visual scanning challenge.   Pt is very high functioning and will likely benefit from continued gaze stabilization exercises, avoiding visual targeting overall, and holding off on inhibiting vision to promote convergence recovery.   Pt encouraged to stop exercises if dizziness exceeds 5>10 as 2-3/10 is her baseline. Pt and family given handout for seated VOR x 1 exercises.   Therapy Documentation Precautions:  Precautions Precautions: Fall Precaution Comments: Cerebellar stroke - ataxia; Notify physician if increase in NIHSS score of 4 or more, patient c/o severe headache, develops nausea/vomiting, pulse less than 50 or greater than 120, systolic BP less than 100 or greater than or equal to 180, diastolic BP less than 70 or greater than or equal to 105, respiratory rate greater than 30, temperature greater than 100.5 F Restrictions Weight Bearing Restrictions: No Pain:  no c/o pain  See FIM for current functional status  Therapy/Group: Individual  Therapy  Thereasa ParkinHannah C Burl Tauzin 02/10/2014, 12:04 PM

## 2014-02-10 NOTE — Progress Notes (Signed)
Speech Language Pathology Daily Session Note  Patient Details  Name: Tammy Barron MRN: [id number] Date of Birth: January 19, 1941  Today's Date: 02/10/2014 Time: 1330-1400 Time Calculation (min): 30 min  Short Term Goals: Week 1: SLP Short Term Goal 1 (Week 1): Pt will demonstrate selective attention during moderately complex tasks for 15 minutes with Mod I.  SLP Short Term Goal 2 (Week 1): Pt will demonstrate moderately complex problem solving skills for functional acitivities including medicine management, bill paying, etc with moderate verbal, visual cues and 80% accuracy.  SLP Short Term Goal 3 (Week 1): Pt will read multiple paragraph level material and answer questions correctly 75% of opportunities with minimal verbal cues.    Skilled Therapeutic Interventions: Skilled treatment session focused on cognitive goals.  SLP facilitated session by providing Max multimodal cues for organization and problem solving with mildly complex scheduling task, suspect difficulty was due to decreased mental flexibility. Pt perseverative on her "own routine" at home that "she just does without even thinking."  Pt's daughter present throughout the session and reported she felt the patient was at her cognitive baseline and reinforced pt's decreased mental flexibility with multiple tasks/functional conversations throughout the session. Continue with current plan of care.    FIM:  Comprehension Comprehension Mode: Auditory Comprehension: 5-Understands complex 90% of the time/Cues < 10% of the time Expression Expression Mode: Verbal Expression: 5-Expresses basic 90% of the time/requires cueing < 10% of the time. Social Interaction Social Interaction: 5-Interacts appropriately 90% of the time - Needs monitoring or encouragement for participation or interaction. Problem Solving Problem Solving: 5-Solves basic 90% of the time/requires cueing < 10% of the time Memory Memory: 4-Recognizes or recalls 75 - 89% of  the time/requires cueing 10 - 24% of the time  Pain Pain Assessment Pain Assessment: No/denies pain  Therapy/Group: Individual Therapy  Huston FoleyCourtney M Tammy Barron 02/10/2014, 3:20 PM

## 2014-02-10 NOTE — Progress Notes (Signed)
Tammy Barron is a 73 y.o. female February 24, 1941 [id number]  Subjective: Poor sleep due to uncomfortable matress. Otherwise feeling OK.  Objective: Vital signs in last 24 hours: Temp:  [97.3 F (36.3 C)-97.5 F (36.4 C)] 97.3 F (36.3 C) (04/11 0522) Pulse Rate:  [63-64] 64 (04/11 0522) Resp:  [18-19] 19 (04/11 0522) BP: (172-185)/(69-72) 185/69 mmHg (04/11 0522) SpO2:  [93 %-95 %] 93 % (04/11 0522) Weight:  [84 kg (185 lb 3 oz)] 84 kg (185 lb 3 oz) (04/11 0522) Weight change:  Last BM Date: 02/08/14  Intake/Output from previous day: 04/10 0701 - 04/11 0700 In: 640 [P.O.:640] Out: -   Physical Exam General: No apparent distress    Lungs: Normal effort. Lungs clear to auscultation, no crackles or wheezes. Cardiovascular: Regular rate and rhythm, no edema Neurological: No new neurological deficits   Lab Results: BMET    Component Value Date/Time   NA 139 02/09/2014 1220   K 4.0 02/09/2014 1220   CL 99 02/09/2014 1220   CO2 26 02/09/2014 1220   GLUCOSE 88 02/09/2014 1220   BUN 13 02/09/2014 1220   CREATININE 0.67 02/09/2014 1220   CALCIUM 9.5 02/09/2014 1220   GFRNONAA 84* 02/09/2014 1220   GFRAA >90 02/09/2014 1220   CBC    Component Value Date/Time   WBC 8.5 02/07/2014 0640   RBC 4.07 02/07/2014 0640   HGB 12.6 02/07/2014 0640   HCT 37.3 02/07/2014 0640   PLT 174 02/07/2014 0640   MCV 91.6 02/07/2014 0640   MCH 31.0 02/07/2014 0640   MCHC 33.8 02/07/2014 0640   RDW 13.5 02/07/2014 0640   LYMPHSABS 1.6 02/07/2014 0640   MONOABS 0.8 02/07/2014 0640   EOSABS 0.1 02/07/2014 0640   BASOSABS 0.0 02/07/2014 0640   CBG's (last 3):  No results found for this basename: GLUCAP,  in the last 72 hours LFT's Lab Results  Component Value Date   ALT 30 02/07/2014   AST 25 02/07/2014   ALKPHOS 49 02/07/2014   BILITOT 0.3 02/07/2014    Studies/Results: No results found.  Medications:  I have reviewed the patient's current medications. Scheduled Medications: . aspirin  325 mg Oral Daily  .  atorvastatin  10 mg Oral q1800  . bisoprolol-hydrochlorothiazide  1 tablet Oral Daily  . cycloSPORINE  1 drop Both Eyes BID  . diltiazem  120 mg Oral Daily  . enoxaparin (LOVENOX) injection  40 mg Subcutaneous Q24H  . guaiFENesin  600 mg Oral BID  . imipramine  25 mg Oral QHS  . levofloxacin  250 mg Oral Daily  . multivitamin with minerals  1 tablet Oral Daily  . polyethylene glycol  17 g Oral Daily  . potassium chloride  10 mEq Oral BID   PRN Medications: acetaminophen, alum & mag hydroxide-simeth, bisacodyl, clorazepate, diphenhydrAMINE, guaiFENesin-dextromethorphan, ipratropium-albuterol, prochlorperazine, prochlorperazine, prochlorperazine, senna-docusate, traZODone  Assessment/Plan: Principal Problem:   Cerebellar stroke Active Problems:   ANXIETY   HYPERTENSION   Atrial fibrillation   Thrombocytopenia, unspecified Right PICA infarct 01/31/14- continue med mgmt and rehab as outlined and ongoing - no changes recommended  1. DVT Prophylaxis/Anticoagulation: Pharmaceutical: Lovenox  2. Pain Management: Prn tylenol  3. Mood: Provide ego support. LCSW to follow for evaluation and support.  4. Neuropsych: This patient is capable of making decisions on her own behalf.  5. Fluid overload: resolved. 6. Hypertonic IVF induced Hypernatremia: Resolved.  7. Wheezing: suspected component of hyperactive airway disease v/s COPD.  -Added nebs prn to help with symptoms. Patient  to use flutter valve every 2 hours while awake.  8. HTN: Monitor with bid checks. Continue bisoprolol/HCTZ. Cardizem  9. Anemia - last CBC improved . ordered stool guaiacs 4/7 -pending.  10. Constipation: on bowel program.  11. UTI 02/08/14: coag neg staph - on Levaquin. Trial imipramine 12. AFib - new dx this admission -on calcium channel blocker, no anticoag recommended  13. Hyperlipidemia - on atorva     Length of stay, days: 4    Zaylyn Bergdoll A. Felicity Coyer, MD 02/10/2014, 8:14 AM

## 2014-02-10 NOTE — Progress Notes (Signed)
Occupational Therapy Session Notes  Patient Details  Name: Tammy Barron MRN: [id number] Date of Birth: Jun 09, 1941  Today's Date: 02/10/2014 Time: [id number] and 230-320 Time Calculation (min): 45 min and 50 min (missed 10 minutes due to fatigue)  Short Term Goals: Week 1:  OT Short Term Goal 1 (Week 1): Pt will complete walk-in shower transfer at supervision level ambulating with necessary AD OT Short Term Goal 2 (Week 1): Pt will complete toilet transfer at supervision level ambulating with necessary AD OT Short Term Goal 3 (Week 1): Pt will complete LB dressing with supervision OT Short Term Goal 4 (Week 1): Pt will complete simple meal prep with supervision with necessary AD  Skilled Therapeutic Interventions/Progress Updates:  1)  Patient resting in bed upon arrival and visiting with friends.  Engaged in self care retraining to include shower, dress and groom tasks.  Focused session on using the "A" target once sitting EOB to decrease dizziness, dynamic balance, ambulate with RW to and from bathroom, shower seated on bench then stand to bathe her buttocks, dressing seated on toilet seat, stand at sink to brush teeth and sit to put on makeup and dry her hair.  Patient's daughter arrived from out of town and delighted to see how well her mother was doing.  Patient with one slight LOB during turning in the bathroom then held onto the grab bar so she was able to recover without assist.  Patient's daughter confided in this OT that she is concerned that her brother and sister in law are [facility] and daughter wants her mother to feel like she has options.  Encouraged daughter to call SW on Monday to discuss her concerns.  2)  Patient resting in bed upon arrival with daughter at her side.  Engaged in discussion about discharge plan and OT LTGs as patient reports she has always been independent and is desperate to be independent again.  Reviewed this OTs concern  that patient with slow progress partially due to patient's requests to defer some of her past therapy sessions due to fatigue. Patient reports feeling very tired yet wanting to do her best so she engaged in OOB activity to include ambulate to toilet with RW, propelled w/c the distance from her room to therapy gym and this OT pushed her the rest of the way to the therapy apartment.  Patient explored the therapy kitchen using RW then reviewed shower/bathroom set up at home.  Patient appeared very tired and occasionally glazed over when discussing options for bathroom safety.   Daughter to get dimensions and drawing of bathroom set up. Daughter very supportive however unsure daughter realizes patient's cognitive deficits.  Near beginning of session, patient's daughter-in-law came in room for patient to sign some papers then she left to put them in the mail.    Therapy Documentation Precautions:  Precautions Precautions: Fall Precaution Comments: Cerebellar stroke - ataxia; Notify physician if increase in NIHSS score of 4 or more, patient c/o severe headache, develops nausea/vomiting, pulse less than 50 or greater than 120, systolic BP less than 100 or greater than or equal to 180, diastolic BP less than 70 or greater than or equal to 105, respiratory rate greater than 30, temperature greater than 100.5 F Restrictions Weight Bearing Restrictions: No Pain: Denies pain in both sessions ADL: See FIM for current functional status  Therapy/Group: Individual Therapy both sessions  Fredia BeetsChristina K Collyns Mcquigg 02/10/2014, 10:40 AM

## 2014-02-11 ENCOUNTER — Inpatient Hospital Stay (HOSPITAL_COMMUNITY): Payer: Medicare Other | Admitting: *Deleted

## 2014-02-11 NOTE — Progress Notes (Signed)
Occupationall Therapy Note  Patient Details  Name: Tammy Barron MRN: [id number] Date of Birth: 05/07/1941 Today's Date: 02/11/2014  Occupational Therapy Note  Time:  1330- 1415  (45 min) Pain:  3/10 general Individual session    Addressed functional mobility from room to gym.  Showed AE for peric care.  Pt interested in toilet aid for home use.   Pt. Informed that she could get one from [facility] or possibly gift shop.  Pt. Wrote down information.  Propelled wc back to room and left in wc with call bell in place.    Humberto SealsElizabeth J Jakaya Jacobowitz 02/11/2014, 2:18 PM

## 2014-02-11 NOTE — Progress Notes (Signed)
[name] is a 73 y.o. female Nov 30, 1940 [id number]  Subjective:  feeling OK. Encouraged by good night sleep last night. Denies pain or shortness of breath   Objective: Vital signs in last 24 hours: Temp:  [96.8 F (36 C)-98.1 F (36.7 C)] 98.1 F (36.7 C) (04/12 0445) Pulse Rate:  [56-64] 56 (04/12 0445) Resp:  [18] 18 (04/12 0445) BP: (130-143)/(73-76) 130/76 mmHg (04/12 0445) SpO2:  [95 %] 95 % (04/12 0445) Weight:  [83.9 kg (184 lb 15.5 oz)] 83.9 kg (184 lb 15.5 oz) (04/12 0445) Weight change: -0.1 kg (-3.5 oz) Last BM Date: 02/08/14  Intake/Output from previous day: 04/11 0701 - 04/12 0700 In: 240 [P.O.:240] Out: -   Physical Exam General: No apparent distress    Lungs: Normal effort. Lungs clear to auscultation, no crackles or wheezes. Cardiovascular: Regular rate and rhythm, no edema Neurological: No new neurological deficits   Lab Results: BMET    Component Value Date/Time   NA 139 02/09/2014 1220   K 4.0 02/09/2014 1220   CL 99 02/09/2014 1220   CO2 26 02/09/2014 1220   GLUCOSE 88 02/09/2014 1220   BUN 13 02/09/2014 1220   CREATININE 0.67 02/09/2014 1220   CALCIUM 9.5 02/09/2014 1220   GFRNONAA 84* 02/09/2014 1220   GFRAA >90 02/09/2014 1220   CBC    Component Value Date/Time   WBC 8.5 02/07/2014 0640   RBC 4.07 02/07/2014 0640   HGB 12.6 02/07/2014 0640   HCT 37.3 02/07/2014 0640   PLT 174 02/07/2014 0640   MCV 91.6 02/07/2014 0640   MCH 31.0 02/07/2014 0640   MCHC 33.8 02/07/2014 0640   RDW 13.5 02/07/2014 0640   LYMPHSABS 1.6 02/07/2014 0640   MONOABS 0.8 02/07/2014 0640   EOSABS 0.1 02/07/2014 0640   BASOSABS 0.0 02/07/2014 0640   CBG's (last 3):  No results found for this basename: GLUCAP,  in the last 72 hours LFT's Lab Results  Component Value Date   ALT 30 02/07/2014   AST 25 02/07/2014   ALKPHOS 49 02/07/2014   BILITOT 0.3 02/07/2014    Studies/Results: No results found.  Medications:  I have reviewed the patient's current medications. Scheduled  Medications: . aspirin  325 mg Oral Daily  . atorvastatin  10 mg Oral q1800  . bisoprolol-hydrochlorothiazide  1 tablet Oral Daily  . cycloSPORINE  1 drop Both Eyes BID  . diltiazem  120 mg Oral Daily  . enoxaparin (LOVENOX) injection  40 mg Subcutaneous Q24H  . guaiFENesin  600 mg Oral BID  . imipramine  25 mg Oral QHS  . levofloxacin  250 mg Oral Daily  . multivitamin with minerals  1 tablet Oral Daily  . polyethylene glycol  17 g Oral Daily  . potassium chloride  10 mEq Oral BID   PRN Medications: acetaminophen, alum & mag hydroxide-simeth, bisacodyl, clorazepate, diphenhydrAMINE, guaiFENesin-dextromethorphan, ipratropium-albuterol, prochlorperazine, prochlorperazine, prochlorperazine, senna-docusate, traZODone  Assessment/Plan: Principal Problem:   Cerebellar stroke Active Problems:   ANXIETY   HYPERTENSION   Atrial fibrillation   Thrombocytopenia, unspecified Right PICA infarct 01/31/14- continue med mgmt and rehab as outlined and ongoing - no changes recommended  1. DVT Prophylaxis/Anticoagulation: Pharmaceutical: Lovenox  2. Pain Management: Prn tylenol  3. Mood: Provide ego support. LCSW to follow for evaluation and support.  4. Neuropsych: This patient is capable of making decisions on her own behalf.  5. Fluid overload: resolved. 6. Hypertonic IVF induced Hypernatremia: Resolved.  7. Wheezing: suspected component of hyperactive airway disease v/s  COPD.  -Added nebs prn to help with symptoms. Patient to use flutter valve every 2 hours while awake.  8. HTN: Monitor with bid checks. Continue bisoprolol/HCTZ. Cardizem  9. Anemia - last CBC improved . ordered stool guaiacs 4/7 -pending.  10. Constipation: on bowel program.  11. UTI 02/08/14: coag neg staph - on Levaquin. Trial imipramine 12. AFib - new dx this admission -on calcium channel blocker, no anticoag recommended  13. Hyperlipidemia - on atorva     Length of stay, days: 5    Jyl Chico A. Felicity Coyer,  MD 02/11/2014, 9:10 AM

## 2014-02-12 ENCOUNTER — Inpatient Hospital Stay (HOSPITAL_COMMUNITY): Payer: Medicare Other | Admitting: Speech Pathology

## 2014-02-12 ENCOUNTER — Encounter (HOSPITAL_COMMUNITY): Payer: Medicare Other

## 2014-02-12 ENCOUNTER — Inpatient Hospital Stay (HOSPITAL_COMMUNITY): Payer: Medicare Other

## 2014-02-12 ENCOUNTER — Inpatient Hospital Stay (HOSPITAL_COMMUNITY): Payer: Medicare Other | Admitting: Occupational Therapy

## 2014-02-12 ENCOUNTER — Inpatient Hospital Stay (HOSPITAL_COMMUNITY): Payer: Medicare Other | Admitting: Physical Therapy

## 2014-02-12 DIAGNOSIS — I6350 Cerebral infarction due to unspecified occlusion or stenosis of unspecified cerebral artery: Secondary | ICD-10-CM

## 2014-02-12 DIAGNOSIS — I69993 Ataxia following unspecified cerebrovascular disease: Secondary | ICD-10-CM

## 2014-02-12 MED ORDER — IMIPRAMINE HCL 50 MG PO TABS
50.0000 mg | ORAL_TABLET | Freq: Every day | ORAL | Status: DC
  Administered 2014-02-12 – 2014-02-15 (×4): 50 mg via ORAL
  Filled 2014-02-12 (×5): qty 1

## 2014-02-12 NOTE — Progress Notes (Signed)
Subjective/Complaints: 73 y.o. female with history of HTN, anxiety/depression, tobacco abuse who was admitted on 01/31/14 with two day history of dizziness, N &V progressing to difficulty walking. CT head with extensive right cerebellar edema causing mass effect and effacement on the fourth ventricle and relative hydrocephalus of the lateral and third ventricles and concern of potential mass right cerebellum v/s acute infarct. MRI/MRA brain with acute R-PICA infarct in cerebellum with edema and mass effect on 4th ventricle without hydro and occlusion of R-PICA c/w acute infarct. She was started on hypertonic saline for dema control and ASA 325 mg for secondary stroke prevention. 2D echo with EF 55-65% with no wall abnormalities and grade 1 diastolic dysfunction. Carotid dopplers with 60-79% R- ICA stenosis, R-VA retrograde flow and inability to visualize L-ICA due to dressings. New onset A fib treated with IV Cardizem  Voiding Q1.5 hr last noc, no dysuria  Review of Systems - Negative except poor sleep  Objective: Vital Signs: Blood pressure 134/78, pulse 68, temperature 97.9 F (36.6 C), temperature source Oral, resp. rate 18, weight 84.2 kg (185 lb 10 oz), SpO2 97.00%. No results found. Results for orders placed during the hospital encounter of 02/06/14 (from the past 72 hour(s))  BASIC METABOLIC PANEL     Status: Abnormal   Collection Time    02/09/14 12:20 PM      Result Value Ref Range   Sodium 139  137 - 147 mEq/L   Potassium 4.0  3.7 - 5.3 mEq/L   Chloride 99  96 - 112 mEq/L   CO2 26  19 - 32 mEq/L   Glucose, Bld 88  70 - 99 mg/dL   BUN 13  6 - 23 mg/dL   Creatinine, Ser 0.67  0.50 - 1.10 mg/dL   Calcium 9.5  8.4 - 10.5 mg/dL   GFR calc non Af Amer 84 (*) >90 mL/min   GFR calc Af Amer >90  >90 mL/min   Comment: (NOTE)     The eGFR has been calculated using the CKD EPI equation.     This calculation has not been validated in all clinical situations.     eGFR's persistently <90  mL/min signify possible Chronic Kidney     Disease.     HEENT: normal Cardio: RRR and no murmur Resp: CTA B/L and unlabored GI: BS positive and NT,ND Extremity:  Pulses positive and No Edema Skin:   Intact Neuro: Alert/Oriented, Cranial Nerve II-XII normal, Normal Motor and Abnormal FMC Ataxic/ dec FMC Musc/Skel:  Normal GEN NAD   Assessment/Plan: 1. Functional deficits secondary to Right PICA cardioembolic infarct which require 3+ hours per day of interdisciplinary therapy in a comprehensive inpatient rehab setting. Physiatrist is providing close team supervision and 24 hour management of active medical problems listed below. Physiatrist and rehab team continue to assess barriers to discharge/monitor patient progress toward functional and medical goals. FIM: FIM - Bathing Bathing Steps Patient Completed: Chest;Right Arm;Left Arm;Abdomen;Front perineal area;Buttocks;Right upper leg;Left upper leg;Right lower leg (including foot);Left lower leg (including foot) Bathing: 5: Supervision: Safety issues/verbal cues  FIM - Upper Body Dressing/Undressing Upper body dressing/undressing steps patient completed: Thread/unthread right sleeve of pullover shirt/dresss;Thread/unthread left sleeve of pullover shirt/dress;Put head through opening of pull over shirt/dress;Pull shirt over trunk Upper body dressing/undressing: 5: Set-up assist to: Obtain clothing/put away FIM - Lower Body Dressing/Undressing Lower body dressing/undressing steps patient completed: Thread/unthread right pants leg;Thread/unthread left pants leg;Pull pants up/down;Don/Doff right sock;Don/Doff left sock;Don/Doff right shoe;Don/Doff left shoe Lower body dressing/undressing:  5: Set-up assist to: Obtain clothing  FIM - Toileting Toileting steps completed by patient: Adjust clothing prior to toileting;Performs perineal hygiene;Adjust clothing after toileting Toileting Assistive Devices: Grab bar or rail for support Toileting:  5: Supervision: Safety issues/verbal cues  FIM - Radio producer Devices: Grab bars Toilet Transfers: 4-To toilet/BSC: Min A (steadying Pt. > 75%);4-From toilet/BSC: Min A (steadying Pt. > 75%)  FIM - Bed/Chair Transfer Bed/Chair Transfer Assistive Devices: Arm rests;Bed rails Bed/Chair Transfer: 5: Supine > Sit: Supervision (verbal cues/safety issues);4: Bed > Chair or W/C: Min A (steadying Pt. > 75%);4: Chair or W/C > Bed: Min A (steadying Pt. > 75%)  FIM - Locomotion: Wheelchair Distance: 150 Locomotion: Wheelchair: 5: Travels 150 ft or more: maneuvers on rugs and over door sills with supervision, cueing or coaxing FIM - Locomotion: Ambulation Locomotion: Ambulation Assistive Devices: Walker - Rolling;Cane - Quad Ambulation/Gait Assistance: 4: Min assist Locomotion: Ambulation: 2: Travels 50 - 149 ft with minimal assistance (Pt.>75%)  Comprehension Comprehension Mode: Auditory Comprehension: 5-Understands basic 90% of the time/requires cueing < 10% of the time  Expression Expression Mode: Verbal Expression: 5-Expresses basic 90% of the time/requires cueing < 10% of the time.  Social Interaction Social Interaction: 5-Interacts appropriately 90% of the time - Needs monitoring or encouragement for participation or interaction.  Problem Solving Problem Solving: 5-Solves basic 90% of the time/requires cueing < 10% of the time  Memory Memory: 4-Recognizes or recalls 75 - 89% of the time/requires cueing 10 - 24% of the time  Medical Problem List and Plan:  Right PICA infarct  1. DVT Prophylaxis/Anticoagulation: Pharmaceutical: Lovenox  2. Pain Management: Prn tylenol  3. Mood: Provide ego support. LCSW to follow for evaluation and support.  4. Neuropsych: This patient is capable of making decisions on her own behalf.  5. Fluid overload:no ivF.6. Induced Hypernatremia: Resolved.  7. Wheezing: likely has component of hyperactive airway disease v/s COPD.  Will add nebs prn to help with symptoms. Patient instructed to use flutter valve every 2 hours while awake.  8. HTN: Monitor with bid checks. Continue bisoprolol/HCTZ. Cardizem  9. ABLA: recheck CBC in am. Will order stool guaiacs.  10. Constipation: Reports no BM since admission--will set bowel program.  11.  Urinary freq, suspect CVA related UA neg, may be still mobilizing fluid.  increase imipramine  LOS (Days) 6 A FACE TO FACE EVALUATION WAS PERFORMED  Charlett Blake 02/12/2014, 8:38 AM

## 2014-02-12 NOTE — Progress Notes (Signed)
Social Work Patient ID: Cleda ClarksMargaret L Tober, female   DOB: [birth year], 73 y.o.   MRN: [id number]  Late entry for team conference f/u with pt from 02-07-14:  CSW updated pt on team conference discussion and pt's need for supervision initially and she admitted that it would be provided by her dtr, although she worries about them both needing/liking to be in control.  Pt has asked for CSW to call her son/dtr-in-law to discuss d/c plan.  CSW will contact them to discuss d/c plan, as it seems it may have changed, especially since team conference.

## 2014-02-12 NOTE — Progress Notes (Signed)
Social Work Assessment and Plan  Patient Details  Name: Tammy Barron MRN: [id number] Date of Birth: 1941-02-06  Today's Date: 02/12/2014  Problem List:  Patient Active Problem List   Diagnosis Date Noted  . CVA (cerebral infarction) 02/06/2014  . Thrombocytopenia, unspecified 02/06/2014  . Atrial fibrillation 02/05/2014  . Hypernatremia, induced 02/05/2014  . Obesity, unspecified 02/05/2014  . Tobacco use disorder 02/05/2014  . Obstructive hydrocephalus 02/01/2014  . Cerebellar stroke 01/31/2014  . ABDOMINAL PAIN 08/22/2009  . KNEE PAIN, LEFT, CHRONIC 02/21/2008  . HYPERLIPIDEMIA 12/07/2007  . ANXIETY 12/07/2007  . DEPRESSION 12/07/2007  . HYPERTENSION 12/07/2007   Past Medical History:  Past Medical History  Diagnosis Date  . Anxiety   . Depression   . Hypertension   . Hyperlipidemia   . Hx of abnormal Pap smear    Past Surgical History:  Past Surgical History  Procedure Laterality Date  . Abdominal hysterectomy  1982   Social History:  reports that she has been smoking Cigarettes.  She has been smoking about 1.00 pack per day. She has never used smokeless tobacco. She reports that she does not drink alcohol. Her drug history is not on file.  Family / Support Systems Marital Status: Widow/Widower Patient Roles: Parent Children: Barbette Merino - son [phone]; Elane Fritz - dtr-in-law  [phone]; Brayton Mars - dtr [phone] Anticipated Caregiver: self and daughter Ability/Limitations of Caregiver: Dtr to come stay with patient initially.  Dtr lives in [place] and plans to come stay with pt for a week or two. Pt and dtr do not always see eye-to-eye. Caregiver Availability: 24/7 (for a week or two) Family Dynamics: Son and dtr-in-law live in [place], [place].  Dtr is in [place].  Pt wants [place] to communicate with her son, but acknowledges that dtr will be the one to come stay with her initially.  Social History Preferred language:  English Religion: Methodist Education: college Read: Yes Write: Yes Employment Status: Retired Date Retired/Disabled/Unemployed: Pt is a retired Marine scientist. Date unknown. Legal History/Current Legal Issues: None reported Guardian/Conservator: N/A   Abuse/Neglect Physical Abuse: Denies Verbal Abuse: Denies Sexual Abuse: Denies Exploitation of patient/patient's resources: Denies Self-Neglect: Denies  Emotional Status Pt's affect, behavior and adjustment status: Pt is pretty frustrated to be in the hospital and is having difficulty sleeping. Pt has also shown signs of anxiety. Recent Psychosocial Issues: None reported. Pyschiatric History: Pt with anxiety and depression.  Uses medication for this, as needed. Substance Abuse History: None reported  Patient / Family Perceptions, Expectations & Goals Pt/Family understanding of illness & functional limitations: Pt understands her limitations and accepts she may need a little help at d/c. Premorbid pt/family roles/activities: Pt enjoys reading and going places with her friends. Anticipated changes in roles/activities/participation: Pt will need supervision with homemaking tasks initially. Pt/family expectations/goals: Pt just "wants to get home."  [employer]: None Premorbid Home Care/DME Agencies: Other (Comment) (Pt has some equipment at home that was her late husband's. Rolling walker, BSC, shower chair, lift chair.) Transportation available at discharge: family Resource referrals recommended: Neuropsychology;Support group (specify)  Discharge Planning Living Arrangements: Alone Support Systems: Friends/neighbors;Children Type of Residence: Private residence Insurance Resources: Multimedia programmer (specify) (Marine scientist) Financial Resources: Social Security Living Expenses: Mortgage Money Management: Patient Does the patient have any problems obtaining your medications?: No Home Management:  Pt was doing this prior to admission. Patient/Family Preliminary Plans: Pt plans to return home at d/c with her dtr comiing for a week or two to  help her transition back to home post stroke. Barriers to Discharge: Steps;Family Support Social Work Anticipated Follow Up Needs: HH/OP Expected length of stay: 10 to 13 days  Clinical Impression CSW met with pt to introduce self and role of CSW and to complete assessment.  Pt was tired from several poor nights sleep and was frustrated and feeling anxious.  After CSW's visit, CSW informed RN that pt wanted something for anxiety.  Pt spoke with CSW, but did not seem overly eager to talk much.  She became upset when CSW talked about her dtr calling to talk to "Tammy Barron" and got this CSW "Tammy Barron."  Pt went on to talk about how she and Anderson Malta (dtr) both like to "have control" and this causes tension between the two of them.  CSW asked her if this meant she didn't want her to come at her d/c and she said "no."  She feels they will be ok for a week or two at d/c.  Pt's son and dtr-in-law are both physicians and they cannot come stay with her.  Pt's goals are set for modified independent with the exception of homemaking tasks.  CSW to follow up with pt again to make sure this is the plan at d/c.  Silvestre Mesi Jemario Poitras 02/12/2014, 9:27 AM

## 2014-02-12 NOTE — Progress Notes (Signed)
Recreational Therapy Assessment and Plan  Patient Details  Name: Tammy Barron MRN: [id number] Date of Birth: 23-Dec-1940 Today's Date: 02/12/2014  Rehab Potential: Good ELOS: 2 weeks   Assessment Clinical Impression:Problem List:  Patient Active Problem List    Diagnosis  Date Noted   .  CVA (cerebral infarction)  02/06/2014   .  Thrombocytopenia, unspecified  02/06/2014   .  Atrial fibrillation  02/05/2014   .  Hypernatremia, induced  02/05/2014   .  Obesity, unspecified  02/05/2014   .  Tobacco use disorder  02/05/2014   .  Obstructive hydrocephalus  02/01/2014   .  Cerebellar stroke  01/31/2014   .  ABDOMINAL PAIN  08/22/2009   .  KNEE PAIN, LEFT, CHRONIC  02/21/2008   .  HYPERLIPIDEMIA  12/07/2007   .  ANXIETY  12/07/2007   .  DEPRESSION  12/07/2007   .  HYPERTENSION  12/07/2007    Past Medical History:  Past Medical History   Diagnosis  Date   .  Anxiety    .  Depression    .  Hypertension    .  Hyperlipidemia    .  Hx of abnormal Pap smear     Past Surgical History:  Past Surgical History   Procedure  Laterality  Date   .  Abdominal hysterectomy   1982    Assessment & Plan  Clinical Impression: Patient is a 73 y.o. female with history of HTN, anxiety/depression, tobacco abuse who was admitted on 01/31/14 with two day history of dizziness, N &V progressing to difficulty walking. CT head with extensive right cerebellar edema causing mass effect and effacement on the fourth ventricle and relative hydrocephalus of the lateral and third ventricles and concern of potential mass right cerebellum v/s acute infarct. MRI/MRA brain with acute R-PICA infarct in cerebellum with edema and mass effect on 4th ventricle without hydro and occlusion of R-PICA c/w acute infarct. She was started on hypertonic saline for dema control and ASA 325 mg for secondary stroke prevention. 2D echo with EF 55-65% with no wall abnormalities and grade 1 diastolic dysfunction. Carotid dopplers with  60-79% R- ICA stenosis, R-VA retrograde flow and inability to visualize L-ICA due to dressings. New onset A fib treated with IV Cardizem. Follow up CCT with evolution and worsening of posterior mass effect with increasing hydro and no hemorrhagic conversion. Patient stable neurologically and being monitored in ICU. Stroke felt to be cardioembolic. She has had increase in wheezing with BLE edema and was treated with IV diuresis last night and this am. Patient transferred to CIR on 02/06/2014.   Pt presents with decreased activity tolerance, decreased functional mobility, decreased balance, decreased coordination, decreased vision, decreased memory Limiting pt's independence with leisure/community pursuits.   Leisure History/Participation Premorbid leisure interest/current participation: Medical laboratory scientific officer - Building control surveyor - Doctor, hospital - Travel (Comment);Games - Cards Leisure Participation Style: Alone;With Family/Friends Awareness of Community Resources: Good-identify 3 post discharge leisure resources Psychosocial / Spiritual Spiritual Interests: Sells: Cooperative Academic librarian Appropriate for Education?: Yes Recreational Therapy Orientation Orientation -Reviewed with patient: Available activity resources Strengths/Weaknesses Patient Strengths/Abilities: Willingness to participate;Active premorbidly Patient weaknesses: Physical limitations TR Patient demonstrates impairments in the following area(s): Endurance;Motor;Safety  Plan Rec Therapy Plan Is patient appropriate for Therapeutic Recreation?: Yes Rehab Potential: Good Treatment times per week: MIn 1 time per week >20 minutes Estimated Length of Stay: 2 weeks TR Treatment/Interventions: Adaptive equipment instruction;1:1 session;Balance/vestibular training;Functional mobility training;Community reintegration;Cognitive remediation/compensation;Patient/family education;Therapeutic  activities;Recreation/leisure participation  Recommendations for other services: None  Discharge Criteria: Patient will be discharged from TR if patient refuses treatment 3 consecutive times without medical reason.  If treatment goals not met, if there is a change in medical status, if patient makes no progress towards goals or if patient is discharged from hospital.  The above assessment, treatment plan, treatment alternatives and goals were discussed and mutually agreed upon: by patient  Waldon Reining 02/12/2014, 4:22 PM

## 2014-02-12 NOTE — Progress Notes (Signed)
Physical Therapy Session Note  Patient Details  Name: Cleda ClarksMargaret L Lyn MRN: [id number] Date of Birth: October 01, 1941  Today's Date: 02/12/2014 Time: [id number] and [id number] Time Calculation (min): 31 min and 32 min  Short Term Goals: Week 1:  PT Short Term Goal 1 (Week 1): = LTG secondary to short LOS  Skilled Therapeutic Interventions/Progress Updates:   Per PT note from weekend: "Pt is very high functioning and will likely benefit from continued gaze stabilization exercises, avoiding visual targeting overall, and holding off on inhibiting vision to promote convergence recovery. Pt encouraged to stop exercises if dizziness exceeds 5>10 as 2-3/10 is her baseline. Pt and family given handout for seated VOR x 1 exercises."  Pt reports improvement in blurriness and double vision when wearing bi-focal glasses.  Pt agreeable for continued vestibular training this am in the room secondary to not having ADL yet.  No c/o dizziness at rest but did report feeling off balance/leaning laterally after transferring supine > sit EOB with supervision.  Pt unable to rate dizziness but reports feeling like she needs to sit still before transitioning again.  Performed transfer bed > chair stand pivot with heavy reliance on UE support on arm rest for balance.  Once seated performed NMR/vestibular rehabilitation training with seated vertical and horizontal VOR x 1 x 2 reps x 30 seconds each and standing horizontal and vertical VOR x 1 x 30 sec each in standing with min A with pt demonstrating difficulty with coordination of head movement and maintaining the target in focus; pt often switching to tilting head to L and R and demonstrating impaired sustained attention to exercise.  Again, pt unable to rate amount of dizziness during exercise; pt did c/o the letter "A" moving during head movement.  During rest breaks discussed with pt her concerns with going home alone and inability to get groceries if she is unable to drive  secondary to lack of family or friends in the area to assist.  Pt is unsure if her daughter will be able to get FMLA to come stay with her upon D/C.  Offered pt suggestions for meal coverage and discussed concerns with SLP and social work in order to continue to identify possible resources for pt.  Pt left in chair for SLP session.    PM session:  Pt resting at EOB after OT session.  Performed gait with RW x 150' with supervision while discussing leisure activities pt used to enjoy.  Pt reports she enjoyed crafts and card games but recently enjoys just doing nothing.  In gym discussed with pt her high falls risk as indicated by a BERG balance score of 28/56.  Demonstrated and verbalized safe sequence for floor > furniture transfer as well as indications for calling EMS.  Pt reports she doesn't have a cell phone but when she had her stroke she was able to crawl to the phone, pull it down and call EMS.  Pt transitioned to floor with min A but was able to transfer floor > mat with supervision but verbal and visual cues for sequencing.  Pt required extra time and multiple rest breaks secondary to fatigue and dizziness with rolling and transitioning to sitting > quadruped > tall kneeling > half kneeling > stand and sit on mat.  Discussed with pt plan for transporting items (plates, cups, laundry, etc) through the house if she requires bilat UE support on RW for balance/stability.  Pt reports she would like to get a basket for her RW.  Demonstrated  to pt the use of a 4WW with basket and seat and had pt return demonstrate ambulation with 4WW, use of brakes and seat for rest breaks.  Pt did like the ability to carry items and sit on seat for rest but felt it would be hard to negotiate in her house with 4WW.  Will continue to discuss.  Pt left in gym with recreation therapist for evaluation.    Therapy Documentation Precautions:  Precautions Precautions: Fall Precaution Comments: Cerebellar stroke - ataxia; Notify  physician if increase in NIHSS score of 4 or more, patient c/o severe headache, develops nausea/vomiting, pulse less than 50 or greater than 120, systolic BP less than 100 or greater than or equal to 180, diastolic BP less than 70 or greater than or equal to 105, respiratory rate greater than 30, temperature greater than 100.5 F Restrictions Weight Bearing Restrictions: No Vital Signs: Therapy Vitals Pulse Rate: 67 BP: 107/64 mmHg Patient Position, if appropriate: Sitting Oxygen Therapy SpO2: 96 % O2 Device: None (Room air) Pain: Pain Assessment Pain Assessment: No/denies pain  See FIM for current functional status  Therapy/Group: Individual Therapy  [place] Desanctisudra Faucette Hall 02/12/2014, 12:33 PM

## 2014-02-12 NOTE — Patient Care Conference (Deleted)
Inpatient RehabilitationTeam Conference and Plan of Care Update Date: 02/07/2014   Time: 11:15 AM    Patient Name: Tammy Barron      Medical Record Number: [id number]  Date of Birth: Dec 24, 1940 Sex: Female         Room/Bed: 4W10C/4W10C-01 Payor Info: Payor: [employer]vertising copywriter[employer] HEALTHCARE MEDICARE / Plan: AARP MEDICARE COMPLETE / Product Type: *No Product type* /    Admitting Diagnosis: RT CVA  Admit Date/Time:  02/06/2014  3:26 PM Admission Comments: No comment available   Primary Diagnosis:  Cerebellar stroke Principal Problem: Cerebellar stroke  Patient Active Problem List   Diagnosis Date Noted  . CVA (cerebral infarction) 02/06/2014  . Thrombocytopenia, unspecified 02/06/2014  . Atrial fibrillation 02/05/2014  . Hypernatremia, induced 02/05/2014  . Obesity, unspecified 02/05/2014  . Tobacco use disorder 02/05/2014  . Obstructive hydrocephalus 02/01/2014  . Cerebellar stroke 01/31/2014  . ABDOMINAL PAIN 08/22/2009  . KNEE PAIN, LEFT, CHRONIC 02/21/2008  . HYPERLIPIDEMIA 12/07/2007  . ANXIETY 12/07/2007  . DEPRESSION 12/07/2007  . HYPERTENSION 12/07/2007    Expected Discharge Date: Expected Discharge Date: 02/16/14  Team Members Present:       Current Status/Progress Goal Weekly Team Focus  Medical   PICA infarct, mild ataxia  maintain med stability  therapy evals   Bowel/Bladder   Continent of bowel and bladder occasional incontinence of bladder due to urgency. LBM 4/8  Continence of bowel and bladder   Timed toileting every 3-4 hours and PRN   Swallow/Nutrition/ Hydration             ADL's   min-mod asssit overall  mod I basic ADLs, supervision advanced ADLs  attention, problem solving, balance, higher level ADLs   Mobility   mod A  mod I  gaze stabilization, gait, balance   Communication             Safety/Cognition/ Behavioral Observations  Min assist  Supervision-Mod I  increase self-monitoring and correcting   Pain   No c/o pain         Skin   No skin  issues noted          Rehab Goals Patient on target to meet rehab goals: Yes Rehab Goals Revised: none - pt's first conference *See Care Plan and progress notes for long and short-term goals.  Barriers to Discharge: no long term family7 assist    Possible Resolutions to Barriers:  achieve Mod I    Discharge Planning/Teaching Needs:  Pt plans to return home with her dtr coming to stay for a week or two.  Dtr will be able to receive family education.   Team Discussion:  Pt has minimal neurodeficits, but what she has are high level deficits.  Pt does not have swallowing issues.  She uses hand held assist for gait.  Modified independent goals overall with supervision needed for homemaking tasks, such as cooking, etc.  Revisions to Treatment Plan:  None - first conference   Continued Need for Acute Rehabilitation Level of Care: The patient requires daily medical management by a physician with specialized training in physical medicine and rehabilitation for the following conditions: Daily direction of a multidisciplinary physical rehabilitation program to ensure safe treatment while eliciting the highest outcome that is of practical value to the patient.: Yes Daily medical management of patient stability for increased activity during participation in an intensive rehabilitation regime.: Yes Daily analysis of laboratory values and/or radiology reports with any subsequent need for medication adjustment of medical intervention for :  Neurological problems  Vista DeckJennifer Capps Kohl Polinsky 02/12/2014, 9:41 AM

## 2014-02-12 NOTE — Patient Care Conference (Signed)
Inpatient RehabilitationTeam Conference and Plan of Care Update Date: 02/07/2014   Time: 11:15 AM    Patient Name: Tammy Barron      Medical Record Number: [id number]  Date of Birth: April 23, 1941 Sex: Female         Room/Bed: 4W10C/4W10C-01 Payor Info: Payor: Advertising copywriter[employer] HEALTHCARE MEDICARE / Plan: AARP MEDICARE COMPLETE / Product Type: *No Product type* /    Admitting Diagnosis: RT CVA  Admit Date/Time:  02/06/2014  3:26 PM Admission Comments: No comment available   Primary Diagnosis:  Cerebellar stroke Principal Problem: Cerebellar stroke  Patient Active Problem List   Diagnosis Date Noted  . CVA (cerebral infarction) 02/06/2014  . Thrombocytopenia, unspecified 02/06/2014  . Atrial fibrillation 02/05/2014  . Hypernatremia, induced 02/05/2014  . Obesity, unspecified 02/05/2014  . Tobacco use disorder 02/05/2014  . Obstructive hydrocephalus 02/01/2014  . Cerebellar stroke 01/31/2014  . ABDOMINAL PAIN 08/22/2009  . KNEE PAIN, LEFT, CHRONIC 02/21/2008  . HYPERLIPIDEMIA 12/07/2007  . ANXIETY 12/07/2007  . DEPRESSION 12/07/2007  . HYPERTENSION 12/07/2007    Expected Discharge Date: Expected Discharge Date: 02/16/14  Team Members Present: Physician leading conference: Dr. Claudette LawsAndrew Kirsteins Social Worker Present: Staci AcostaJenny Nguyen Butler, LCSW;Lucy LincolnHoyle, KentuckyLCSW Nurse Present: Darnelle BosNastassia Cave, RN PT Present: Edman CircleAudra Hall, PT;Emily Marya AmslerParcell, PT OT Present: Perrin MalteseJames McGuire, Suszanne ConnersT;Sarah Hoxie, OT SLP Present: Fae PippinMelissa Bowie, SLP PPS Coordinator present : Edson SnowballBecky Windsor, Chapman FitchPT;Marie Noel, RN, CRRN     Current Status/Progress Goal Weekly Team Focus  Medical   PICA infarct, mild ataxia  maintain med stability  therapy evals   Bowel/Bladder   Continent of bowel and bladder occasional incontinence of bladder due to urgency. LBM 4/8  Continence of bowel and bladder   Timed toileting every 3-4 hours and PRN   Swallow/Nutrition/ Hydration             ADL's   min-mod asssit overall  mod I basic ADLs,  supervision advanced ADLs  attention, problem solving, balance, higher level ADLs   Mobility   mod A  mod I  gaze stabilization, gait, balance   Communication             Safety/Cognition/ Behavioral Observations  Min assist  Supervision-Mod I  increase self-monitoring and correcting   Pain   No c/o pain         Skin   No skin issues noted          Rehab Goals Patient on target to meet rehab goals: Yes Rehab Goals Revised: none - pt's first conference *See Care Plan and progress notes for long and short-term goals.  Barriers to Discharge: no long term family7 assist    Possible Resolutions to Barriers:  achieve Mod I    Discharge Planning/Teaching Needs:  Pt plans to return home with her dtr coming to stay for a week or two.  Dtr will be able to receive family education.   Team Discussion:  Pt has minimal neurodeficits, but what she has are high level deficits.  Pt does not have swallowing issues.  She uses hand held assist for gait.  Modified independent goals overall with supervision needed for homemaking tasks,such as cooking, etc.  Revisions to Treatment Plan:  None - first conference   Continued Need for Acute Rehabilitation Level of Care: The patient requires daily medical management by a physician with specialized training in physical medicine and rehabilitation for the following conditions: Daily direction of a multidisciplinary physical rehabilitation program to ensure safe treatment while eliciting the highest outcome that  is of practical value to the patient.: Yes Daily medical management of patient stability for increased activity during participation in an intensive rehabilitation regime.: Yes Daily analysis of laboratory values and/or radiology reports with any subsequent need for medication adjustment of medical intervention for : Neurological problems  Vista DeckJennifer Capps Nikolis Berent 02/12/2014, 11:17 AM

## 2014-02-12 NOTE — Progress Notes (Signed)
Pt reported to SLP that she feels shaky and nervous inside which gets worse after taking meds ? Mucinex?  VSS, pt sitting just talking prior to feeling expression. PA notified. Pamelia Hoiteborah B Harvest Deist

## 2014-02-12 NOTE — Progress Notes (Signed)
Inpatient Rehabilitation Center Individual Statement of Services  Patient Name:  Tammy Barron  Date:  02/12/2014  Welcome to the Inpatient Rehabilitation Center.  Our goal is to provide you with an individualized program based on your diagnosis and situation, designed to meet your specific needs.  With this comprehensive rehabilitation program, you will be expected to participate in at least 3 hours of rehabilitation therapies Monday-Friday, with modified therapy programming on the weekends.  Your rehabilitation program will include the following services:  Physical Therapy (PT), Occupational Therapy (OT), Speech Therapy (ST), 24 hour per day rehabilitation nursing, Therapeutic Recreation (TR), Neuropsychology, Case Management (Social Worker), Rehabilitation Medicine, Nutrition Services and Pharmacy Services  Weekly team conferences will be held on Wednesdays to discuss your progress.  Your Social Worker will talk with you frequently to get your input and to update you on team discussions.  Team conferences with you and your family in attendance may also be held.  Expected length of stay: 10 to 13 days  Overall anticipated outcome: Modified Independent  Depending on your progress and recovery, your program may change. Your Social Worker will coordinate services and will keep you informed of any changes. Your Social Worker's name and contact numbers are listed  below.  The following services may also be recommended but are not provided by the Inpatient Rehabilitation Center:   Driving Evaluations  Home Health Rehabiltiation Services  Outpatient Rehabilitation Services  Arrangements will be made to provide these services after discharge if needed.  Arrangements include referral to agencies that provide these services.  Your insurance has been verified to be:  [employer] Medicare Complete Your primary doctor is:  Dr. Eleonore ChiquitoPeter Kwiatkowski  Pertinent information will be shared with  your doctor and your insurance company.  Social Worker:  Staci AcostaJenny Cadell Gabrielson, LCSW  [phone] or [phone]) [phone]  Information discussed with and copy given to patient by: Vista DeckJennifer Capps [name], 02/12/2014, 9:28 AM

## 2014-02-12 NOTE — Progress Notes (Signed)
Occupational Therapy Session Note  Patient Details  Name: Tammy Barron MRN: [id number] Date of Birth: 02-11-1941  Today's Date: 02/12/2014 Time: 1100-1157 Time Calculation (min): 57 min  Short Term Goals: Week 1:  OT Short Term Goal 1 (Week 1): Pt will complete walk-in shower transfer at supervision level ambulating with necessary AD OT Short Term Goal 2 (Week 1): Pt will complete toilet transfer at supervision level ambulating with necessary AD OT Short Term Goal 3 (Week 1): Pt will complete LB dressing with supervision OT Short Term Goal 4 (Week 1): Pt will complete simple meal prep with supervision with necessary AD  Skilled Therapeutic Interventions/Progress Updates:    Engaged in ADL retraining with focus on functional transfers, activity tolerance, and self-care retraining.  Pt reports needing to complete laundry as she is down to last outfit.  Performed transfers with supervision/light min assist due to decreased balance and fearfulness with mobility.  Pt completed bathing at sit > stand level with distant supervision.  Pt with carryover of sitting to dry feet and when donning pants due to decreased balance reactions.  Pt completed grooming tasks at seated level due to reports of fatigue.  Ambulated approx 100 feet X2 to laundry room with RW with distant supervision while therapist carried laundry.  Therapy Documentation Precautions:  Precautions Precautions: Fall Precaution Comments: Cerebellar stroke - ataxia; Notify physician if increase in NIHSS score of 4 or more, patient c/o severe headache, develops nausea/vomiting, pulse less than 50 or greater than 120, systolic BP less than 100 or greater than or equal to 180, diastolic BP less than 70 or greater than or equal to 105, respiratory rate greater than 30, temperature greater than 100.5 F Restrictions Weight Bearing Restrictions: No General:   Vital Signs: Therapy Vitals Pulse Rate: 67 BP: 107/64 mmHg Patient Position,  if appropriate: Sitting Oxygen Therapy SpO2: 96 % O2 Device: None (Room air) Pain: Pain Assessment Pain Assessment: No/denies pain  See FIM for current functional status  Therapy/Group: Individual Therapy  Brand MalesSarah M Venus Ruhe 02/12/2014, 12:07 PM

## 2014-02-12 NOTE — Progress Notes (Signed)
Occupational Therapy Session Note  Patient Details  Name: Tammy ClarksMargaret L Fitzpatrick MRN: [id number] Date of Birth: 1940/11/09  Today's Date: 02/12/2014 Time: [id number] Time Calculation (min): 28 min  Short Term Goals: Week 1:  OT Short Term Goal 1 (Week 1): Pt will complete walk-in shower transfer at supervision level ambulating with necessary AD OT Short Term Goal 2 (Week 1): Pt will complete toilet transfer at supervision level ambulating with necessary AD OT Short Term Goal 3 (Week 1): Pt will complete LB dressing with supervision OT Short Term Goal 4 (Week 1): Pt will complete simple meal prep with supervision with necessary AD  Skilled Therapeutic Interventions/Progress Updates:    Pt seen for 1:1 OT session with focus on dynamic standing balance, safety awareness, and functional mobility during IADL tasks. Pt received supine in bed. Ambulated to bathroom at supervision level using RW. Discussed practicing simple meal prep task with pt to address LTG and pt declining stating "I'm not going to cook because it's not something I enjoy." Pt seemed to have depressive mood and began discussing how she feels she will never regain independence since she cannot drive. Discussed her goals for therapy at this time, as well as her needs. She reports wanting to have hired care assist with home management tasks. Deferred to to CSW and spoke with CSW following therapy session. Pt was agreeable to complete laundry task she began this AM. Pt ambulated approx 100 feet to laundry room at supervision level using RW. Pt retrieved clothing for dryer then folded laundry while in standing with no LOB. Pt placed clothing in bag and safely transported back to room with min cues for technique when holding bag with RW. At end of session pt left sitting EOB waiting for PT and all needs in reach.   Therapy Documentation Precautions:  Precautions Precautions: Fall Precaution Comments: Cerebellar stroke - ataxia; Notify physician  if increase in NIHSS score of 4 or more, patient c/o severe headache, develops nausea/vomiting, pulse less than 50 or greater than 120, systolic BP less than 100 or greater than or equal to 180, diastolic BP less than 70 or greater than or equal to 105, respiratory rate greater than 30, temperature greater than 100.5 F Restrictions Weight Bearing Restrictions: No General:   Vital Signs:   Pain: No report of pain during therapy session.   See FIM for current functional status  Therapy/Group: Individual Therapy  Rusell Meneely N Broedy Osbourne 02/12/2014, 2:31 PM

## 2014-02-12 NOTE — Progress Notes (Signed)
Speech Language Pathology Daily Session Note  Patient Details  Name: Tammy Barron MRN: [id number] Date of Birth: 1941-02-25  Today's Date: 02/12/2014 Time: 1005-1045 Time Calculation (min): 40 min  Short Term Goals: Week 1: SLP Short Term Goal 1 (Week 1): Pt will demonstrate selective attention during moderately complex tasks for 15 minutes with Mod I.  SLP Short Term Goal 2 (Week 1): Pt will demonstrate moderately complex problem solving skills for functional acitivities including medicine management, bill paying, etc with moderate verbal, visual cues and 80% accuracy.  SLP Short Term Goal 3 (Week 1): Pt will read multiple paragraph level material and answer questions correctly 75% of opportunities with minimal verbal cues.    Skilled Therapeutic Interventions: Skilled treatment session focused on cognitive goals.  SLP facilitated session by providing Min question cues for mental flexibility to come up with solutions to obstacles related to dischrage planning during a verbal, complex problem solving task.  Continue with current plan of care.    FIM:  Comprehension Comprehension Mode: Auditory Comprehension: 5-Understands complex 90% of the time/Cues < 10% of the time Expression Expression Mode: Verbal Expression: 5-Expresses complex 90% of the time/cues < 10% of the time Social Interaction Social Interaction: 5-Interacts appropriately 90% of the time - Needs monitoring or encouragement for participation or interaction. Problem Solving Problem Solving: 4-Solves basic 75 - 89% of the time/requires cueing 10 - 24% of the time Memory Memory: 5-Requires cues to use assistive device FIM - Eating Eating Activity: 6: More than reasonable amount of time  Pain Pain Assessment Pain Assessment: No/denies pain  Therapy/Group: Individual Therapy  Tammy Barron, M.A., Tammy Barron  Tammy Barron 02/12/2014, 11:24 AM

## 2014-02-13 ENCOUNTER — Inpatient Hospital Stay (HOSPITAL_COMMUNITY): Payer: Medicare Other | Admitting: Occupational Therapy

## 2014-02-13 ENCOUNTER — Inpatient Hospital Stay (HOSPITAL_COMMUNITY): Payer: Medicare Other | Admitting: *Deleted

## 2014-02-13 ENCOUNTER — Inpatient Hospital Stay (HOSPITAL_COMMUNITY): Payer: Medicare Other

## 2014-02-13 ENCOUNTER — Inpatient Hospital Stay (HOSPITAL_COMMUNITY): Payer: Medicare Other | Admitting: Speech Pathology

## 2014-02-13 ENCOUNTER — Inpatient Hospital Stay (HOSPITAL_COMMUNITY): Payer: Medicare Other | Admitting: Physical Therapy

## 2014-02-13 DIAGNOSIS — I6350 Cerebral infarction due to unspecified occlusion or stenosis of unspecified cerebral artery: Secondary | ICD-10-CM

## 2014-02-13 DIAGNOSIS — I69993 Ataxia following unspecified cerebrovascular disease: Secondary | ICD-10-CM

## 2014-02-13 NOTE — Progress Notes (Signed)
Recreational Therapy Session Note  Patient Details  Name: Cleda ClarksMargaret L Shilling MRN: [id number] Date of Birth: [birth year] Today's Date: 02/13/2014  Pain:  No c/o Skilled Therapeutic Interventions/Progress Updates: Session focused discharge planning.  Pt stated "feeling jumpy" & requesting medicine for nerves.  RN made aware & addressing.    Therapy/Group: Individual Therapy   Clois DupesLisa B Franny Selvage 02/13/2014, 4:14 PM

## 2014-02-13 NOTE — Consult Note (Signed)
NEUROBEHAVIORAL STATUS EXAM - CONFIDENTIAL  Inpatient Rehabilitation   MEDICAL NECESSITY:  Tammy Barron was [facility] for a neurobehavioral status exam owing to the patient's diagnosis of stroke, and to assist in treatment planning during admission.   According to medical records, Tammy Barron was admitted to the rehab unit owing to "Functional deficits secondary to right PICA infarct." She has a reported history of HTN, anxiety/depression, and tobacco abuse. She was admitted on 01/31/14 with a two day history of dizziness, nausea and vomiting and difficulty walking. Head CT scan reportedly revealed "extensive right cerebellar edema causing mass effect and effacement on the fourth ventricle and relative hydrocephalus of the lateral and third ventricles and concern of potential mass right cerebellum v/s acute infarct." MRI/MRA brain revealed an acute R-PICA infarct in cerebellum with edema and mass effect on 4th ventricle without hydro and occlusion of R-PICA c/w acute infarct. Stroke was felt to be cardioembolic. She exhibited ataxic gait and rehab was recommended.   During today's appointment, Tammy Barron reported suffering from memory loss for recent information, decreased attention/concentration, and slowed (inefficient) thinking. She was uncertain whether this is due to normal aging or possible stroke-related consequences. She denied experiencing any major motor deficits, or language changes. Her major symptoms are extreme dizziness, nausea/vomiting, and blurred vision.   From an emotional standpoint, Tammy Barron has been treated for depression and anxiety for several years. She described her current mood as "helpless and angry." She feels more easily irritable and agitated at times. She is treated for mood via an anxiolytic and trazadone which she finds to be helpful but she is discouraged when she asks for the anti-anxiety drug because  the staff seems to want to limit when/how she takes it. Tammy Barron sought counseling 20 years ago and did not find it helpful.   Socially, Tammy Barron lives alone and has few reliable sources of support for when she discharges. She recently found out that none her children are going to be able to get off work to help her with the transition home. She hopes to get back to functioning independently like she was prior to the stroke. She was particularly concerned about her ability to drive. Fortunately, while she initially resented therapy here, she has come to realize its importance and feels that she is making gains. She described the staff on the rehab unit as "good."    PROCEDURES ADMINISTERED: [2 units 96116 on 02/12/14] Diagnostic clinical interview  Review of available records Mental Status Exam-2 (brief version)  MENTAL STATUS: Tammy Barron' mental status exam score of 14/16 is above the cutoff used to indicate severe cognitive impairment or dementia. She was fully oriented to person, place, time, and situation. However, while she was able to immediately register 3 words into memory, she could only freely recall one of them after a very short delay.   Emotional & Behavioral Evaluation: Tammy Barron was appropriately dressed for season and situation, and she appeared tidy and well-groomed. Normal posture was noted. She was friendly and rapport was easily established. Her speech was as expected and she was able to express ideas effectively. She seemed to understand test directions readily. Her affect was appropriately modulated. Attention and motivation were good. Optimal test taking conditions were maintained.  From an emotional standpoint, Tammy Barron admitted to suffering from at least a mild degree of depression and anxiety. She seems to be adjusting well to this hospital stay. Suicidal/homicidal ideation, plan or  intent was denied. No manic or hypomanic episodes were reported. The patient denied  ever experiencing any auditory/visual hallucinations. No major behavioral or personality changes were endorsed.    Overall, Tammy Barron endorsed experiencing a good deal of cognitive difficulties and her mental status exam exhibited memory retrieval issues. As such, she will be scheduled for a more comprehensive neuropsychological screen this Thursday prior to discharge. This can more intimately answer some of the patent's questions about driving and living independently, as well as determine whether a full neuropsychological evaluation is warranted. I also plan to inform her social worker that she now has limited resources to help her transition home so that we can come up with the appropriate plan of assistance. Lastly, a great deal of stroke recovery education was provided.    RECOMMENDATIONS  Recommendations for treatment team:    Since emotional factors are adversely impacting the patient's daily life, continued follow-up regarding her psychopharmacological medications. Brief supportive psychotherapy would also be beneficial but she is discharging at the end of the week.    Complete a thorough neuropsychological screen with Dr. Wylene SimmerPollard this Thursday.      Debbe MountsAdam T. Jozef Eisenbeis, Psy.D.  Clinical Neuropsychologist

## 2014-02-13 NOTE — Progress Notes (Addendum)
Speech Language Pathology Discharge Summary  Patient Details  Name: Tammy Barron MRN: [id number] Date of Birth: 10-18-1941  Today's Date: 02/13/2014 Time: 9518-8416 Time Calculation (min): 45 min  Skilled Therapeutic Interventions: Skilled treatment session focused on addressing cognitive goals.  SLP facilitated session with increased wait time to problem solve complex medication management task.  Patient asked appropriate questions to prepare for discharge and demonstrated adequate anticipatory awareness; as a result, no further skilled SLP services are warranted at this time.  Recommend discharge from services.  Patient has met 3 of 3 long term goals.  Patient to discharge at overall Modified Independent; Intermittent Supervision level with complex tasks.  Reasons goals not met: n/a   Clinical Impression/Discharge Summary: Patient met 3 out of 3 long term goals during CIR stay due to increased ability to utilize compensatory strategies during completion of high level problem solving tasks such as medication management and discharge planning.  Patient's pre-existing anxiety appears to at times impact her mental flexibility; however, per family report that is patient's baseline function.  Patient is currently Mod I; however would benefit from intermittent supervision/support for management of these complex tasks following discharge.  Patient and family aware.   Care Partner:  Caregiver Able to Provide Assistance: Other (comment) (n/a)    Recommendation:  Intermittent Supervision with complex tasks  Equipment: none   Reasons for discharge: Treatment goals met   Patient/Family Agrees with Progress Made and Goals Achieved: Yes   See FIM for current functional status  Carmelia Roller., CCC-SLP [place] 02/13/2014, 4:37 PM

## 2014-02-13 NOTE — Progress Notes (Signed)
Subjective/Complaints: 73 y.o. female with history of HTN, anxiety/depression, tobacco abuse who was admitted on 01/31/14 with two day history of dizziness, N &V progressing to difficulty walking. CT head with extensive right cerebellar edema causing mass effect and effacement on the fourth ventricle and relative hydrocephalus of the lateral and third ventricles and concern of potential mass right cerebellum v/s acute infarct. MRI/MRA brain with acute R-PICA infarct in cerebellum with edema and mass effect on 4th ventricle without hydro and occlusion of R-PICA c/w acute infarct. She was started on hypertonic saline for dema control and ASA 325 mg for secondary stroke prevention. 2D echo with EF 55-65% with no wall abnormalities and grade 1 diastolic dysfunction. Carotid dopplers with 60-79% R- ICA stenosis, R-VA retrograde flow and inability to visualize L-ICA due to dressings. New onset A fib treated with IV Cardizem  Voiding Q3-4 hr last noc, no dysuria, feels drowsy this am  Review of Systems - Negative except poor sleep  Objective: Vital Signs: Blood pressure 132/71, pulse 68, temperature 98.3 F (36.8 C), temperature source Oral, resp. rate 17, weight 84 kg (185 lb 3 oz), SpO2 97.00%. No results found. No results found for this or any previous visit (from the past 72 hour(s)).   HEENT: normal Cardio: RRR and no murmur Resp: CTA B/L and unlabored GI: BS positive and NT,ND Extremity:  Pulses positive and No Edema Skin:   Intact Neuro: Alert/Oriented, Cranial Nerve II-XII normal, Normal Motor and Abnormal FMC Ataxic/ dec FMC Musc/Skel:  Normal GEN NAD   Assessment/Plan: 1. Functional deficits secondary to Right PICA cardioembolic infarct which require 3+ hours per day of interdisciplinary therapy in a comprehensive inpatient rehab setting. Physiatrist is providing close team supervision and 24 hour management of active medical problems listed below. Physiatrist and rehab team  continue to assess barriers to discharge/monitor patient progress toward functional and medical goals. FIM: FIM - Bathing Bathing Steps Patient Completed: Chest;Right Arm;Left Arm;Abdomen;Front perineal area;Buttocks;Right upper leg;Left upper leg;Right lower leg (including foot);Left lower leg (including foot) Bathing: 5: Supervision: Safety issues/verbal cues  FIM - Upper Body Dressing/Undressing Upper body dressing/undressing steps patient completed: Thread/unthread right sleeve of pullover shirt/dresss;Thread/unthread left sleeve of pullover shirt/dress;Put head through opening of pull over shirt/dress;Pull shirt over trunk Upper body dressing/undressing: 5: Set-up assist to: Obtain clothing/put away FIM - Lower Body Dressing/Undressing Lower body dressing/undressing steps patient completed: Thread/unthread right pants leg;Thread/unthread left pants leg;Pull pants up/down;Don/Doff right sock;Don/Doff left sock;Don/Doff right shoe;Don/Doff left shoe;Thread/unthread right underwear leg;Thread/unthread left underwear leg;Pull underwear up/down Lower body dressing/undressing: 5: Set-up assist to: Obtain clothing  FIM - Toileting Toileting steps completed by patient: Adjust clothing prior to toileting;Performs perineal hygiene;Adjust clothing after toileting Toileting Assistive Devices: Grab bar or rail for support Toileting: 5: Supervision: Safety issues/verbal cues  FIM - Diplomatic Services operational officerToilet Transfers Toilet Transfers Assistive Devices: Grab bars;Walker Toilet Transfers: 5-From toilet/BSC: Supervision (verbal cues/safety issues);5-To toilet/BSC: Supervision (verbal cues/safety issues)  FIM - BankerBed/Chair Transfer Bed/Chair Transfer Assistive Devices: Bed rails Bed/Chair Transfer: 5: Supine > Sit: Supervision (verbal cues/safety issues);5: Bed > Chair or W/C: Supervision (verbal cues/safety issues);5: Chair or W/C > Bed: Supervision (verbal cues/safety issues)  FIM - Locomotion: Wheelchair Distance:  150 Locomotion: Wheelchair: 5: Travels 150 ft or more: maneuvers on rugs and over door sills with supervision, cueing or coaxing FIM - Locomotion: Ambulation Locomotion: Ambulation Assistive Devices: Walker - Rolling;Cane - Quad Ambulation/Gait Assistance: 4: Min assist Locomotion: Ambulation: 2: Travels 50 - 149 ft with minimal assistance (Pt.>75%)  Comprehension Comprehension  Mode: Auditory Comprehension: 5-Understands basic 90% of the time/requires cueing < 10% of the time  Expression Expression Mode: Verbal Expression: 5-Expresses complex 90% of the time/cues < 10% of the time  Social Interaction Social Interaction: 5-Interacts appropriately 90% of the time - Needs monitoring or encouragement for participation or interaction.  Problem Solving Problem Solving: 4-Solves basic 75 - 89% of the time/requires cueing 10 - 24% of the time  Memory Memory: 5-Requires cues to use assistive device  Medical Problem List and Plan:  Right PICA infarct  1. DVT Prophylaxis/Anticoagulation: Pharmaceutical: Lovenox  2. Pain Management: Prn tylenol  3. Mood: Provide ego support. LCSW to follow for evaluation and support.  4. Neuropsych: This patient is capable of making decisions on her own behalf.  5. Fluid overload:no ivF.6. Induced Hypernatremia: Resolved.  7. Wheezing: likely has component of hyperactive airway disease v/s COPD. Will add nebs prn to help with symptoms. Patient instructed to use flutter valve every 2 hours while awake.  8. HTN: Monitor with bid checks. Continue bisoprolol/HCTZ. Cardizem  9. ABLA: recheck CBC in am. Will order stool guaiacs.  10. Constipation: Reports no BM since admission--will set bowel program.  11.  Urinary freq, suspect CVA related UA neg, may be still mobilizing fluid.  increase imipramine  LOS (Days) 7 A FACE TO FACE EVALUATION WAS PERFORMED  Erick Colacendrew E Kirsteins 02/13/2014, 8:09 AM

## 2014-02-13 NOTE — Progress Notes (Signed)
Occupational Therapy Session Note  Patient Details  Name: Tammy ClarksMargaret L Chernick MRN: [id number] Date of Birth: 02-19-1941  Today's Date: 02/13/2014 Time: 1030-1128 Time Calculation (min): 58 min  Short Term Goals: Week 1:  OT Short Term Goal 1 (Week 1): Pt will complete walk-in shower transfer at supervision level ambulating with necessary AD OT Short Term Goal 2 (Week 1): Pt will complete toilet transfer at supervision level ambulating with necessary AD OT Short Term Goal 3 (Week 1): Pt will complete LB dressing with supervision OT Short Term Goal 4 (Week 1): Pt will complete simple meal prep with supervision with necessary AD  Skilled Therapeutic Interventions/Progress Updates:    Engaged in ADL retraining with focus on functional mobility, safety, and increased participation in self-care tasks.  Encouraged pt to gather all clothing and supplies with RW prior to bathing (pt prefers to use w/c).  Pt distant supervision with all mobility this session.  Completed toilet transfer and toileting, doffed clothing on toilet prior to bathing at sit > stand level in room shower.  Pt mod I with bathing this session.  Pt with questions regarding activity tolerance, discussed energy conservation strategies, ie rest breaks between tasks, chair in bathroom for grooming tasks, etc.  Grooming completed in sitting due to fatigue.  Pt returned to bed at end of session for rest prior to PM sessions.  Therapy Documentation Precautions:  Precautions Precautions: Fall Precaution Comments: Cerebellar stroke - ataxia; Notify physician if increase in NIHSS score of 4 or more, patient c/o severe headache, develops nausea/vomiting, pulse less than 50 or greater than 120, systolic BP less than 100 or greater than or equal to 180, diastolic BP less than 70 or greater than or equal to 105, respiratory rate greater than 30, temperature greater than 100.5 F Restrictions Weight Bearing Restrictions: No Pain: Pain  Assessment Pain Assessment: No/denies pain  See FIM for current functional status  Therapy/Group: Individual Therapy  Dillard EssexSarah M Crysten Kaman 02/13/2014, 11:57 AM

## 2014-02-13 NOTE — Progress Notes (Signed)
Physical Therapy Session Note  Patient Details  Name: Tammy Barron MRN: [id number] Date of Birth: 25-Feb-1941  Today's Date: 02/13/2014 Time: [id number] Time Calculation (min): 58 min  Short Term Goals: Week 1:  PT Short Term Goal 1 (Week 1): = LTG secondary to short LOS  Skilled Therapeutic Interventions/Progress Updates:   Pt reports feeling very groggy/lethargic from two medications given last night but willing to participate.  Performed bed mobility mod I and donned clothing and shoes EOB supervision.   Pt transferred to toilet with RW and performed all toileting tasks and stood at the sink for hand and oral hygiene with supervision.  Pt reporting that despite having a seat and basket for transporting items pt would like to order a RW for home mobility.  She feels it will fit best in her house and will be easier to transport in community.  Performed gait training with RW x 300' x 2 over tile and thick carpet to simulate carpet at home with supervision and no episodes of imbalance during head turns and visual scanning.  Performed simulated car transfer training with RW with pt sequencing full transfer mod I.  Performed stair negotiation training up/down 4 tall stairs (6.5") with L rail only with pt performing step to sequencing with supervision.  While returning to room pt required seated rest break at elevators.  During rest break discussed D/C planning with pt and social worker regarding equipment needs and f/u therapy recommendations.  [facility] since pt would be unable to get transportation to an outpatient clinic.  Also discussed with pt making her MOD I in the room on Thursday and [facility] beside her bed at night to simulate set up for night time toileting at home (secondary to frequency and urgency); alerted OT to plan.  Pt returned to room and to w/c to await SLP.   Therapy Documentation Precautions:   Precautions Precautions: Fall Precaution Comments: Cerebellar stroke - ataxia; Notify physician if increase in NIHSS score of 4 or more, patient c/o severe headache, develops nausea/vomiting, pulse less than 50 or greater than 120, systolic BP less than 100 or greater than or equal to 180, diastolic BP less than 70 or greater than or equal to 105, respiratory rate greater than 30, temperature greater than 100.5 F Restrictions Weight Bearing Restrictions: No Pain: Pain Assessment Pain Assessment: No/denies pain Locomotion : Ambulation Ambulation/Gait Assistance: 5: Supervision   See FIM for current functional status  Therapy/Group: Individual Therapy  Bristol Desanctisudra Barron Hall 02/13/2014, 10:58 AM

## 2014-02-13 NOTE — Progress Notes (Signed)
Occupational Therapy Session Note  Patient Details  Name: Tammy Barron MRN: [id number] Date of Birth: 1941/02/15  Today's Date: 02/13/2014 Time: 1400-1430 Time Calculation (min): 30 min  Short Term Goals: Week 1:  OT Short Term Goal 1 (Week 1): Pt will complete walk-in shower transfer at supervision level ambulating with necessary AD OT Short Term Goal 2 (Week 1): Pt will complete toilet transfer at supervision level ambulating with necessary AD OT Short Term Goal 3 (Week 1): Pt will complete LB dressing with supervision OT Short Term Goal 4 (Week 1): Pt will complete simple meal prep with supervision with necessary AD  Skilled Therapeutic Interventions/Progress Updates:    Pt seen for 1:1 OT session with focus on standing balance, problem solving, safety awareness, and functional mobility during ADL/IADL tasks. Pt received supine in bed and agreeable to therapy session. Ambulated to bathroom at distant supervision level using RW to complete toilet task. Discussed pt's goals at this time and tasks she plans on completing at home. Practiced microwave meal prep task at supervision level with min cues provided for educating on transferring items from one counter to another and counter>table. Pt demonstrated good standing balance when reaching into overhead cabinets and out of BOS. At end of session pt returned to room and left with all needs in reach.   Therapy Documentation Precautions:  Precautions Precautions: Fall Precaution Comments: Cerebellar stroke - ataxia; Notify physician if increase in NIHSS score of 4 or more, patient c/o severe headache, develops nausea/vomiting, pulse less than 50 or greater than 120, systolic BP less than 100 or greater than or equal to 180, diastolic BP less than 70 or greater than or equal to 105, respiratory rate greater than 30, temperature greater than 100.5 F Restrictions Weight Bearing Restrictions: No General:   Vital Signs:   Pain: No report of  pain during therapy session.   See FIM for current functional status  Therapy/Group: Individual Therapy  [name] N Fernando Torry 02/13/2014, 2:45 PM

## 2014-02-14 ENCOUNTER — Inpatient Hospital Stay (HOSPITAL_COMMUNITY): Payer: Medicare Other | Admitting: *Deleted

## 2014-02-14 ENCOUNTER — Inpatient Hospital Stay (HOSPITAL_COMMUNITY): Payer: Medicare Other | Admitting: Occupational Therapy

## 2014-02-14 ENCOUNTER — Inpatient Hospital Stay (HOSPITAL_COMMUNITY): Payer: Medicare Other | Admitting: Physical Therapy

## 2014-02-14 DIAGNOSIS — N390 Urinary tract infection, site not specified: Secondary | ICD-10-CM | POA: Diagnosis present

## 2014-02-14 DIAGNOSIS — I69993 Ataxia following unspecified cerebrovascular disease: Secondary | ICD-10-CM

## 2014-02-14 DIAGNOSIS — I6350 Cerebral infarction due to unspecified occlusion or stenosis of unspecified cerebral artery: Secondary | ICD-10-CM

## 2014-02-14 DIAGNOSIS — R350 Frequency of micturition: Secondary | ICD-10-CM | POA: Diagnosis present

## 2014-02-14 NOTE — Progress Notes (Signed)
Subjective/Complaints: 73 y.o. female with history of HTN, anxiety/depression, tobacco abuse who was admitted on 01/31/14 with two day history of dizziness, N &V progressing to difficulty walking. CT head with extensive right cerebellar edema causing mass effect and effacement on the fourth ventricle and relative hydrocephalus of the lateral and third ventricles and concern of potential mass right cerebellum v/s acute infarct. MRI/MRA brain with acute R-PICA infarct in cerebellum with edema and mass effect on 4th ventricle without hydro and occlusion of R-PICA c/w acute infarct. She was started on hypertonic saline for dema control and ASA 325 mg for secondary stroke prevention. 2D echo with EF 55-65% with no wall abnormalities and grade 1 diastolic dysfunction. Carotid dopplers with 60-79% R- ICA stenosis, R-VA retrograde flow and inability to visualize L-ICA due to dressings. New onset A fib treated with IV Cardizem  No voids between midnight and 6am! Discussed Neuropsych eval  Review of Systems - Negative except Left shoulder pain "flared up" chronic problem  Objective: Vital Signs: Blood pressure 159/78, pulse 71, temperature 97.8 F (36.6 C), temperature source Oral, resp. rate 16, weight 84.414 kg (186 lb 1.6 oz), SpO2 95.00%. No results found. No results found for this or any previous visit (from the past 72 hour(s)).   HEENT: normal Cardio: RRR and no murmur Resp: CTA B/L and unlabored GI: BS positive and NT,ND Extremity:  Pulses positive and No Edema Skin:   Intact Neuro: Alert/Oriented, Cranial Nerve II-XII normal, Normal Motor and Abnormal FMC Ataxic/ dec FMC Musc/Skel:  Left shoulder positive impingement sign pain with adduction and abduction GEN NAD   Assessment/Plan: 1. Functional deficits secondary to Right PICA cardioembolic infarct which require 3+ hours per day of interdisciplinary therapy in a comprehensive inpatient rehab setting. Physiatrist is providing close  team supervision and 24 hour management of active medical problems listed below. Physiatrist and rehab team continue to assess barriers to discharge/monitor patient progress toward functional and medical goals. FIM: FIM - Bathing Bathing Steps Patient Completed: Chest;Right Arm;Left Arm;Abdomen;Front perineal area;Buttocks;Right upper leg;Left upper leg;Right lower leg (including foot);Left lower leg (including foot) Bathing: 6: More than reasonable amount of time  FIM - Upper Body Dressing/Undressing Upper body dressing/undressing steps patient completed: Thread/unthread right sleeve of pullover shirt/dresss;Thread/unthread left sleeve of pullover shirt/dress;Put head through opening of pull over shirt/dress;Pull shirt over trunk Upper body dressing/undressing: 6: More than reasonable amount of time FIM - Lower Body Dressing/Undressing Lower body dressing/undressing steps patient completed: Thread/unthread right pants leg;Thread/unthread left pants leg;Pull pants up/down;Don/Doff right sock;Don/Doff left sock;Don/Doff right shoe;Don/Doff left shoe;Thread/unthread right underwear leg;Thread/unthread left underwear leg;Pull underwear up/down Lower body dressing/undressing: 5: Supervision: Safety issues/verbal cues  FIM - Toileting Toileting steps completed by patient: Adjust clothing prior to toileting;Performs perineal hygiene;Adjust clothing after toileting Toileting Assistive Devices: Grab bar or rail for support Toileting: 5: Supervision: Safety issues/verbal cues  FIM - ArchivistToilet Transfers Toilet Transfers Assistive Devices: Art gallery managerWalker Toilet Transfers: 5-From toilet/BSC: Supervision (verbal cues/safety issues);5-To toilet/BSC: Supervision (verbal cues/safety issues)  FIM - BankerBed/Chair Transfer Bed/Chair Transfer Assistive Devices: Bed rails Bed/Chair Transfer: 6: Sit > Supine: No assist;5: Chair or W/C > Bed: Supervision (verbal cues/safety issues)  FIM - Locomotion: Wheelchair Distance:  150 Locomotion: Wheelchair: 0: Activity did not occur FIM - Locomotion: Ambulation Locomotion: Ambulation Assistive Devices: Designer, industrial/productWalker - Rolling Ambulation/Gait Assistance: 5: Supervision Locomotion: Ambulation: 5: Travels 150 ft or more with supervision/safety issues  Comprehension Comprehension Mode: Auditory Comprehension: 5-Follows basic conversation/direction: With extra time/assistive device  Expression Expression Mode: Verbal Expression:  6-Expresses complex ideas: With extra time/assistive device  Social Interaction Social Interaction: 5-Interacts appropriately 90% of the time - Needs monitoring or encouragement for participation or interaction.  Problem Solving Problem Solving: 6-Solves complex problems: With extra time  Memory Memory: 5-Requires cues to use assistive device  Medical Problem List and Plan:  Right PICA infarct  1. DVT Prophylaxis/Anticoagulation: Pharmaceutical: Lovenox  2. Pain Management: Prn tylenol  3. Mood: Provide ego support. LCSW to follow for evaluation and support.  4. Neuropsych: This patient is capable of making decisions on her own behalf.  5. Fluid overload:no ivF.6. Induced Hypernatremia: Resolved.  7. Wheezing: likely has component of hyperactive airway disease v/s COPD. Will add nebs prn to help with symptoms. Patient instructed to use flutter valve every 2 hours while awake.  8. HTN: Monitor with bid checks. Continue bisoprolol/HCTZ. Cardizem  9. ABLA: recheck CBC in am. Will order stool guaiacs.  10. Constipation: Reports no BM since admission--will set bowel program.  11.  Urinary freq, suspect CVA related UA neg, may be still mobilizing fluid.  increase imipramine  LOS (Days) 8 A FACE TO FACE EVALUATION WAS PERFORMED  Erick Colacendrew E Faythe Heitzenrater 02/14/2014, 9:21 AM

## 2014-02-14 NOTE — Progress Notes (Signed)
Physical Therapy Session Note  Patient Details  Name: Cleda ClarksMargaret L Akopyan MRN: [id number] Date of Birth: [birth year]  Today's Date: 02/14/2014 Time: [phone] and 1022-1115 Time Calculation (min): 45 min and 53 min  Short Term Goals: Week 1:  PT Short Term Goal 1 (Week 1): = LTG secondary to short LOS  Skilled Therapeutic Interventions/Progress Updates:   Pt resting in bed; pt reporting having a phone conversation with her son (who is out of state); son is concerned about D/C plan and pt being able to manage her laundry, kitchen/cooking and grocery tasks, and emptying a BSC each day.  Pt also concerned about being able to manage these tasks on her own.  Pt unable to recall conversations from previous days problem solving ways to manage these tasks with use of 4WW with seat/basket or RW with bag to transport items around the house.  Pt stated again that she could not manage a 4WW loading it into and out of a car.  Again educated pt on that she would not be driving initially and if she were going out in the community with the 4WW she would have assistance loading it into and out of the car.  Pt agreeable to trying loading dirty clothes into a bag on RW and taking them to the laundry room to wash them.  Pt transferred to EOB mod I and donned shoes EOB mod I.  Placed bag on RW and pt ambulated around the room and retrieved dirty clothes off of dresser and from low drawer with supervision and placed them in her bag.  Pt ambulated to laundry room and was able to load laundry in washer with supervision.  Pt continued ambulation with RW over carpet x 15' x 2 with supervision and rested in day room where pt and PT continued to discuss D/C concerns and alternatives to D/C plan including ALF or hiring assistance once her daughter leaves to assist with household tasks and getting groceries.  Pt returned to room and performed all toileting tasks mod I.  Returned to w/c to rest until OT B&D.  Second session: pt  fatigued after back to back therapies but agreeable to therapy.  Transferred to EOB mod I; pt able to recall that she needs to get her laundry out of the dryer at 11:50.  Performed ambulation with RW x 150' supervision to gym.  Performed NMR for gaze stabilization and vestibular re-education beginning in sitting without UE or back support progressing to standing with UE support on RW with supervision during VOR x 1 horizontal and vertical head movements x 2 reps at 30 sec each with intermittent rest breaks secondary to dizziness and nausea. Pt able to complete smooth pursuits without dizziness but when head movements added pt reports dizziness.  Attempted standing VOR x 1 with added marching in place but unable to coordinate head movement with marching.  At the appropriate time pt requested to return to laundry room and pick up clothes.  Pt able to retrieve clothes from dryer, load in bag, return to room and fold and put away clothes in dresser mod I with RW.  Pt returned to bed to rest.        Therapy Documentation Precautions:  Precautions Precautions: Fall Precaution Comments: Cerebellar stroke - ataxia; Notify physician if increase in NIHSS score of 4 or more, patient c/o severe headache, develops nausea/vomiting, pulse less than 50 or greater than 120, systolic BP less than 100 or greater than or equal to 180, diastolic BP  less than 70 or greater than or equal to 105, respiratory rate greater than 30, temperature greater than 100.5 F Restrictions Weight Bearing Restrictions: No Vital Signs: Therapy Vitals Temp: 97.8 F (36.6 C) Temp src: Oral Pulse Rate: 71 Resp: 16 BP: 159/78 mmHg Patient Position, if appropriate: Lying Oxygen Therapy SpO2: 95 % O2 Device: None (Room air) Pain:  Reports OA pain but did not wish to request medication for pain Locomotion : Ambulation Ambulation/Gait Assistance: 5: Supervision   See FIM for current functional status  Therapy/Group: Individual  Therapy  McDade Desanctisudra Faucette Hall 02/14/2014, 9:42 AM

## 2014-02-14 NOTE — Progress Notes (Signed)
Occupational Therapy Session Note  Patient Details  Name: Cleda ClarksMargaret L Shutt MRN: [id number] Date of Birth: [birth year]  Today's Date: 02/14/2014 Time: [phone] [phone] Time Calculation (min): 55 min and 30 min  Short Term Goals: Week 1:  OT Short Term Goal 1 (Week 1): Pt will complete walk-in shower transfer at supervision level ambulating with necessary AD OT Short Term Goal 2 (Week 1): Pt will complete toilet transfer at supervision level ambulating with necessary AD OT Short Term Goal 3 (Week 1): Pt will complete LB dressing with supervision OT Short Term Goal 4 (Week 1): Pt will complete simple meal prep with supervision with necessary AD  Skilled Therapeutic Interventions/Progress Updates:    1) Engaged in ADL retraining with focus on increased independence with self-care tasks of bathing and dressing and problem solving use of RW with home management tasks.  Pt mod I with all mobility, bathing, and dressing with RW and at a sit > stand level.  Pt fatigued throughout session and appeared anxious about impending d/c with decreased recall of prior discussions regarding home management and modifications.  Pt able to recall need to move laundry from washer to dryer.  Discussed use of Rollator for home management tasks to increase safety with balance while utilizing seat for either energy conservation or to transport items while maintaining safety with walker.   2) Engaged in functional mobility in ADL apt with RW and Rollator while engaging in emptying BSC and transporting bucket to toilet to empty.  With RW pt has to hold bucket by handle while also maintaining control of RW and then sidestep through bathroom door to enter.  With Rollator pt able to place bucket on seat and walk forward into bathroom, pt reports this is more manageable.  However upon return to room, pt reports Rollator would be too combersome in home environment and would prefer RW.    Therapy Documentation Precautions:   Precautions Precautions: Fall Precaution Comments: Cerebellar stroke - ataxia; Notify physician if increase in NIHSS score of 4 or more, patient c/o severe headache, develops nausea/vomiting, pulse less than 50 or greater than 120, systolic BP less than 100 or greater than or equal to 180, diastolic BP less than 70 or greater than or equal to 105, respiratory rate greater than 30, temperature greater than 100.5 F Restrictions Weight Bearing Restrictions: No Pain:  Pt with no c/o pain  See FIM for current functional status  Therapy/Group: Individual Therapy  Dillard EssexSarah M Abreanna Drawdy 02/14/2014, 11:31 AM

## 2014-02-15 ENCOUNTER — Inpatient Hospital Stay (HOSPITAL_COMMUNITY): Payer: Medicare Other | Admitting: Occupational Therapy

## 2014-02-15 ENCOUNTER — Inpatient Hospital Stay (HOSPITAL_COMMUNITY): Payer: Medicare Other | Admitting: *Deleted

## 2014-02-15 ENCOUNTER — Inpatient Hospital Stay (HOSPITAL_COMMUNITY): Payer: Medicare Other | Admitting: Physical Therapy

## 2014-02-15 ENCOUNTER — Encounter (HOSPITAL_COMMUNITY): Payer: Medicare Other

## 2014-02-15 MED ORDER — ONDANSETRON HCL 4 MG PO TABS
4.0000 mg | ORAL_TABLET | Freq: Four times a day (QID) | ORAL | Status: DC | PRN
  Administered 2014-02-15: 4 mg via ORAL
  Filled 2014-02-15: qty 1

## 2014-02-15 MED ORDER — ACETAMINOPHEN 325 MG PO TABS: 650.0000 mg | ORAL_TABLET | Freq: Every evening | ORAL | Status: DC | PRN

## 2014-02-15 NOTE — Progress Notes (Signed)
Occupational Therapy Session Note  Patient Details  Name: Tammy Barron MRN: [id number] Date of Birth: Jun 30, 1941  Today's Date: 02/15/2014 Time: 0933-1030 Time Calculation (min): 57 min  Short Term Goals: Week 1:  OT Short Term Goal 1 (Week 1): Pt will complete walk-in shower transfer at supervision level ambulating with necessary AD OT Short Term Goal 2 (Week 1): Pt will complete toilet transfer at supervision level ambulating with necessary AD OT Short Term Goal 3 (Week 1): Pt will complete LB dressing with supervision OT Short Term Goal 4 (Week 1): Pt will complete simple meal prep with supervision with necessary AD  Skilled Therapeutic Interventions/Progress Updates:    Pt completed ADL retraining at overall mod I level with focus on energy conservation and problem solving with self-care and home management tasks.  Pt mod I with all mobility, bathing, and dressing this session with RW with gathering items prior to engaging in bathing task.  Pt reports need to continue established routine to ensure safety and energy conservation.  Pt reports having loaned her late husband's BSC to the church and is unsure of it's condition or whereabouts and is hoping her children can locate her shower chair.  [facility] and possibly shower chair if family cannot find it as pt will benefit from both.     Therapy Documentation Precautions:  Precautions Precautions: Fall Precaution Comments: Cerebellar stroke - ataxia; Notify physician if increase in NIHSS score of 4 or more, patient c/o severe headache, develops nausea/vomiting, pulse less than 50 or greater than 120, systolic BP less than 100 or greater than or equal to 180, diastolic BP less than 70 or greater than or equal to 105, respiratory rate greater than 30, temperature greater than 100.5 F Restrictions Weight Bearing Restrictions: No Pain: Pain Assessment Pain Assessment: No/denies pain  See FIM for current functional  status  Therapy/Group: Individual Therapy  Brand MalesSarah M Aiyannah Fayad 02/15/2014, 12:02 PM

## 2014-02-15 NOTE — Progress Notes (Addendum)
Subjective/Complaints: 73 y.o. female with history of HTN, anxiety/depression, tobacco abuse who was admitted on 01/31/14 with two day history of dizziness, N &V progressing to difficulty walking. CT head with extensive right cerebellar edema causing mass effect and effacement on the fourth ventricle and relative hydrocephalus of the lateral and third ventricles and concern of potential mass right cerebellum v/s acute infarct. MRI/MRA brain with acute R-PICA infarct in cerebellum with edema and mass effect on 4th ventricle without hydro and occlusion of R-PICA c/w acute infarct. She was started on hypertonic saline for dema control and ASA 325 mg for secondary stroke prevention. 2D echo with EF 55-65% with no wall abnormalities and grade 1 diastolic dysfunction. Carotid dopplers with 60-79% R- ICA stenosis, R-VA retrograde flow and inability to visualize L-ICA due to dressings. New onset A fib treated with IV Cardizem  Left shoulder pain mainly at noc and with overhead activity Discussed Neuropsych eval  Review of Systems - Negative except Left shoulder pain "flared up" chronic problem  Objective: Vital Signs: Blood pressure 121/68, pulse 67, temperature 98 F (36.7 C), temperature source Oral, resp. rate 16, weight 84 kg (185 lb 3 oz), SpO2 96.00%. No results found. No results found for this or any previous visit (from the past 72 hour(s)).   HEENT: normal Cardio: RRR and no murmur Resp: CTA B/L and unlabored GI: BS positive and NT,ND Extremity:  Pulses positive and No Edema Skin:   Intact Neuro: Alert/Oriented, Cranial Nerve II-XII normal, Normal Motor and Abnormal FMC Ataxic/ dec FMC Musc/Skel:  Left shoulder positive impingement sign pain with adduction and abduction GEN NAD   Assessment/Plan: 1. Functional deficits secondary to Right PICA cardioembolic infarct which require 3+ hours per day of interdisciplinary therapy in a comprehensive inpatient rehab setting. Physiatrist is  providing close team supervision and 24 hour management of active medical problems listed below. Physiatrist and rehab team continue to assess barriers to discharge/monitor patient progress toward functional and medical goals. FIM: FIM - Bathing Bathing Steps Patient Completed: Chest;Right Arm;Left Arm;Abdomen;Front perineal area;Buttocks;Right upper leg;Left upper leg;Right lower leg (including foot);Left lower leg (including foot) Bathing: 6: More than reasonable amount of time  FIM - Upper Body Dressing/Undressing Upper body dressing/undressing steps patient completed: Thread/unthread right sleeve of pullover shirt/dresss;Thread/unthread left sleeve of pullover shirt/dress;Put head through opening of pull over shirt/dress;Pull shirt over trunk Upper body dressing/undressing: 6: More than reasonable amount of time FIM - Lower Body Dressing/Undressing Lower body dressing/undressing steps patient completed: Thread/unthread right pants leg;Thread/unthread left pants leg;Pull pants up/down;Don/Doff right sock;Don/Doff left sock;Don/Doff right shoe;Don/Doff left shoe;Thread/unthread right underwear leg;Thread/unthread left underwear leg;Pull underwear up/down Lower body dressing/undressing: 6: More than reasonable amount of time  FIM - Toileting Toileting steps completed by patient: Adjust clothing prior to toileting;Performs perineal hygiene;Adjust clothing after toileting Toileting Assistive Devices: Grab bar or rail for support Toileting: 6: Assistive device: No helper  FIM - Diplomatic Services operational officerToilet Transfers Toilet Transfers Assistive Devices: Art gallery managerWalker Toilet Transfers: 5-From toilet/BSC: Supervision (verbal cues/safety issues);5-To toilet/BSC: Supervision (verbal cues/safety issues)  FIM - BankerBed/Chair Transfer Bed/Chair Transfer Assistive Devices: Bed rails Bed/Chair Transfer: 6: Supine > Sit: No assist;6: Sit > Supine: No assist;5: Bed > Chair or W/C: Supervision (verbal cues/safety issues);5: Chair or W/C >  Bed: Supervision (verbal cues/safety issues)  FIM - Locomotion: Wheelchair Distance: 150 Locomotion: Wheelchair: 0: Activity did not occur FIM - Locomotion: Ambulation Locomotion: Ambulation Assistive Devices: Designer, industrial/productWalker - Rolling Ambulation/Gait Assistance: 5: Supervision Locomotion: Ambulation: 5: Travels 150 ft or more with supervision/safety  issues  Comprehension Comprehension Mode: Auditory Comprehension: 5-Follows basic conversation/direction: With extra time/assistive device  Expression Expression Mode: Verbal Expression: 6-Expresses complex ideas: With extra time/assistive device  Social Interaction Social Interaction: 5-Interacts appropriately 90% of the time - Needs monitoring or encouragement for participation or interaction.  Problem Solving Problem Solving: 6-Solves complex problems: With extra time  Memory Memory: 5-Requires cues to use assistive device  Medical Problem List and Plan:  Right PICA infarct  1. DVT Prophylaxis/Anticoagulation: Pharmaceutical: d/c Lovenox amb 300' has abd ecchymosis 2. Pain Management: Prn tylenol , add kpad for Left shoulder pain 3. Mood: Provide ego support. LCSW to follow for evaluation and support.  4. Neuropsych: This patient is capable of making decisions on her own behalf.  5. Fluid overload:no ivF.6. Induced Hypernatremia: Resolved.  7. Wheezing: likely has component of hyperactive airway disease v/s COPD. Will add nebs prn to help with symptoms. Patient instructed to use flutter valve every 2 hours while awake.  8. HTN: Monitor with bid checks. Continue bisoprolol/HCTZ. Cardizem  9. ABLA: recheck CBC in am. Will order stool guaiacs.  10. Constipation: Reports no BM since admission--will set bowel program.  11.  Urinary freq, improved on  imipramine  LOS (Days) 9 A FACE TO FACE EVALUATION WAS PERFORMED  Erick Colacendrew E Kirsteins 02/15/2014, 7:43 AM

## 2014-02-15 NOTE — Progress Notes (Signed)
Occupational Therapy Discharge Summary  Patient Details  Name: Tammy Barron MRN: [id number] Date of Birth: 03-Feb-1941  Today's Date: 02/15/2014  Patient has met 11 of 11 long term goals due to improved activity tolerance, improved balance, ability to compensate for deficits, improved attention and improved awareness.  Patient to discharge at overall Modified Independent level, supervision initially for IADL tasks.  Patient's care partner is independent to provide the necessary cognitive assistance at discharge.  Daughter plans to stay with patient for 1 week during transition back to home to assist with setup of routine.  Reasons goals not met: N/A  Recommendation:  Patient will benefit from ongoing skilled OT services in home health setting to continue to advance functional skills in the area of BADL and Reduce care partner burden.  Equipment: BSC  Reasons for discharge: treatment goals met and discharge from hospital  Patient/family agrees with progress made and goals achieved: Yes  OT Discharge Pain Pain Assessment Pain Assessment: No/denies pain ADL  See FIM Vision/Perception  Vision- History Baseline Vision/History: Wears glasses Wears Glasses: Reading only Patient Visual Report: Blurring of vision Vision- Assessment Vision Assessment?: Yes Eye Alignment: Within Functional Limits Ocular Range of Motion: Within Functional Limits Alignment/Gaze Preference: Within Defined Limits Tracking/Visual Pursuits: Decreased smoothness of horizontal tracking;Requires cues, head turns, or add eye shifts to track;Other (comment) Saccades: Additional eye shifts occurred during testing;Decreased speed of saccadic movement Convergence: Within functional limits Visual Fields: No apparent deficits Depth Perception: Undershoots  Cognition Orientation Level: Oriented X4 Safety/Judgment: Appears intact Comments: Pt with decreased STM, however procedural memory intact.   Sensation Sensation Light Touch: Appears Intact Stereognosis: Not tested Hot/Cold: Not tested Proprioception: Appears Intact Coordination Gross Motor Movements are Fluid and Coordinated: No Fine Motor Movements are Fluid and Coordinated: No Balance Berg Balance Test Sit to Stand: Able to stand  independently using hands Standing Unsupported: Able to stand 2 minutes with supervision Sitting with Back Unsupported but Feet Supported on Floor or Stool: Able to sit safely and securely 2 minutes Stand to Sit: Sits safely with minimal use of hands Transfers: Able to transfer safely, definite need of hands Standing Unsupported with Eyes Closed: Able to stand 10 seconds safely Standing Ubsupported with Feet Together: Able to place feet together independently and stand for 1 minute with supervision From Standing, Reach Forward with Outstretched Arm: Can reach confidently >25 cm (10") From Standing Position, Pick up Object from Floor: Unable to try/needs assist to keep balance From Standing Position, Turn to Look Behind Over each Shoulder: Looks behind from both sides and weight shifts well Turn 360 Degrees: Able to turn 360 degrees safely but slowly Standing Unsupported, Alternately Place Feet on Step/Stool: Able to complete >2 steps/needs minimal assist Standing Unsupported, One Foot in Front: Able to take small step independently and hold 30 seconds Standing on One Leg: Tries to lift leg/unable to hold 3 seconds but remains standing independently Total Score: 38 Extremity/Trunk Assessment RUE Assessment RUE Assessment: Within Functional Limits LUE Assessment LUE Assessment: Within Functional Limits  See FIM for current functional status  Kerrie Buffalo 02/15/2014, 2:58 PM

## 2014-02-15 NOTE — Progress Notes (Signed)
Physical Therapy Discharge Summary  Patient Details  Name: Tammy Barron MRN: [id number] Date of Birth: May 08, 1941  Today's Date: 02/15/2014 Time: 1102-1200  Time Calculation (min): 58 min - Pt missed 18mn skilled PT second tx due to feeling ill.   Patient has met 6 of 9 long term goals due to improved activity tolerance, improved balance, improved postural control, ability to compensate for deficits, functional use of  left lower extremity and improved coordination.  Patient to discharge at an ambulatory level Supervision for long distance gait and stairs,  Mod I basic transfers, bed mobility, and dynamic balance during functional tasks.  Patient's care partner is independent to provide the necessary cognitive assistance at discharge.   Reasons goals not met: Pt was unable to complete 3 goals due to not feeling well last day of therapy, unable to participate in last tx. Pt needed S for long distance gait, stairs, and did not feel safe attempting floor transfer. Pt will have S level of assist at home while she gains independence in her abilities, and is open to considering alternative living situations should she continue to need S assist after family available. 2  Recommendation:  Patient will benefit from ongoing skilled PT services in home health setting to continue to advance safe functional mobility, address ongoing impairments in strength, dynamic balance, and minimize fall risk.  Equipment: RW  Reasons for discharge: treatment goals met and discharge from hospital  Patient/family agrees with progress made and goals achieved: Yes   Skilled Therapeutic Interventions/Progress Updates:  First tx focused on functional mobility, static and dynamic balance assessment, gait and stairs.  Pt resting in bed with c/o being nervous about DC home tomorrow. Emotional support provided and discussed plan for compensation for memory problems and ways to maintain activity at home.  Pt Mod I with  bed mobility and navigating in room and bathroom with RW. Pt able to demonstrate Mod I with dynamic balance during functional tasks.  Gait in controlled setting 1x150 and 2x100' with RW and S in busy setting of the hall. Pt had no LOB but expressed little confidence in her gait today.  Berg balance test reassessed, pt improved score from 28/56 to 38/56, demonstrating significant increase. Pt still at significant risk for falls group, discussed functional implications with her, advising to use RW at all times. Pt needed 5 seated rest breaks due to fatigue during assessment.  Ascended/descended 12 stairs with 2 rails and S needed during turn at bottom due to LOB, but pt able to recover independently.  Pt walked back to room, needing seated rest, and wanting to return to bed despite encouragement to stay up.    Pt declined second tx due to not feeling well. Pt came to EOB with encouragement, then feeling nauseated, wanting to return to bed. Thus, unable to complete several goals, but pt will have needed level of assist initially upon DC home.      PT Discharge Precautions/Restrictions Precautions Precautions: Fall Precaution Comments: Cerebellar stroke - ataxia; Notify physician if increase in NIHSS score of 4 or more, patient c/o severe headache, develops nausea/vomiting, pulse less than 50 or greater than 1944 systolic BP less than 1967or greater than or equal to 1591 diastolic BP less than 70 or greater than or equal to 105, respiratory rate greater than 30, temperature greater than 100.5 F Restrictions Weight Bearing Restrictions: No   Pain Pain Assessment Pain Assessment: No/denies pain Vision/Perception  Vision - Assessment Eye Alignment: Within Functional Limits Ocular  Range of Motion: Within Functional Limits Alignment/Gaze Preference: Within Defined Limits Tracking/Visual Pursuits: Decreased smoothness of horizontal tracking;Requires cues, head turns, or add eye shifts to track;Other  (comment) Saccades: Additional eye shifts occurred during testing;Decreased speed of saccadic movement Convergence: Within functional limits Perception Perception: Within Functional Limits Praxis Praxis: Intact  Cognition Overall Cognitive Status: Impaired/Different from baseline Arousal/Alertness: Awake/alert Orientation Level: Oriented X4 Memory: Impaired Memory Impairment: Decreased short term memory Safety/Judgment: Appears intact Comments: Pt with decreased STM, however procedural memory intact.  Sensation Sensation Light Touch: Appears Intact Stereognosis: Not tested Hot/Cold: Not tested Proprioception: Appears Intact Coordination Gross Motor Movements are Fluid and Coordinated: Yes Fine Motor Movements are Fluid and Coordinated: No Motor  Motor Motor: Within Functional Limits  Mobility Bed Mobility Bed Mobility: Supine to Sit;Sit to Supine Supine to Sit: 6: Modified independent (Device/Increase time) Sit to Supine: 6: Modified independent (Device/Increase time) Transfers Transfers: Yes Stand Pivot Transfers: 6: Modified independent (Device/Increase time) Locomotion  Ambulation Ambulation: Yes Ambulation/Gait Assistance: 5: Supervision (Mod I short distances; S long distance) Ambulation Distance (Feet): 150 Feet Assistive device: Rolling walker Ambulation/Gait Assistance Details: Mod I short distance, S longer distance/busy environment Gait Gait: Yes Gait Pattern: Within Functional Limits Stairs / Additional Locomotion Stairs: Yes Stairs Assistance: 5: Supervision Stairs Assistance Details (indicate cue type and reason): Supervision guarding. Pt had 1 LOB during turn at bottom of stairs, buy indep recovery.  Stair Management Technique: Two rails;Step to pattern;Forwards Number of Stairs: 12 Height of Stairs: 6 Ramp: Not tested (comment) Curb: Not tested (comment) Wheelchair Mobility Wheelchair Mobility: No  Trunk/Postural Assessment  Cervical  Assessment Cervical Assessment: Within Functional Limits Thoracic Assessment Thoracic Assessment: Within Functional Limits Lumbar Assessment Lumbar Assessment: Within Functional Limits Postural Control Postural Control: Within Functional Limits  Balance Balance Balance Assessed: Yes Standardized Balance Assessment Standardized Balance Assessment: Berg Balance Test Berg Balance Test Sit to Stand: Able to stand  independently using hands Standing Unsupported: Able to stand 2 minutes with supervision Sitting with Back Unsupported but Feet Supported on Floor or Stool: Able to sit safely and securely 2 minutes Stand to Sit: Sits safely with minimal use of hands Transfers: Able to transfer safely, definite need of hands Standing Unsupported with Eyes Closed: Able to stand 10 seconds safely Standing Ubsupported with Feet Together: Able to place feet together independently and stand for 1 minute with supervision From Standing, Reach Forward with Outstretched Arm: Can reach confidently >25 cm (10") From Standing Position, Pick up Object from Floor: Unable to try/needs assist to keep balance From Standing Position, Turn to Look Behind Over each Shoulder: Looks behind from both sides and weight shifts well Turn 360 Degrees: Able to turn 360 degrees safely but slowly Standing Unsupported, Alternately Place Feet on Step/Stool: Able to complete >2 steps/needs minimal assist Standing Unsupported, One Foot in Front: Able to take small step independently and hold 30 seconds Standing on One Leg: Tries to lift leg/unable to hold 3 seconds but remains standing independently Total Score: 38 Extremity Assessment  RUE Assessment RUE Assessment: Within Functional Limits LUE Assessment LUE Assessment: Within Functional Limits RLE Assessment RLE Assessment: Within Functional Limits (except hip flexion 4-/5) LLE Assessment LLE Assessment: Exceptions to [facility] LLE Strength LLE Overall Strength: Deficits LLE  Overall Strength Comments: 4-/5  See FIM for current functional status  Kennieth Rad, PT, DPT  02/15/2014, 3:35 PM

## 2014-02-15 NOTE — Progress Notes (Signed)
Recreational Therapy Session Note  Patient Details  Name: Tammy Barron MRN: [id number] Date of Birth: [birth year] Today's Date: 02/15/2014 LATE ENTRY for 03/16/14  Pain: no c/o Skilled Therapeutic Interventions/Progress Updates: Pt stating concerns about discharge planning and that children wanted to make decisions for her.  She reviewed her discharge options and listed pros & cons for each.  Pt stated " I really want to go home & I think I can manage."  Pt stated that her daughter was planning to come & assist her initially at discharge.  Pt then stated concerns for limited ability to participate in community pursuits specifically getting meds & groceries.  Pt problem solved through options with mod I.  Provided pt with contact information for local [employer] for pt to inquire about home delivery of groceries.  Pt contacted grocery store and problem solved through her options independently with customer service representative and wrote down detailed information for future use.  Pt also discussed home delivery options for local pharmacy.  At end of session pt stated that she would delegate sign up of the above listed activities to her children (her son, in particular) so that they/he felt actively involved in her decision making & discharge planning.  Team aware of the above.  Therapy/Group: Individual Therapy   Clois DupesLisa B Antuane Eastridge 02/15/2014, 2:43 PM

## 2014-02-16 ENCOUNTER — Inpatient Hospital Stay (HOSPITAL_COMMUNITY): Payer: Medicare Other | Admitting: Speech Pathology

## 2014-02-16 DIAGNOSIS — I6350 Cerebral infarction due to unspecified occlusion or stenosis of unspecified cerebral artery: Secondary | ICD-10-CM

## 2014-02-16 DIAGNOSIS — I69993 Ataxia following unspecified cerebrovascular disease: Secondary | ICD-10-CM

## 2014-02-16 MED ORDER — POLYETHYLENE GLYCOL 3350 17 G PO PACK: 17.0000 g | PACK | Freq: Every day | ORAL | Status: DC

## 2014-02-16 MED ORDER — POTASSIUM CHLORIDE CRYS ER 10 MEQ PO TBCR: 10.0000 meq | EXTENDED_RELEASE_TABLET | Freq: Two times a day (BID) | ORAL | Status: DC

## 2014-02-16 MED ORDER — ATORVASTATIN CALCIUM 10 MG PO TABS: 10.0000 mg | ORAL_TABLET | Freq: Every day | ORAL | Status: DC

## 2014-02-16 MED ORDER — GUAIFENESIN ER 600 MG PO TB12: 600.0000 mg | ORAL_TABLET | Freq: Two times a day (BID) | ORAL | Status: DC

## 2014-02-16 MED ORDER — IMIPRAMINE HCL 50 MG PO TABS: 50.0000 mg | ORAL_TABLET | Freq: Every day | ORAL | Status: DC

## 2014-02-16 MED ORDER — DILTIAZEM HCL ER COATED BEADS 120 MG PO CP24: 120.0000 mg | ORAL_CAPSULE | Freq: Every day | ORAL | Status: DC

## 2014-02-16 NOTE — Progress Notes (Signed)
Pt discharged at 1230 with family to home. Discharge instructions given to pt and family with verbal understanding.

## 2014-02-16 NOTE — Discharge Summary (Signed)
Physician Discharge Summary  Patient ID: Tammy ClarksMargaret L Barron MRN: [id number] DOB/AGE: 75-Mar-1942 75 y.o.  Admit date: 02/06/2014 Discharge date: 02/16/2014  Discharge Diagnoses:  Principal Problem:   Cerebellar stroke Active Problems:   ANXIETY   HYPERTENSION   Atrial fibrillation   Thrombocytopenia, unspecified   UTI (urinary tract infection)   Urination frequency   Discharged Condition: Improved  Significant Diagnostic Studies: Dg Chest 2 View  02/06/2014   CLINICAL DATA:  Wheezing.  EXAM: CHEST  2 VIEW  COMPARISON:  DG CHEST 2 VIEW dated 02/03/2014  FINDINGS: Mediastinum and hilar structures are normal. Mild atelectasis versus infiltrate right lung base cannot be excluded. Small right pleural effusion cannot be excluded. Stable elevation of left hemidiaphragm. Stable cardiomegaly, normal pulmonary vascularity. No acute osseous abnormality.  IMPRESSION: 1. Mild infiltrate and/or atelectasis right lung base cannot be excluded. Associated small right pleural effusion cannot be excluded. 2. Stable cardiomegaly.  No CHF. 3. Stable elevation left hemidiaphragm .   Electronically Signed   By: Maisie Fushomas  Register   On: 02/06/2014 17:23    Labs:  Basic Metabolic Panel:  Recent Labs Lab 02/09/14 1220  NA 139  K 4.0  CL 99  CO2 26  GLUCOSE 88  BUN 13  CREATININE 0.67  CALCIUM 9.5    CBC: CBC    Component Value Date/Time   WBC 8.5 02/07/2014 0640   RBC 4.07 02/07/2014 0640   HGB 12.6 02/07/2014 0640   HCT 37.3 02/07/2014 0640   PLT 174 02/07/2014 0640   MCV 91.6 02/07/2014 0640   MCH 31.0 02/07/2014 0640   MCHC 33.8 02/07/2014 0640   RDW 13.5 02/07/2014 0640   LYMPHSABS 1.6 02/07/2014 0640   MONOABS 0.8 02/07/2014 0640   EOSABS 0.1 02/07/2014 0640   BASOSABS 0.0 02/07/2014 0640     CBG: No results found for this basename: GLUCAP,  in the last 168 hours  Brief HPI:   Tammy Barron is a 73 y.o. female with history of HTN, anxiety/depression, tobacco abuse who was admitted on 01/31/14 with two  day history of dizziness, N &V progressing to difficulty walking.  MRI/MRA brain with acute R-PICA infarct in cerebellum with edema and mass effect on 4th ventricle without hydro and occlusion of R-PICA c/w acute infarct. She was started on hypertonic saline for dema control and ASA 325 mg for secondary stroke prevention. She was started on IV Cardizem for new onset A fib and fluid overload with wheezing and BLE edema was treated with diuretics.  She continued to have ataxic gait and balance deficits and was [facility].    Hospital Course: Tammy Barron was admitted to rehab 02/06/2014 for inpatient therapies to consist of PT, ST and OT at least three hours five days a week. Past admission physiatrist, therapy team and rehab RN have worked together to provide customized collaborative inpatient rehab. She was maintained on ASA for secondary stroke prevention. CXR at admission revealed mild infiltrate and she was maintained on diuretics during her hospitalization. K dur was added to help supplement hypokalemia. Follow up CBC showed improvement in H/H. She has had problems with frequency but no signs of retention seen on PVR checks. UA was negative and she was started on tofranil to help with nocturia and frequency. Bowel program was initiated to help with constipation. Dr. Jacquelyne BalintMcDermott has followed up for support of mild degree of anxiety and depression. Ego support has been provided by team in addition to problem solving on  issues regarding life style changes due to post stroke effects.  She has progressed to modified independent level and family is to provide supervision level for a week to help with transition to home. She will continue to receive HHPT, HHOT, HHST as well as SW by [facility].    Rehab course: During patient's stay in rehab weekly team conferences were held to monitor patient's progress, set goals and discuss barriers to discharge. Patient has had  improvement in activity tolerance, balance, postural control, attention and awareness. She is modified independent for ADL tasks. Supervision is recommended initially for IADLs and her daughter is to assist with transition to home.  deficits. She is modified independent for transfers, balance and ambulation in controlled environment with use of RW. She requires supervision for stair navigation as well as long distance gait. Family education was completed with recommendations for supervision due to decrease in STM and reinforcement of compensatory strategies.   Disposition:  Home  Diet: Cardiac diet.   Special Instructions: 1. Stop drinking fluids after 7 pm and use ice chips as needed.  2. No driving till cleared by  MD.     Future Appointments Provider Department Dept Phone   02/16/2014 11:30 AM Ophelia ShoulderMelissa M Bowie, CCC-SLP [phone]   03/01/2014 11:15 AM Gordy SaversPeter F Kwiatkowski, MD [facility] [phone]   03/23/2014 10:15 AM Erick ColaceAndrew E Kirsteins, MD [facility] Claudette [facility] [phone]       Medication List    STOP taking these medications       meloxicam 15 MG tablet  Commonly known as:  MOBIC      TAKE these medications       aspirin 325 MG tablet  Take 1 tablet (325 mg total) by mouth daily.     atorvastatin 10 MG tablet  Commonly known as:  LIPITOR  Take 1 tablet (10 mg total) by mouth daily at 6 PM.     bisoprolol-hydrochlorothiazide 10-6.25 MG per tablet  Commonly known as:  ZIAC  Take 1 tablet by mouth daily.     clorazepate 3.75 MG tablet  Commonly known as:  TRANXENE  Take 1 tablet (3.75 mg total) by mouth 2 (two) times daily as needed for anxiety.     cycloSPORINE 0.05 % ophthalmic emulsion  Commonly known as:  RESTASIS  Place 1 drop into both eyes 2 (two) times daily.     diltiazem 120 MG 24 hr capsule  Commonly known as:  CARDIZEM CD  Take 1 capsule (120 mg total) by mouth daily.      guaiFENesin 600 MG 12 hr tablet  Commonly known as:  MUCINEX  Take 1 tablet (600 mg total) by mouth 2 (two) times daily.     imipramine 50 MG tablet  Commonly known as:  TOFRANIL  Take 1 tablet (50 mg total) by mouth at bedtime. For hyperactive bladder     multivitamin tablet  Take 1 tablet by mouth daily.     polyethylene glycol packet  Commonly known as:  MIRALAX / GLYCOLAX  Take 17 g by mouth daily. For constipation     potassium chloride 10 MEQ tablet  Commonly known as:  K-DUR,KLOR-CON  Take 1 tablet (10 mEq total) by mouth 2 (two) times daily.           Follow-up Information   Follow up with Erick ColaceKIRSTEINS,ANDREW E, MD On 03/23/2014. (Be there at 9:45  for 10:15 am  appointment)  Specialty:  Physical Medicine and Rehabilitation   Contact information:   [address] [place] [address] [place] [address] [phone]       Follow up with Gates Rigg, MD. Call today. (for follow up in 6 weeks. )    Specialties:  Neurology, Radiology   Contact information:   [address] [place] [address] [phone]       Follow up with Rogelia Boga, MD On 03/01/2014. (@ 11:15 am)    Specialty:  Internal Medicine   Contact information:   [address] [place] [address] [phone]       Signed: Jacquelynn Cree 02/16/2014, 10:44 AM

## 2014-02-16 NOTE — Discharge Instructions (Signed)
Inpatient Rehab Discharge Instructions  Tammy Barron Discharge date and time:    Activities/Precautions/ Functional Status: Activity: activity as tolerated Diet: cardiac diet Wound Care:  Functional status:  ___ No restrictions     ___ Walk up steps independently ___ 24/7 supervision/assistance   ___ Walk up steps with assistance ___ Intermittent supervision/assistance  ___ Bathe/dress independently ___ Walk with walker     ___ Bathe/dress with assistance ___ Walk Independently    ___ Shower independently ___ Walk with assistance    ___ Shower with assistance ___ No alcohol     ___ Return to work/school ________  COMMUNITY REFERRALS UPON DISCHARGE:   Home Health:   PT     OT     ST    RN    SW     Agency:  [facility] Phone:  [phone]  Medical Equipment/Items Ordered:  None at this time.  Pt had a rolling walker, bedside commode, and shower chair at home already.   GENERAL COMMUNITY RESOURCES FOR PATIENT/FAMILY: Support Groups:  [facility]                                 3rd Sunday of the month at 3:00 pm (except June, July, and August)                                      In the dayroom of the Inpatient Rehab Unit (4W) at [facility]                                                                      Mental Health:  For Neuropsychology follow up, if desired                                  Dr. Karen Pollard, PsyD                                  [facility]                                  [address]                                   [place], [place]  [id number]                                   [phone]                                   Special Instructions: 1. Stop drinking fluids after 7 pm and use ice chips as needed.  2. No driving till cleared by  MD.  My questions have been answered and I understand these instructions. I will adhere to these goals and the provided educational materials  after my discharge from the hospital.  Patient/Caregiver Signature _______________________________ Date __________  Clinician Signature _______________________________________ Date __________  Please bring this form and your medication list with you to all your follow-up doctor's appointments.

## 2014-02-16 NOTE — Progress Notes (Signed)
Subjective/Complaints: 73 y.o. female with history of HTN, anxiety/depression, tobacco abuse who was admitted on 01/31/14 with two day history of dizziness, N &V progressing to difficulty walking. CT head with extensive right cerebellar edema causing mass effect and effacement on the fourth ventricle and relative hydrocephalus of the lateral and third ventricles and concern of potential mass right cerebellum v/s acute infarct. MRI/MRA brain with acute R-PICA infarct in cerebellum with edema and mass effect on 4th ventricle without hydro and occlusion of R-PICA c/w acute infarct. She was started on hypertonic saline for dema control and ASA 325 mg for secondary stroke prevention. 2D echo with EF 55-65% with no wall abnormalities and grade 1 diastolic dysfunction. Carotid dopplers with 60-79% R- ICA stenosis, R-VA retrograde flow and inability to visualize L-ICA due to dressings. New onset A fib treated with IV Cardizem  Some anxiety over dc. Otherwise doing pretty well.   Review of Systems - Negative otherwise  Objective: Vital Signs: Blood pressure 114/63, pulse 73, temperature 97.7 F (36.5 C), temperature source Oral, resp. rate 17, weight 83.1 kg (183 lb 3.2 oz), SpO2 94.00%. No results found. No results found for this or any previous visit (from the past 72 hour(s)).   HEENT: normal Cardio: RRR and no murmur Resp: CTA B/L and unlabored GI: BS positive and NT,ND Extremity:  Pulses positive and No Edema Skin:   Intact Neuro: Alert/Oriented, Cranial Nerve II-XII normal, Normal Motor and Abnormal FMC Ataxic/ dec FMC Musc/Skel:  Left shoulder positive impingement sign pain with adduction and abduction GEN NAD   Assessment/Plan: 1. Functional deficits secondary to Right PICA cardioembolic infarct which require 3+ hours per day of interdisciplinary therapy in a comprehensive inpatient rehab setting. Physiatrist is providing close team supervision and 24 hour management of active medical  problems listed below. Physiatrist and rehab team continue to assess barriers to discharge/monitor patient progress toward functional and medical goals.  Home today   FIM: FIM - Bathing Bathing Steps Patient Completed: Chest;Right Arm;Left Arm;Abdomen;Front perineal area;Buttocks;Right upper leg;Left upper leg;Right lower leg (including foot);Left lower leg (including foot) Bathing: 6: More than reasonable amount of time  FIM - Upper Body Dressing/Undressing Upper body dressing/undressing steps patient completed: Thread/unthread right sleeve of pullover shirt/dresss;Thread/unthread left sleeve of pullover shirt/dress;Put head through opening of pull over shirt/dress;Pull shirt over trunk Upper body dressing/undressing: 6: More than reasonable amount of time FIM - Lower Body Dressing/Undressing Lower body dressing/undressing steps patient completed: Thread/unthread right pants leg;Thread/unthread left pants leg;Pull pants up/down;Don/Doff right sock;Don/Doff left sock;Don/Doff right shoe;Don/Doff left shoe;Thread/unthread right underwear leg;Thread/unthread left underwear leg;Pull underwear up/down Lower body dressing/undressing: 6: More than reasonable amount of time  FIM - Toileting Toileting steps completed by patient: Adjust clothing prior to toileting;Performs perineal hygiene;Adjust clothing after toileting Toileting Assistive Devices: Grab bar or rail for support Toileting: 6: Assistive device: No helper  FIM - Diplomatic Services operational officerToilet Transfers Toilet Transfers Assistive Devices: Art gallery managerWalker Toilet Transfers: 6-Assistive device: No helper;6-To toilet/ BSC;6-From toilet/BSC  FIM - BankerBed/Chair Transfer Bed/Chair Transfer Assistive Devices: Arm rests;Walker Bed/Chair Transfer: 6: Supine > Sit: No assist;6: Sit > Supine: No assist;6: Bed > Chair or W/C: No assist;6: Chair or W/C > Bed: No assist  FIM - Locomotion: Wheelchair Distance: 150 Locomotion: Wheelchair: 0: Activity did not occur (Pt  ambulatory) FIM - Locomotion: Ambulation Locomotion: Ambulation Assistive Devices: Designer, industrial/productWalker - Rolling Ambulation/Gait Assistance: 5: Supervision (Mod I short distances; S long distance) Locomotion: Ambulation: 5: Household Independent - travels 50 - 149 ft independent or modified independent  Comprehension Comprehension Mode: Auditory Comprehension: 5-Follows basic conversation/direction: With no assist  Expression Expression Mode: Verbal Expression: 5-Expresses basic needs/ideas: With no assist  Social Interaction Social Interaction: 5-Interacts appropriately 90% of the time - Needs monitoring or encouragement for participation or interaction.  Problem Solving Problem Solving: 5-Solves complex 90% of the time/cues < 10% of the time  Memory Memory: 6-More than reasonable amt of time  Medical Problem List and Plan:  Right PICA infarct  1. DVT Prophylaxis/Anticoagulation: Pharmaceutical: d/c Lovenox amb 300' has abd ecchymosis 2. Pain Management: Prn tylenol , add kpad for Left shoulder pain 3. Mood: Provide ego support. LCSW to follow for evaluation and support.  4. Neuropsych: This patient is capable of making decisions on her own behalf.  5. Fluid overload:no ivF. 6. Induced Hypernatremia: Resolved.  7. Wheezing: likely has component of hyperactive airway disease v/s COPD.  nebs prn to help with symptoms. Patient instructed to use flutter valve every 2 hours while awake.  8. HTN: Monitor with bid checks. Continue bisoprolol/HCTZ. Cardizem  9. ABLA: recheck CBC in am. Will order stool guaiacs.  10. Constipation:  set bowel program.  11.  Urinary freq, improved on  imipramine  LOS (Days) 10 A FACE TO FACE EVALUATION WAS PERFORMED  Ranelle OysterZachary T Swartz 02/16/2014, 9:46 AM

## 2014-02-16 NOTE — Progress Notes (Signed)
Speech Language Pathology Note  Patient Details  Name: Tammy Barron MRN: [id number] Date of Birth: 03-24-1941 Today's Date: 02/16/2014  SLP present for wrap up of information presented in yesterday's family meeting.  Patient, son and daughter present. To answer last minute questions and concerns regarding discharge home. Discussed recommendations secondary to decreased short term memory and provided with a handout to reinforce use of compensatory strategies with familiar and new learning tasks.  Daughter voiced concerns regarding IADLs (cooking, home management, and medication management) to which the team discussed simulated trials during stay on rehab and suggestion for initial supervision during transition to home.  Daughter affirmed that her plan is to stay for a week during this transition phase. SLP strongly encouraged her to not assist mother but supervise and allow increased time for her to problem solve on her own during transition period. Patient and family set-up with home health therapy and resources per Child psychotherapistsocial worker.    Fae PippinMelissa Jujhar Everett, M.A., CCC-SLP [phone]  [name] 02/16/2014, 3:59 PM

## 2014-02-16 NOTE — Progress Notes (Signed)
Occupational Therapy Note  Patient Details  Name: Tammy Barron MRN: [id number] Date of Birth: 10-13-1941 Today's Date: 02/15/2014 Time: 1400-1435 (35 mins)  Engaged in family meeting with pt, pt's, son, treatment team, and SWK to answer pt's sons questions and concerns regarding d/c home.  Son was able to observe pt's mobility with RW when ambulating from room to conference room.  Discussed recommendations secondary to decreased STM with new tasks and education regarding compensatory strategies.  Discussed procedural memory as pt has increased safety awareness with familiar functional tasks.  Son voiced concerns regarding IADLs (cooking, home management, and medication management) to which the team discussed simulated trials during stay on rehab and suggestion for initial supervision during transition to home, which plan is for daughter to stay for a week during transition phase.  Plan for daughter to speak with a member of treatment team prior to d/c to discuss above and encourage daughter to solely supervise and allow pt to problem solve during transition period.   Brand MalesSarah M Bow Buntyn 02/16/2014, 1:47 PM

## 2014-02-19 ENCOUNTER — Telehealth: Payer: Self-pay | Admitting: Internal Medicine

## 2014-02-19 ENCOUNTER — Telehealth: Payer: Self-pay | Admitting: Neurology

## 2014-02-19 NOTE — Telephone Encounter (Signed)
Please do Prior Authorization for Imipramine.

## 2014-02-19 NOTE — Telephone Encounter (Signed)
Patient's daughter returning call. 

## 2014-02-19 NOTE — Patient Care Conference (Signed)
Inpatient RehabilitationTeam Conference and Plan of Care Update Date: 02/14/2014   Time: 11:30 AM    Patient Name: Tammy Barron      Medical Record Number: [id number]  Date of Birth: 1940/12/01 Sex: Female         Room/Bed: 4W10C/4W10C-01 Payor Info: Payor: Advertising copywriterUNITED HEALTHCARE MEDICARE / Plan: AARP MEDICARE COMPLETE / Product Type: *No Product type* /    Admitting Diagnosis: RT CVA  Admit Date/Time:  02/06/2014  3:26 PM Admission Comments: No comment available   Primary Diagnosis:  Cerebellar stroke Principal Problem: Cerebellar stroke  Patient Active Problem List   Diagnosis Date Noted  . UTI (urinary tract infection) 02/14/2014  . Urination frequency 02/14/2014  . CVA (cerebral infarction) 02/06/2014  . Thrombocytopenia, unspecified 02/06/2014  . Atrial fibrillation 02/05/2014  . Hypernatremia, induced 02/05/2014  . Obesity, unspecified 02/05/2014  . Tobacco use disorder 02/05/2014  . Obstructive hydrocephalus 02/01/2014  . Cerebellar stroke 01/31/2014  . ABDOMINAL PAIN 08/22/2009  . KNEE PAIN, LEFT, CHRONIC 02/21/2008  . HYPERLIPIDEMIA 12/07/2007  . ANXIETY 12/07/2007  . DEPRESSION 12/07/2007  . HYPERTENSION 12/07/2007    Expected Discharge Date: Expected Discharge Date: 02/16/14  Team Members Present: Physician leading conference: Dr. Claudette LawsAndrew Kirsteins Social Worker Present: Staci AcostaJenny Nyoka Alcoser, LCSW Nurse Present: Carlean PurlMaryann Barbour, RN PT Present: Edman CircleAudra Hall, PT;Emily Marya AmslerParcell, PT OT Present: Perrin MalteseJames McGuire, Suszanne ConnersT;Sarah Hoxie, OT SLP Present: Fae PippinMelissa Bowie, SLP PPS Coordinator present : Tora DuckMarie Noel, RN, CRRN;Becky Henrene DodgeWindsor, PT     Current Status/Progress Goal Weekly Team Focus  Medical   PICA infarct, mild ataxia  maintain med stability  therapy evals   Bowel/Bladder   continent of B+B taking ditropan and urgency/frequency at HS  continent of B+B  assess cause for increased freq at HS and timed toileting q3hrs   Swallow/Nutrition/ Hydration             ADL's   supervision - approaching Mod I with self-care tasks, supervision IADLs  mod I basic ADLs, supervision advanced ADLs  attention, problem solving, activity tolerance, education on safety and energy conservation strategies   Mobility   Supervision  mod I overall  Gaze stabilization, higher level gait and balance training, D/C planning   Communication             Safety/Cognition/ Behavioral Observations  Supervision-Mod I  Supervision-Mod I  complete family education   Pain   n/a         Skin   n/a          Rehab Goals Patient on target to meet rehab goals: Yes Rehab Goals Revised: none *See Care Plan and progress notes for long and short-term goals.  Barriers to Discharge: no long term family7 assist    Possible Resolutions to Barriers:  achieve Mod I    Discharge Planning/Teaching Needs:  Pt plans to return home with her dtr coming to stay for a week.  Dtr will be able to receive family education.  Son will also come prior to pt's d/c.   Team Discussion:  Pt will need supervision for cognitive issues until she gets in a routine while her dtr is here.  Her anxiety is blocking some of her problem solving and she has difficulty carrying over from day to day with memory impairment.  Pt still has dizziness.  Therapists will meet with pt's son on Thursday 02-15-14 to go over pt's abilities and where she will need assistance.  Revisions to Treatment Plan:  Supervision from dtr for the first week  home, then mod I   Continued Need for Acute Rehabilitation Level of Care: The patient requires daily medical management by a physician with specialized training in physical medicine and rehabilitation for the following conditions: Daily direction of a multidisciplinary physical rehabilitation program to ensure safe treatment while eliciting the highest outcome that is of practical value to the patient.: Yes Daily medical management of patient stability for increased activity during participation  in an intensive rehabilitation regime.: Yes Daily analysis of laboratory values and/or radiology reports with any subsequent need for medication adjustment of medical intervention for : Neurological problems  Vista DeckJennifer Capps Starlena Beil 02/19/2014, 9:47 AM

## 2014-02-19 NOTE — Progress Notes (Signed)
Social Work Discharge Note  The overall goal for the admission was met for:   Discharge location: Yes - home with supervision from dtr for one week  Length of Stay: Yes - 10 days  Discharge activity level: Yes - Mod I with supervision for one week  Home/community participation: Yes   Services provided included: MD, RD, PT, OT, SLP, RN, TR, Pharmacy, [facility]: Private Insurance: [employer] Medicare Complete  Follow-up services arranged: Home Health: RN, PT, OT, ST, SW and Patient/Family request agency HH: [facility], DME: none needed - Pt already had rolling walker, BSC, and shower seat at home  Comments (or additional information):  Patient/Family verbalized understanding of follow-up arrangements: Yes  Individual responsible for coordination of the follow-up plan: pt and dtr  Confirmed correct DME delivered: Tammy Barron 02/19/2014    [place]

## 2014-02-19 NOTE — Telephone Encounter (Signed)
Patient's daughter calling to state that patient needs sooner appointment than scheduled 05/24/14 appointment. Patient's daughter states that she was discharged from the hospital on 02/16/14 and needs sooner appointment. Please call and advise patient.

## 2014-02-19 NOTE — Progress Notes (Signed)
Social Work Patient ID: Tammy Barron, female   DOB: Dec 25, 1940, 73 y.o.   MRN: [id number]  Late entry:  CSW spoke with pt to update her on team conference discussion.  Her niece was present for visit and she is able to help pt about once a week, as she lives in [place], but flies commercially out of [place] and drives through [place] regularly.  Pt's son visited and met with therapists and both son and dtr got to visit with SLP, Gunnar Fusi, prior to d/c.  Pt felt confident returning to her home with her dtr's supervision for the first week.  CSW arranged home health and gave them private duty list, handicap placard application, and SW referral through [facility].

## 2014-02-19 NOTE — Telephone Encounter (Signed)
Called pt and spoke with pt's daughter Victorino DikeJennifer to inform her per Andrey CampanileSandy, CaliforniaRN Dr. Marlis EdelsonSethi's nurse that hosp f/u appt are made 2-3 months after hosp discharge. I informed the daughter that the pt was on the wait list also and that if something sooner comes open that I left her name and number to call her. I advised the pt's daughter that if the pt has any other problems, questions or concerns to call the office. Pt's daughter verbalized understanding.

## 2014-02-19 NOTE — Telephone Encounter (Addendum)
Pt was discharge from [facility] on 02-16-14 and needs a PA on imipramine call [phone]. [facility] has [facility] complete. [facility]  [address] Pt has appt to see dr Kirtland Bouchardk on 03-01-14 can we do PA

## 2014-02-20 NOTE — Consult Note (Signed)
NEUROCOGNITIVE TESTING - CONFIDENTIAL Tammy Barron Inpatient Rehabilitation   Tammy Barron is a 73 year old, right-handed, Caucasian woman, who was previously seen for a neurobehavioral status examination with Tammy FishermanAdam McDermott, PsyD., for evaluation of cognitive and emotional functioning post-stroke.  During that visit, she described experiencing multiple cognitive inefficiencies, and although her total score on an overall measure of mental status was not overtly impaired, there was evidence of memory impairment.  Therefore, it was recommended that she undergo a more extensive neuropsychological screening prior to discharge in order to more clearly delineate the nature and extent of any cognitive deficits and to assist with discharge and treatment planning.  From an emotional standpoint, Dr. Jacquelyne Barron noted that [name] endorsed symptoms consistent with mild depression and anxiety.    PROCEDURES: [2 units of [id number] on 02/15/2014]  The following tests were performed during today's visit: Repeatable Battery for the Assessment of Neuropsychological Status (RBANS, form A), Geriatric Depression Scale (short form, and the Geriatric Anxiety Inventory.  Performance validity measures were also administered.  Test results are as follows:   RBANS Indices Scaled Score Percentile Description  Immediate Memory  78 7 Impaired  Visuospatial/Constructional 89 23 Below Average  Language 101 53 Average  Attention 82 12 Below Average  Delayed Memory 78 7 Impaired  Total Score 81 10 Below Average   RBANS Subtests Raw Score Percentile Description  List Learning 20 9 Below Average  Story Memory 12 7 Impaired  Figure Copy 14 2 Profoundly Impaired  Line Orientation 19 82 Above Average  Picture Naming 10 70 Average  Semantic Fluency 20 50 Average  Digit Span 10 42 Average  Coding 22 2 Profoundly Impaired  List Recall 1 5 Impaired  List Recognition 18 16 Below Average  Story Recall 5 3 Impaired  Figure  recall 10 27 Average   GDS (short) Raw Score = 11-12 Description = Moderate   GAI Raw Score = 13 Description = Mild-Moderate  [name]' score on an embedded measure of performance validity was within normal limits.  Moreover, there were no behavioral observations to suggest suboptimal effort.  Therefore, the current results likely represent an accurate representation of her current level of cognitive functioning.    [name]' performances across cognitive tasks were variable, with significant difficulty seen in aspects of learning and memory, as well as on certain visuoconstruction tasks.  However, of note, she stated that she has double vision, which likely adversely impacted her performances on tasks requiring rapid perception or drawing.  When given ample time, it appeared as though her visuospatial skills were intact, as evidenced by her above average performance on a measure on which she was asked to identify line positions and angles.  Regarding memory, [name] demonstrated difficulty with initial ability to encode novel information presented auditorially and she did not seem to benefit significantly from repeated exposure to the novel information.  Following delays, her ability to freely recall studied information was poor, though she benefitted significantly from provision of recognition cues.  Of note, her incidental memory for complex visual information was intact.  [name] demonstrated intact language skills and simple attention.   From an emotional standpoint, [name]' responses to self-report measures of mood symptoms were suggestive of the presence of moderate levels of depression and anxiety at this time.  Of note, she commented that she has never struggled with low mood or anxiety prior to this hospitalization and she also mentioned that she recognizes that "life will go on" and there  is "not much she can do about [her stressors]" in this moment, but that she does think about  things that need to get done (e.g. her taxes, renewing her driver's license, etc.).    IMPRESSION:  [name]' overall neurocognitive profile was reflective of cognitive impairments, particularly in aspects of auditory learning and memory.  Owing to the description of her stroke, including right cerebellar edema with mass effect, it is possible that her memory is being impacted as a secondary result of mass effect (pressure on the left temporal lobe).  However, we cannot rule out contribution from a neurodegenerative condition as the cause of her memory inefficiencies.  More comprehensive neuropsychological testing as an outpatient may be useful in clarifying the etiology of her deficits.  Alternatively, monitoring her cognitive progress over time and analyzing the course of her symptoms may be beneficial in determining the cause of her current cognitive difficulties.  In addition, she seems to be experiencing clinically significant depression and anxiety that could be exacerbating her cognitive difficulties, but which are not likely the driving forces behind her cognitive changes.    In light of these findings, the following recommendations are provided.    RECOMMENDATIONS:  Recommendations for treatment team:     When interacting with [name], directions and information should be provided in a simple, straight forward manner, and the treatment team should avoid giving multiple instructions simultaneously.    Owing to [name]' strength in visual memory, she may find benefit in writing down and visually studying how important information looks on a paper in order to improve her ability to recall the information later.  Use of varied colored pens when writing down important details may also aid in activating visual memory to help her recall the information later.     Since emotional factors are likely adversely impacting her daily life, brief counseling for social support seems warranted during  this hospitalization.    To the extent possible, multitasking should be avoided.   Performance will generally be best in a structured, routine, and familiar environment, as opposed to situations involving complex problems.   Recommendations for discharge planning:    Complete a comprehensive neuropsychological evaluation as an outpatient for more detailed evaluation of her cognitive functioning and to assist in differential diagnosis and treatment planning.    [name] demonstrated intact attention, though her ability for visual processing was slow.  Therefore, once she is physically cleared to drive, she may want to begin by limiting her driving to daytime hours and familiar locations for safety reasons.     Given the noted memory inefficiencies, [name] may benefit from use of audible reminders to help her with compliance with medications and appointments.     Maintain engagement in mentally, physically and cognitively stimulating activities.    Strive to maintain a healthy lifestyle (e.g., proper diet and exercise) in order to promote physical, cognitive and emotional health.    Leavy CellaKaren Kaius Daino, Psy.D.  Clinical Neuropsychologist

## 2014-02-20 NOTE — Telephone Encounter (Signed)
I called and submitted the PA request via phone.  Is pending review [id number]

## 2014-03-01 ENCOUNTER — Encounter: Payer: Self-pay | Admitting: Internal Medicine

## 2014-03-01 ENCOUNTER — Ambulatory Visit (INDEPENDENT_AMBULATORY_CARE_PROVIDER_SITE_OTHER): Payer: Medicare Other | Admitting: Internal Medicine

## 2014-03-01 VITALS — BP 122/76 | HR 74 | Temp 98.4°F | Resp 20 | Ht 64.5 in | Wt 186.0 lb

## 2014-03-01 DIAGNOSIS — I4891 Unspecified atrial fibrillation: Secondary | ICD-10-CM

## 2014-03-01 DIAGNOSIS — I10 Essential (primary) hypertension: Secondary | ICD-10-CM

## 2014-03-01 DIAGNOSIS — I6350 Cerebral infarction due to unspecified occlusion or stenosis of unspecified cerebral artery: Secondary | ICD-10-CM

## 2014-03-01 DIAGNOSIS — F3289 Other specified depressive episodes: Secondary | ICD-10-CM

## 2014-03-01 DIAGNOSIS — F329 Major depressive disorder, single episode, unspecified: Secondary | ICD-10-CM

## 2014-03-01 DIAGNOSIS — I639 Cerebral infarction, unspecified: Secondary | ICD-10-CM

## 2014-03-01 DIAGNOSIS — G911 Obstructive hydrocephalus: Secondary | ICD-10-CM

## 2014-03-01 DIAGNOSIS — E785 Hyperlipidemia, unspecified: Secondary | ICD-10-CM

## 2014-03-01 DIAGNOSIS — F411 Generalized anxiety disorder: Secondary | ICD-10-CM

## 2014-03-01 NOTE — Progress Notes (Signed)
Subjective:    Patient ID: Tammy Barron, female    DOB: [birth year], 73 y.o.   MRN: [id number]  HPI  Admit date: 01/31/2014  Discharge date: 02/06/2014   Recommendations for Outpatient Follow-up:  1. F/y BP daily and adjust meds as needed 2. Repeat head CT on Friday and call Dr Pearlean BrownieSethi - he will decide whether to start on anticoagulation for A-fib- cont ASA for now 3. D/c to CIR 4. Neuro to advise when to start anticoagulation for A-fib  Discharge Diagnoses:   Principal Problem:  Cerebellar stroke   Active Problems:  HYPERLIPIDEMIA  ANXIETY  HYPERTENSION  Obstructive hydrocephalus  Atrial fibrillation  Hypernatremia, induced  Obesity, unspecified  Tobacco use disorder   73 year old patient who has a history of hypertension and dyslipidemia and tobacco use.  She was admitted to the hospital one month ago after presenting with vertigo and gait instability.  She was determined to have a right PICA cerebellar stroke and subsequently was transferred to CIR.  Presently, she is receiving home physical therapy.  She continues to live independently, and inflammatory with a walker.  She still is having positional vertigo and unable to drive. Hospital course was calculated by brief paroxysmal atrial for ablation.  At the present time, she is on aspirin, antiplatelet therapy.  Inpatient hospital and rehabilitation records reviewed  Past Medical History  Diagnosis Date  . Anxiety   . Depression   . Hypertension   . Hyperlipidemia   . Hx of abnormal Pap smear     History   Social History  . Marital Status: Widowed    Spouse Name: N/A    Number of Children: N/A  . Years of Education: N/A   Occupational History  . Not on file.   Social History Main Topics  . Smoking status: Current Some Day Smoker -- 1.00 packs/day    Types: Cigarettes  . Smokeless tobacco: Never Used  . Alcohol Use: No  . Drug Use: Not on file  . Sexual Activity: Not on file   Other Topics Concern  .  Not on file   Social History Narrative  . No narrative on file    Past Surgical History  Procedure Laterality Date  . Abdominal hysterectomy  1982    Family History  Problem Relation Age of Onset  . Heart disease Mother   . Heart attack Father     Allergies  Allergen Reactions  . Latex Rash    Current Outpatient Prescriptions on File Prior to Visit  Medication Sig Dispense Refill  . aspirin 325 MG tablet Take 1 tablet (325 mg total) by mouth daily.  30 tablet  0  . atorvastatin (LIPITOR) 10 MG tablet Take 1 tablet (10 mg total) by mouth daily at 6 PM.  30 tablet  1  . bisoprolol-hydrochlorothiazide (ZIAC) 10-6.25 MG per tablet Take 1 tablet by mouth daily.      . clorazepate (TRANXENE) 3.75 MG tablet Take 1 tablet (3.75 mg total) by mouth 2 (two) times daily as needed for anxiety.  30 tablet  5  . cycloSPORINE (RESTASIS) 0.05 % ophthalmic emulsion Place 1 drop into both eyes 2 (two) times daily.      Marland Kitchen. diltiazem (CARDIZEM CD) 120 MG 24 hr capsule Take 1 capsule (120 mg total) by mouth daily.  30 capsule  1  . guaiFENesin (MUCINEX) 600 MG 12 hr tablet Take 1 tablet (600 mg total) by mouth 2 (two) times daily.      .Marland Kitchen  imipramine (TOFRANIL) 50 MG tablet Take 1 tablet (50 mg total) by mouth at bedtime. For hyperactive bladder  30 tablet  1  . Multiple Vitamin (MULTIVITAMIN) tablet Take 1 tablet by mouth daily.      . polyethylene glycol (MIRALAX / GLYCOLAX) packet Take 17 g by mouth daily. For constipation  14 each  0  . potassium chloride (K-DUR,KLOR-CON) 10 MEQ tablet Take 1 tablet (10 mEq total) by mouth 2 (two) times daily.  60 tablet  1   No current facility-administered medications on file prior to visit.    BP 122/76  Pulse 74  Temp(Src) 98.4 F (36.9 C) (Oral)  Resp 20  Ht 5' 4.5" (1.638 m)  Wt 186 lb (84.369 kg)  BMI 31.45 kg/m2  SpO2 97%     Review of Systems  HENT: Negative for congestion, dental problem, hearing loss, rhinorrhea, sinus pressure, sore throat  and tinnitus.   Eyes: Negative for pain, discharge and visual disturbance.  Respiratory: Negative for cough and shortness of breath.   Cardiovascular: Negative for chest pain, palpitations and leg swelling.  Gastrointestinal: Negative for nausea, vomiting, abdominal pain, diarrhea, constipation, blood in stool and abdominal distention.  Genitourinary: Negative for dysuria, urgency, frequency, hematuria, flank pain, vaginal bleeding, vaginal discharge, difficulty urinating, vaginal pain and pelvic pain.  Musculoskeletal: Positive for gait problem. Negative for arthralgias and joint swelling.  Skin: Negative for rash.  Neurological: Positive for dizziness, weakness, light-headedness and headaches. Negative for syncope, speech difficulty and numbness.  Hematological: Negative for adenopathy.  Psychiatric/Behavioral: Negative for behavioral problems, dysphoric mood and agitation. The patient is not nervous/anxious.        Objective:   Physical Exam  Constitutional: She is oriented to person, place, and time. She appears well-developed and well-nourished.  Blood pressure 130/80  HENT:  Head: Normocephalic.  Right Ear: External ear normal.  Left Ear: External ear normal.  Mouth/Throat: Oropharynx is clear and moist.  Eyes: Conjunctivae and EOM are normal. Pupils are equal, round, and reactive to light.  Neck: Normal range of motion. Neck supple. No thyromegaly present.  Cardiovascular: Normal rate, regular rhythm, normal heart sounds and intact distal pulses.   Pulmonary/Chest: Effort normal and breath sounds normal.  Abdominal: Soft. Bowel sounds are normal. She exhibits no mass. There is no tenderness.  Musculoskeletal: Normal range of motion.  Lymphadenopathy:    She has no cervical adenopathy.  Neurological: She is alert and oriented to person, place, and time.  Uses a 4. Walker Brief vertigo with some movement such as head turning  Finger-to-nose heel-to-shin testing performed well    Skin: Skin is warm and dry. No rash noted.  Psychiatric: She has a normal mood and affect. Her behavior is normal.          Assessment & Plan:    status post cerebellar stroke.  Followup neurology Hypertension controlled.  Continue present regimen Dyslipidemia.  Continue atorvastatin Paroxysmal atrial fibrillation.  Remains in normal sinus rhythm  Recheck 3 months

## 2014-03-01 NOTE — Patient Instructions (Signed)
Limit your sodium (Salt) intake  Please check your blood pressure on a regular basis.  If it is consistently greater than 150/90, please make an office appointment.  Return in 3 months for follow-up  Neurology followup as scheduled

## 2014-03-01 NOTE — Progress Notes (Signed)
Pre-visit discussion using our clinic review tool. No additional management support is needed unless otherwise documented below in the visit note.  

## 2014-03-02 ENCOUNTER — Telehealth: Payer: Self-pay | Admitting: Internal Medicine

## 2014-03-02 NOTE — Telephone Encounter (Signed)
Relevant patient education assigned to patient using Emmi. ° °

## 2014-03-23 ENCOUNTER — Encounter: Payer: Self-pay | Admitting: Physical Medicine & Rehabilitation

## 2014-03-23 ENCOUNTER — Encounter: Payer: Medicare Other | Attending: Physical Medicine & Rehabilitation

## 2014-03-23 ENCOUNTER — Ambulatory Visit (HOSPITAL_BASED_OUTPATIENT_CLINIC_OR_DEPARTMENT_OTHER): Payer: Medicare Other | Admitting: Physical Medicine & Rehabilitation

## 2014-03-23 VITALS — BP 134/100 | HR 94 | Resp 14 | Ht 64.0 in | Wt 183.0 lb

## 2014-03-23 DIAGNOSIS — I10 Essential (primary) hypertension: Secondary | ICD-10-CM | POA: Insufficient documentation

## 2014-03-23 DIAGNOSIS — I69993 Ataxia following unspecified cerebrovascular disease: Secondary | ICD-10-CM

## 2014-03-23 DIAGNOSIS — I4891 Unspecified atrial fibrillation: Secondary | ICD-10-CM | POA: Insufficient documentation

## 2014-03-23 DIAGNOSIS — F17200 Nicotine dependence, unspecified, uncomplicated: Secondary | ICD-10-CM | POA: Insufficient documentation

## 2014-03-23 DIAGNOSIS — E785 Hyperlipidemia, unspecified: Secondary | ICD-10-CM | POA: Insufficient documentation

## 2014-03-23 NOTE — Patient Instructions (Signed)
Graduated return to driving instructions were provided. It is recommended that the patient first drives with another licensed driver in an empty parking lot. If the patient does well with this, and they can drive on a quiet street with the licensed driver. If the patient does well with this they can drive on a busy street with a licensed driver. If the patient does well with this, the next time out they can go by himself. For the first month after resuming driving, I recommend no nighttime or Interstate driving.  Referral to [place] [facility], they will call you to set up an appointment  May resume silver sneakers after you complete physical therapy as an outpatient  See me on an as needed basis

## 2014-03-23 NOTE — Progress Notes (Signed)
Subjective:    Patient ID: Tammy Barron, female    DOB: [birth year], 73 y.o.   MRN: [id number] Tammy Barron is a 73 y.o. female with history of HTN, anxiety/depression, tobacco abuse who was admitted on 01/31/14 with two day history of dizziness, N &V progressing to difficulty walking.  MRI/MRA brain with acute R-PICA infarct in cerebellum with edema and mass effect on 4th ventricle without hydro and occlusion of R-PICA c/w acute infarct. She was started on hypertonic saline for dema control and ASA 325 mg for secondary stroke prevention. She was started on IV Cardizem for new onset A fib and fluid overload with wheezing and BLE edema was treated with diuretics  HPI Admit date: 02/06/2014 Discharge date: 02/16/2014  Completed home health PT and OT Independent with dressing bathing grooming hygiene as well as ambulation   Pain Inventory Average Pain 0 Pain Right Now 0 My pain is no pain  In the last 24 hours, has pain interfered with the following? General activity 0 Relation with others 0 Enjoyment of life 0 What TIME of day is your pain at its worst? no pain Sleep (in general) Good  Pain is worse with: na Pain improves with: na Relief from Meds: no pain meds  Mobility walk without assistance walk with assistance use a cane ability to climb steps?  yes do you drive?  no transfers alone Do you have any goals in this area?  yes  Function retired Do you have any goals in this area?  no  Neuro/Psych No problems in this area  Prior Studies Any changes since last visit?  no  Physicians involved in your care Any changes since last visit?  no   Family History  Problem Relation Age of Onset  . Heart disease Mother   . Heart attack Father    History   Social History  . Marital Status: Widowed    Spouse Name: N/A    Number of Children: N/A  . Years of Education: N/A   Social History Main Topics  . Smoking status: Current Some Day Smoker -- 1.00 packs/day     Types: Cigarettes  . Smokeless tobacco: Never Used  . Alcohol Use: No  . Drug Use: None  . Sexual Activity: None   Other Topics Concern  . None   Social History Narrative  . None   Past Surgical History  Procedure Laterality Date  . Abdominal hysterectomy  1982   Past Medical History  Diagnosis Date  . Anxiety   . Depression   . Hypertension   . Hyperlipidemia   . Hx of abnormal Pap smear    BP 134/100  Pulse 94  Resp 14  Ht 5\' 4"  (1.626 m)  Wt 183 lb (83.008 kg)  BMI 31.40 kg/m2  SpO2 97%  Opioid Risk Score:   Fall Risk Score: Moderate Fall Risk (6-13 points) (pt educated on fall risk, brochure given to pt)    Review of Systems  Constitutional: Positive for unexpected weight change.  All other systems reviewed and are negative.      Objective:   Physical Exam  Nursing note and vitals reviewed. Constitutional: She is oriented to person, place, and time. She appears well-developed and well-nourished.  HENT:  Head: Normocephalic and atraumatic.  Eyes: Conjunctivae and EOM are normal. Pupils are equal, round, and reactive to light.  Neurological: She is alert and oriented to person, place, and time. No sensory deficit. Gait abnormal. Coordination normal.  Reflex Scores:  Tricep reflexes are 1+ on the right side and 1+ on the left side.      Bicep reflexes are 1+ on the right side and 1+ on the left side.      Brachioradialis reflexes are 1+ on the right side and 1+ on the left side.      Patellar reflexes are 1+ on the right side and 1+ on the left side.      Achilles reflexes are 2+ on the right side and 2+ on the left side. Gait no toe drag or knee instability mildly increase base of support  Visual fields are intact confrontation testing No evidence of nystagmus Extraocular muscles are intact  Motor strength 5/5 bilateral deltoid, bicep, tricep, grip, hip flexor, knee extensors, ankle dorsiflexor plantar flexor  Psychiatric: She has a normal  mood and affect.          Assessment & Plan:  1. Right posterior inferior cerebellar artery infarct with residual gait ataxia referral to PT   Graduated return to driving instructions were provided. It is recommended that the patient first drives with another licensed driver in an empty parking lot. If the patient does well with this, and they can drive on a quiet street with the licensed driver. If the patient does well with this they can drive on a busy street with a licensed driver. If the patient does well with this, the next time out they can go by himself. For the first month after resuming driving, I recommend no nighttime or Interstate driving.  Referral to [place] [facility], they will call you to set up an appointment  May resume silver sneakers after you complete physical therapy as an outpatient  See me on an as needed basis

## 2014-04-10 ENCOUNTER — Other Ambulatory Visit (HOSPITAL_COMMUNITY): Payer: Self-pay | Admitting: Physical Medicine and Rehabilitation

## 2014-04-16 ENCOUNTER — Encounter: Payer: Self-pay | Admitting: Nurse Practitioner

## 2014-04-16 ENCOUNTER — Ambulatory Visit (INDEPENDENT_AMBULATORY_CARE_PROVIDER_SITE_OTHER): Payer: Medicare Other | Admitting: Nurse Practitioner

## 2014-04-16 VITALS — BP 124/70 | HR 69 | Temp 97.2°F | Ht 64.0 in | Wt 184.0 lb

## 2014-04-16 DIAGNOSIS — I6529 Occlusion and stenosis of unspecified carotid artery: Secondary | ICD-10-CM

## 2014-04-16 DIAGNOSIS — I639 Cerebral infarction, unspecified: Secondary | ICD-10-CM

## 2014-04-16 DIAGNOSIS — I4891 Unspecified atrial fibrillation: Secondary | ICD-10-CM

## 2014-04-16 DIAGNOSIS — I6350 Cerebral infarction due to unspecified occlusion or stenosis of unspecified cerebral artery: Secondary | ICD-10-CM

## 2014-04-16 MED ORDER — RIVAROXABAN 10 MG PO TABS: 10.0000 mg | ORAL_TABLET | Freq: Every day | ORAL | Status: DC

## 2014-04-16 MED ORDER — ATORVASTATIN CALCIUM 10 MG PO TABS: 10.0000 mg | ORAL_TABLET | Freq: Every day | ORAL | Status: DC

## 2014-04-16 NOTE — Progress Notes (Signed)
I agree with above 

## 2014-04-16 NOTE — Patient Instructions (Addendum)
Start Xarelto orally every day  for atrial fibrillation and as secondary stroke prevention and maintain strict control of hypertension with blood pressure goal below 140/90, and lipids with LDL cholesterol goal below 100 mg/dL.  Risk factor modification - tobacco cessation.    Stop Aspirin.  Recheck carotid dopplers. Followup in 2 months, sooner as needed.    Rivaroxaban oral tablets What is this medicine? RIVAROXABAN (ri va ROX a ban) is an anticoagulant (blood thinner). It is used to treat blood clots in the lungs or in the veins. It is also used after knee or hip surgeries to prevent blood clots. It is also used to lower the chance of stroke in people with a medical condition called atrial fibrillation. This medicine may be used for other purposes; ask your health care provider or pharmacist if you have questions. COMMON BRAND NAME(S): Xarelto What should I tell my health care provider before I take this medicine? They need to know if you have any of these conditions: -bleeding disorders -bleeding in the brain -blood in your stools (black or tarry stools) or if you have blood in your vomit -history of stomach bleeding -kidney disease -liver disease -low blood counts, like low white cell, platelet, or red cell counts -recent or planned spinal or epidural procedure -take medicines that treat or prevent blood clots -an unusual or allergic reaction to rivaroxaban, other medicines, foods, dyes, or preservatives -pregnant or trying to get pregnant -breast-feeding How should I use this medicine? Take this medicine by mouth with a glass of water. Follow the directions on the prescription label. Take your medicine at regular intervals. Do not take it more often than directed. Do not stop taking except on your doctor's advice. Stopping this medicine may increase your risk of a blot clot. Be sure to refill your prescription before you run out of medicine. If you are taking this medicine after  hip or knee replacement surgery, take it with or without food. If you are taking this medicine for atrial fibrillation, take it with your evening meal. If you are taking this medicine to treat blood clots, take it with food at the same time each day. If you are unable to swallow your tablet, you may crush the tablet and mix it in applesauce. Then, immediately eat the applesauce. You should eat more food right after you eat the applesauce containing the crushed tablet. Talk to your pediatrician regarding the use of this medicine in children. Special care may be needed. Overdosage: If you think you have taken too much of this medicine contact a poison control center or emergency room at once. NOTE: This medicine is only for you. Do not share this medicine with others. What if I miss a dose? If you take your medicine once a day and miss a dose, take the missed dose as soon as you remember. If you take your medicine twice a day and miss a dose, take the missed dose immediately. In this instance, 2 tablets may be taken at the same time. The next day you should take 1 tablet twice a day as directed. What may interact with this medicine? -aspirin and aspirin-like medicines -certain antibiotics like erythromycin, azithromycin, and clarithromycin -certain medicines for fungal infections like ketoconazole and itraconazole -certain medicines for irregular heart beat like amiodarone, quinidine, dronedarone -certain medicines for seizures like carbamazepine, phenytoin -certain medicines that treat or prevent blood clots like warfarin, enoxaparin, and dalteparin  -conivaptan -diltiazem -felodipine -indinavir -lopinavir; ritonavir -NSAIDS, medicines for pain and  inflammation, like ibuprofen or naproxen -ranolazine -rifampin -ritonavir -St. John's wort -verapamil This list may not describe all possible interactions. Give your health care provider a list of all the medicines, herbs, non-prescription drugs, or  dietary supplements you use. Also tell them if you smoke, drink alcohol, or use illegal drugs. Some items may interact with your medicine. What should I watch for while using this medicine? Visit your doctor or health care professional for regular checks on your progress. Your condition will be monitored carefully while you are receiving this medicine. Notify your doctor or health care professional and seek emergency treatment if you develop breathing problems; changes in vision; chest pain; severe, sudden headache; pain, swelling, warmth in the leg; trouble speaking; sudden numbness or weakness of the face, arm, or leg. These can be signs that your condition has gotten worse. If you are going to have surgery, tell your doctor or health care professional that you are taking this medicine. Tell your health care professional that you use this medicine before you have a spinal or epidural procedure. Sometimes people who take this medicine have bleeding problems around the spine when they have a spinal or epidural procedure. This bleeding is very rare. If you have a spinal or epidural procedure while on this medicine, call your health care professional immediately if you have back pain, numbness or tingling (especially in your legs and feet), muscle weakness, paralysis, or loss of bladder or bowel control. Avoid sports and activities that might cause injury while you are using this medicine. Severe falls or injuries can cause unseen bleeding. Be careful when using sharp tools or knives. Consider using an Neurosurgeonelectric razor. Take special care brushing or flossing your teeth. Report any injuries, bruising, or red spots on the skin to your doctor or health care professional. What side effects may I notice from receiving this medicine? Side effects that you should report to your doctor or health care professional as soon as possible: -allergic reactions like skin rash, itching or hives, swelling of the face, lips, or  tongue -back pain -redness, blistering, peeling or loosening of the skin, including inside the mouth -signs and symptoms of bleeding such as bloody or black, tarry stools; red or dark-brown urine; spitting up blood or brown material that looks like coffee grounds; red spots on the skin; unusual bruising or bleeding from the eye, gums, or nose  Side effects that usually do not require medical attention (Report these to your doctor or health care professional if they continue or are bothersome.): -dizziness -muscle pain This list may not describe all possible side effects. Call your doctor for medical advice about side effects. You may report side effects to FDA at 1-800-FDA-1088. Where should I keep my medicine? Keep out of the reach of children. Store at room temperature between 15 and 30 degrees C (59 and 86 degrees F). Throw away any unused medicine after the expiration date. NOTE: This sheet is a summary. It may not cover all possible information. If you have questions about this medicine, talk to your doctor, pharmacist, or health care provider.  2014, Elsevier/Gold Standard. (2013-04-05 09:51:31)   Stroke Prevention Some medical conditions and behaviors are associated with an increased chance of having a stroke. You may prevent a stroke by making healthy choices and managing medical conditions. HOW CAN I REDUCE MY RISK OF HAVING A STROKE?   Stay physically active. Get at least 30 minutes of activity on most or all days.  Do not smoke. It  may also be helpful to avoid exposure to secondhand smoke.  Limit alcohol use. Moderate alcohol use is considered to be:  No more than 2 drinks per day for men.  No more than 1 drink per day for nonpregnant women.  Eat healthy foods. This involves  Eating 5 or more servings of fruits and vegetables a day.  Following a diet that addresses high blood pressure (hypertension), high cholesterol, diabetes, or obesity.  Manage your cholesterol  levels.  A diet low in saturated fat, trans fat, and cholesterol and high in fiber may control cholesterol levels.  Take any prescribed medicines to control cholesterol as directed by your health care provider.  Manage your diabetes.  A controlled-carbohydrate, controlled-sugar diet is recommended to manage diabetes.  Take any prescribed medicines to control diabetes as directed by your health care provider.  Control your hypertension.  A low-salt (sodium), low-saturated fat, low-trans fat, and low-cholesterol diet is recommended to manage hypertension.  Take any prescribed medicines to control hypertension as directed by your health care provider.  Maintain a healthy weight.  A reduced-calorie, low-sodium, low-saturated fat, low-trans fat, low-cholesterol diet is recommended to manage weight.  Stop drug abuse.  Avoid taking birth control pills.  Talk to your health care provider about the risks of taking birth control pills if you are over 75 years old, smoke, get migraines, or have ever had a blood clot.  Get evaluated for sleep disorders (sleep apnea).  Talk to your health care provider about getting a sleep evaluation if you snore a lot or have excessive sleepiness.  Take medicines as directed by your health care provider.  For some people, aspirin or blood thinners (anticoagulants) are helpful in reducing the risk of forming abnormal blood clots that can lead to stroke. If you have the irregular heart rhythm of atrial fibrillation, you should be on a blood thinner unless there is a good reason you cannot take them.  Understand all your medicine instructions.  Make sure that other other conditions (such as anemia or atherosclerosis) are addressed. SEEK IMMEDIATE MEDICAL CARE IF:   You have sudden weakness or numbness of the face, arm, or leg, especially on one side of the body.  Your face or eyelid droops to one side.  You have sudden confusion.  You have trouble  speaking (aphasia) or understanding.  You have sudden trouble seeing in one or both eyes.  You have sudden trouble walking.  You have dizziness.  You have a loss of balance or coordination.  You have a sudden, severe headache with no known cause.  You have new chest pain or an irregular heartbeat. Any of these symptoms may represent a serious problem that is an emergency. Do not wait to see if the symptoms will go away. Get medical help at once. Call your local emergency services  (911 in [place] Do not drive yourself to the hospital. Document Released: 11/26/2004 Document Revised: 08/09/2013 Document Reviewed: 04/21/2013 [facility] Patient Information 2014 [place]Care, [place].

## 2014-04-16 NOTE — Progress Notes (Signed)
PATIENT: Tammy Barron DOB: 1941/05/15  REASON FOR VISIT: hospital follow up for stroke HISTORY FROM: patient  HISTORY OF PRESENT ILLNESS: Tammy Barron is a 73 y.o. female with history of HTN, anxiety/depression, tobacco abuse who was admitted to [facility] on 01/[facility]/14 with two day history of dizziness, N&V progressing to difficulty walking.  She was determined to have a right PICA cerebellar stroke and an embolic cause was suspected.  She was found to be in atrial fibrillation during the hospitalization and was started on aspirin daily and Cardizem for rate control.  2D Echocardiogram showed an ejection fraction of 55-60%. No cardiac source of emboli identified.  Carotid Doppler showed a Right 60-79% ICA stenosis; left could not be visualized due to central line. She continues to live independently ambulates without any assistive device. She still is having positional vertigo occasionally. She completed home health PT and OT. She was cleared to drive by Dr. Larna DaughtersKirstens.  She comes in today for first stroke followup office visit.  She states that her blood pressure is well controlled, it is 124/70 in the office today.  She states her right eye vision is blurred since the stroke.  She has not had this evaluated yet.   She continues to smoke some.  She quit taking her statin after the refills ran out.  REVIEW OF SYSTEMS: Full 14 system review of systems performed and notable only for: hearing loss, easy bruising, allergies, memory loss, frequent urination.  ALLERGIES: Allergies  Allergen Reactions  . Latex Rash    HOME MEDICATIONS: Outpatient Prescriptions Prior to Visit  Medication Sig Dispense Refill  . bisoprolol-hydrochlorothiazide (ZIAC) 10-6.25 MG per tablet Take 1 tablet by mouth daily.      Marland Kitchen. diltiazem (CARDIZEM CD) 120 MG 24 hr capsule Take 1 capsule (120 mg total) by mouth daily.  30 capsule  1  . Multiple Vitamin (MULTIVITAMIN) tablet Take 1 tablet by mouth daily.      .  polyethylene glycol (MIRALAX / GLYCOLAX) packet Take 17 g by mouth daily. For constipation  14 each  0  . potassium chloride (K-DUR,KLOR-CON) 10 MEQ tablet Take 1 tablet (10 mEq total) by mouth 2 (two) times daily.  60 tablet  1  . aspirin 325 MG tablet Take 1 tablet (325 mg total) by mouth daily.  30 tablet  0  . clorazepate (TRANXENE) 3.75 MG tablet Take 1 tablet (3.75 mg total) by mouth 2 (two) times daily as needed for anxiety.  30 tablet  5  . atorvastatin (LIPITOR) 10 MG tablet Take 1 tablet (10 mg total) by mouth daily at 6 PM.  30 tablet  1  . cycloSPORINE (RESTASIS) 0.05 % ophthalmic emulsion Place 1 drop into both eyes 2 (two) times daily.      Marland Kitchen. guaiFENesin (MUCINEX) 600 MG 12 hr tablet Take 1 tablet (600 mg total) by mouth 2 (two) times daily.      Marland Kitchen. imipramine (TOFRANIL) 50 MG tablet Take 1 tablet (50 mg total) by mouth at bedtime. For hyperactive bladder  30 tablet  1   No facility-administered medications prior to visit.    PHYSICAL EXAM  Filed Vitals:   04/16/14 1020  BP: 124/70  Pulse: 69  Temp: 97.2 F (36.2 C)  TempSrc: Oral  Height: 5\' 4"  (1.626 m)  Weight: 184 lb (83.462 kg)   Body mass index is 31.57 kg/(m^2). No exam data present  Generalized: Well developed, in no acute distress  Head: normocephalic and atraumatic. Oropharynx  benign  Neck: Supple, no carotid bruits  Cardiac: Regular rate rhythm, no murmur  Musculoskeletal: No deformity   Neurological examination  Mentation: Alert oriented to time, place, history taking. Follows all commands speech and language fluent Cranial nerve II-XII: Fundoscopic exam reveals sharp disc margins. Pupils were equal round reactive to light extraocular movements were full, visual field were full on confrontational test. No nystagmus noted.  Facial sensation and strength were normal. hearing was intact to finger rubbing bilaterally. Uvula tongue midline. head turning and shoulder shrug and were normal and symmetric.Tongue  protrusion into cheek strength was normal. Motor: The motor testing reveals 5 over 5 strength of all 4 extremities. Good symmetric motor tone is noted throughout.  Sensory: Sensory testing is intact to soft touch on all 4 extremities. No evidence of extinction is noted.  Coordination: Cerebellar testing reveals good finger-nose-finger and heel-to-shin bilaterally.  Gait and station: Gait is normal, except for slight limp due to chronic ankle pain.  Tandem gait is unsteady. Romberg is negative.  Reflexes: Deep tendon reflexes are symmetric and normal bilaterally.   DIAGNOSTIC DATA (LABS, IMAGING, TESTING) - I reviewed patient records, labs, notes, testing and imaging myself where available.  Lab Results  Component Value Date   HGBA1C 5.3 02/01/2014   Lab Results  Component Value Date   TSH 1.830 02/04/2014    ASSESSMENT: 73 y.o. year old female  has a past medical history of Anxiety; Depression; Hypertension and Hyperlipidemia who suffered a right PICA cerebellar stroke in April 2015.  Stroke due to newly diagnosed Paroxysmal Atrial Fibrillation.  I recommended she change aspirin to Xarelto for anticoagulation due to increased risk of another stroke due to PAF.  She is agreeable to switch to Xarelto, risks vs.benefits discussed. I am referring her to Cardiology, for management of PAF, (she was started on Cardizem in hospital as well).  PLAN: I had a long discussion with the patient and family regarding her recent stroke, discussed results of evaluation in the hospital and answered questions.  Start  xarelto ( rivaroxaban) for atrial fibrillation as secondary stroke prevention and stop aspirin.  Advised not to take NSAIDS such as Ibuprofen or Aleve for pain.  Tylenol or Tramadol instead.   Maintain strict control of hypertension with blood pressure goal below 130/90, and lipids with LDL cholesterol goal below 100 mg/dL.   Referral to Cardiology for management of newly diagnosed Paroxysmal Atrial  Fibrillation. Recommeded restarting atorvastatin 10 mg daily, for LDL goal <100. Last LDL was 126. Recommended tobacco cessation.   Recheck carotid dopplers. Followup in 2 months, sooner as needed.  Orders Placed This Encounter  Procedures  . US Carotid Bilateral  . Ambulatory referral to Cardiology   Meds ordered this encounter  Medications  . rivaroxaban (XARELTO) 10 MG TABS tablet    Sig: Take 1 tablet (10 mg total) by mouth daily.    Dispense:  30 tablet    Refill:  5    Order Specific Question:  Supervising Provider    Answer:  Joycelyn SchmidPENUMALLI, VIKRAM R [[id number]]  . atorvastatin (LIPITOR) 10 MG tablet    Sig: Take 1 tablet (10 mg total) by mouth daily at 6 PM.    Dispense:  30 tablet    Refill:  5    Order Specific Question:  Supervising Provider    Answer:  Suanne MarkerPENUMALLI, VIKRAM R [[id number]]   Return in about 2 months (around 06/16/2014) for stroke follow up.  Tawny AsalLYNN E. Tenae Graziosi, MSN, NP-C 04/16/2014, 1:28 PM [facility] [address], [place], [place] [address] [phone]  Note: This document was prepared with digital dictation and possible smart phrase technology. Any transcriptional errors that result from this process are unintentional.

## 2014-04-17 ENCOUNTER — Telehealth: Payer: Self-pay | Admitting: Internal Medicine

## 2014-04-17 ENCOUNTER — Telehealth: Payer: Self-pay | Admitting: Nurse Practitioner

## 2014-04-17 ENCOUNTER — Other Ambulatory Visit: Payer: Self-pay | Admitting: Internal Medicine

## 2014-04-17 ENCOUNTER — Other Ambulatory Visit (HOSPITAL_COMMUNITY): Payer: Self-pay | Admitting: Physical Medicine and Rehabilitation

## 2014-04-17 ENCOUNTER — Telehealth: Payer: Self-pay | Admitting: Radiology

## 2014-04-17 ENCOUNTER — Telehealth: Payer: Self-pay | Admitting: *Deleted

## 2014-04-17 NOTE — Telephone Encounter (Signed)
Patient was called about the referral to the cardiologist which is scheduled for April 20, 2014 at 2:15 pm and her arrival time of 1:45 pm. Patient stated she did not want to start seeing a ream of doctors because she was in her "exit plan."  She asked did I know what she meant and I stated no, so she replied I am 73 years old and my life will soon be over and I'm not getting ready to start seeing all these different doctors and running bills up.  So I explained to the patient that she needed to see the cardiologist because of the Cardizem that was just started in the hospital and she agreed to go to the referral on Friday.

## 2014-04-17 NOTE — Telephone Encounter (Addendum)
Pt request refill of  bisoprolol-hydrochlorothiazide (ZIAC) 10-6.25 MG per tablet 90 day traMADol (ULTRAM) 50 MG tablet 90 day (pt takes this sparatically) Pt wouldln iem to confirm uyoiu still want her on the ziac [facility] [address]  Pt states she is out of meds.

## 2014-04-17 NOTE — Telephone Encounter (Signed)
Pt called requesting which medications she is supposed to be taking I advised her from reading office visit notes she is to take  rivaroxaban (XARELTO) 10 MG TABS tablet 30 tablet 5 04/16/2014 Take 1 tablet (10 mg total) by mouth daily. - Oral  atorvastatin (LIPITOR) 10 MG tablet 30 tablet 5 04/16/2014 Take 1 tablet (10 mg total) by mouth daily at 6 PM. - Oral  Pt states she is out of her potassium chloride (K-DUR,KLOR-CON) 10 MEQ tablet & her diltiazem (CARDIZEM CD) 120 MG 24 hr capsule. I advised pt this was ordered through the PA when she was in the hospital and that it was not prescribed to pt by any of our physicians. Pt wants to know if she was supposed to continue on this medication or what. I didn't know what to tell her I did advise her to call her family Dr. Since she has been seeing him longer and he knows her conditions. Pt states she would feel better if NP/LL would call her or her nurse concerning this matter. Thanks

## 2014-04-18 MED ORDER — BISOPROLOL-HYDROCHLOROTHIAZIDE 10-6.25 MG PO TABS: 1.0000 | ORAL_TABLET | Freq: Every day | ORAL | Status: DC

## 2014-04-18 MED ORDER — TRAMADOL HCL 50 MG PO TABS: 50.0000 mg | ORAL_TABLET | Freq: Three times a day (TID) | ORAL | Status: DC | PRN

## 2014-04-18 NOTE — Telephone Encounter (Signed)
Spoke to pt told her need to continue Ziac, Rx for Ziac sent to pharmacy and Rx for Tramadol called into pharmacy. Pt verbalized understanding.

## 2014-04-18 NOTE — Telephone Encounter (Signed)
I called and no answer.  Pt needs to see her pcp Dr.  Eleonore ChiquitoPeter Kwiatkowski for potassium and cardiazem, lipitor refills.  Xarelto is what we would refill.

## 2014-04-19 NOTE — Telephone Encounter (Signed)
I spoke to [name].  She voiced her frustration over the lack of coordination of care between when she was in the hospital and since being seen in our office earlier this week for stroke.  She was started on several new medications in the hospital that she did not know why she was on or if she was to continue them.  Unfortunately, if she had made 2 week follow up with her PCP as advised, most of these questions would have been answered.    She was found to have atrial fibrillation in the hospital after her stroke, which was the likely cause of her stroke. She was not started on anticoagulation in the hospital, only aspirin but was started on Cardizem for rate control. I had advised her on starting Xarelto at our office visit this week for atrial fibrillation, since she did not want to be on Coumadin and have to go to the physician's office regularly to have levels monitored.  Anticoagulation is the recommended treatment for PAF to reduce the risk of stroke.  [facility], they informed her that a prior authorization needed to be approved.  This upset her.  She was also upset at the cost.  I had instructed her to stay on her aspirin until switching to Xarelto, but she does not recall being told this.  I reiterated this today.  She stated she would continue to take a 325 mg aspirin daily until/if she switched to another blood thinner.  I requested referral to Dr. Jacinto HalimGanji for evaluation and management of her PAF with anticoagulation and Cardizem.  His office kindly scheduled her for Friday June 19.  She is not happy about having to go to yet another doctor and says she feels like all of these doctors see her as a "cash cow."  She reminded me of her age and that she is on a fixed income.  She states that she cannot afford Xarelto and she won't take Coumadin.  I requested she discuss this with Dr. Jacinto HalimGanji tomorrow.  I apologized for not being clearer with her at our visit this week and  tried to reassure her that we were trying to make the best evidence-based recommendations for her care.

## 2014-04-19 NOTE — Telephone Encounter (Signed)
I called pt and spoke to her about her medications. She has not taken anything for anticoagulation for the last 2 days since she stated no one told her that PA would need to be approved so she stopped taking aspirin.   I told her she needed to be on something, aspirin, until PA approved for xarelto.  She has appt with Dr. Jacinto HalimGanji , cardiology for paroxysmal afib tomorrow.   He will be able to advise on her other meds.  She wanted all the doctors to communicate together about her and her plan/meds, etc.   Heide GuileLynn Lam, NP is able to speak to her this am.  Call forwarded to her.

## 2014-05-13 ENCOUNTER — Telehealth: Payer: Self-pay

## 2014-05-13 NOTE — Telephone Encounter (Signed)
[employer] sent us a letter saying they have approved our request for coverage on Xarelto effective until 05/06/2015 Ref # [id number]

## 2014-05-24 ENCOUNTER — Ambulatory Visit: Payer: Self-pay | Admitting: Nurse Practitioner

## 2014-05-24 ENCOUNTER — Encounter: Payer: Self-pay | Admitting: Internal Medicine

## 2014-05-24 ENCOUNTER — Ambulatory Visit: Payer: Medicare Other | Admitting: Internal Medicine

## 2014-05-24 DIAGNOSIS — Z0289 Encounter for other administrative examinations: Secondary | ICD-10-CM

## 2014-05-24 NOTE — Progress Notes (Unsigned)
   Subjective:    Patient ID: Tammy Barron, female    DOB: 04-27-1941, 73 y.o.   MRN: [id number]  HPI    Review of Systems     Objective:   Physical Exam        Assessment & Plan:

## 2014-06-07 NOTE — Telephone Encounter (Signed)
Noted  

## 2014-08-10 ENCOUNTER — Other Ambulatory Visit: Payer: Self-pay | Admitting: Internal Medicine

## 2014-08-19 ENCOUNTER — Other Ambulatory Visit: Payer: Self-pay | Admitting: Internal Medicine

## 2014-08-22 ENCOUNTER — Ambulatory Visit: Payer: Medicare Other | Admitting: Internal Medicine

## 2014-08-23 ENCOUNTER — Ambulatory Visit (INDEPENDENT_AMBULATORY_CARE_PROVIDER_SITE_OTHER): Payer: Medicare Other | Admitting: Internal Medicine

## 2014-08-23 ENCOUNTER — Encounter: Payer: Self-pay | Admitting: Internal Medicine

## 2014-08-23 VITALS — BP 120/80 | HR 71 | Temp 97.9°F | Resp 20 | Ht 64.0 in | Wt 184.0 lb

## 2014-08-23 DIAGNOSIS — E785 Hyperlipidemia, unspecified: Secondary | ICD-10-CM

## 2014-08-23 DIAGNOSIS — I480 Paroxysmal atrial fibrillation: Secondary | ICD-10-CM

## 2014-08-23 DIAGNOSIS — I10 Essential (primary) hypertension: Secondary | ICD-10-CM

## 2014-08-23 DIAGNOSIS — I639 Cerebral infarction, unspecified: Secondary | ICD-10-CM

## 2014-08-23 DIAGNOSIS — Z23 Encounter for immunization: Secondary | ICD-10-CM

## 2014-08-23 DIAGNOSIS — I69993 Ataxia following unspecified cerebrovascular disease: Secondary | ICD-10-CM

## 2014-08-23 DIAGNOSIS — R270 Ataxia, unspecified: Secondary | ICD-10-CM

## 2014-08-23 NOTE — Progress Notes (Signed)
Subjective:    Patient ID: Tammy Barron, female    DOB: 12-Feb-1941, 73 y.o.   MRN: [id number]  HPI  73 year old patient who is seen for followup.  She was hospitalized 6 months ago for a right PICA cerebellar stroke.  She has done quite well and has been followed by neurology and cardiology.  She has a history of paroxysmal atrial fibrillation and is on chronic anticoagulation.  She has done quite well.  She continues to improve.  She still walks with a cane, but only has very mild left-sided dyspraxia. On complaint today is right fifth toe pain following trauma.  This occurred about 2 weeks ago and was associated with some ecchymoses.  There has been no toe deformity Medical regimen reviewed.  This included statin therapy  Past Medical History  Diagnosis Date  . Anxiety   . Depression   . Hypertension   . Hyperlipidemia   . Hx of abnormal Pap smear     History   Social History  . Marital Status: Widowed    Spouse Name: N/A    Number of Children: N/A  . Years of Education: N/A   Occupational History  . Not on file.   Social History Main Topics  . Smoking status: Current Some Day Smoker -- 1.00 packs/day    Types: Cigarettes  . Smokeless tobacco: Never Used  . Alcohol Use: No  . Drug Use: Not on file  . Sexual Activity: Not on file   Other Topics Concern  . Not on file   Social History Narrative  . No narrative on file    Past Surgical History  Procedure Laterality Date  . Abdominal hysterectomy  1982    Family History  Problem Relation Age of Onset  . Heart disease Mother   . Heart attack Father     Allergies  Allergen Reactions  . Latex Rash    Current Outpatient Prescriptions on File Prior to Visit  Medication Sig Dispense Refill  . atorvastatin (LIPITOR) 10 MG tablet Take 1 tablet (10 mg total) by mouth daily at 6 PM.  30 tablet  5  . bisoprolol-hydrochlorothiazide (ZIAC) 10-6.25 MG per tablet TAKE 1 TABLET BY MOUTH DAILY.  90 tablet  0  .  clorazepate (TRANXENE) 3.75 MG tablet TAKE 1 TABLET BY MOUTH TWICE A DAY AS NEEDED  60 tablet  2  . Multiple Vitamin (MULTIVITAMIN) tablet Take 1 tablet by mouth daily.      . polyethylene glycol (MIRALAX / GLYCOLAX) packet Take 17 g by mouth daily. For constipation  14 each  0  . rivaroxaban (XARELTO) 10 MG TABS tablet Take 1 tablet (10 mg total) by mouth daily.  30 tablet  5  . diltiazem (CARDIZEM CD) 120 MG 24 hr capsule Take 1 capsule (120 mg total) by mouth daily.  30 capsule  1  . potassium chloride (K-DUR,KLOR-CON) 10 MEQ tablet Take 1 tablet (10 mEq total) by mouth 2 (two) times daily.  60 tablet  1  . traMADol (ULTRAM) 50 MG tablet Take 1 tablet (50 mg total) by mouth every 8 (eight) hours as needed.  90 tablet  1   No current facility-administered medications on file prior to visit.    BP 120/80  Pulse 71  Temp(Src) 97.9 F (36.6 C) (Oral)  Resp 20  Ht 5\' 4"  (1.626 m)  Wt 184 lb (83.462 kg)  BMI 31.57 kg/m2  SpO2 97%     Review of Systems  Constitutional: Negative.  HENT: Negative for congestion, dental problem, hearing loss, rhinorrhea, sinus pressure, sore throat and tinnitus.   Eyes: Negative for pain, discharge and visual disturbance.  Respiratory: Negative for cough and shortness of breath.   Cardiovascular: Negative for chest pain, palpitations and leg swelling.  Gastrointestinal: Negative for nausea, vomiting, abdominal pain, diarrhea, constipation, blood in stool and abdominal distention.  Genitourinary: Negative for dysuria, urgency, frequency, hematuria, flank pain, vaginal bleeding, vaginal discharge, difficulty urinating, vaginal pain and pelvic pain.  Musculoskeletal: Positive for gait problem. Negative for arthralgias and joint swelling.       Right lateral foot pain  Skin: Negative for rash.  Neurological: Negative for dizziness, syncope, speech difficulty, weakness, numbness and headaches.  Hematological: Negative for adenopathy.    Psychiatric/Behavioral: Negative for behavioral problems, dysphoric mood and agitation. The patient is not nervous/anxious.        Objective:   Physical Exam  Constitutional: She is oriented to person, place, and time. She appears well-developed and well-nourished.  HENT:  Head: Normocephalic.  Right Ear: External ear normal.  Left Ear: External ear normal.  Mouth/Throat: Oropharynx is clear and moist.  Eyes: Conjunctivae and EOM are normal. Pupils are equal, round, and reactive to light.  Neck: Normal range of motion. Neck supple. No thyromegaly present.  Cardiovascular: Normal rate, regular rhythm, normal heart sounds and intact distal pulses.   Pulmonary/Chest: Effort normal and breath sounds normal.  Abdominal: Soft. Bowel sounds are normal. She exhibits no mass. There is no tenderness.  Musculoskeletal: Normal range of motion.  Lymphadenopathy:    She has no cervical adenopathy.  Neurological: She is alert and oriented to person, place, and time. Coordination abnormal.  Mildly impaired left sided finger to nose testing  Skin: Skin is warm and dry. No rash noted.  Psychiatric: She has a normal mood and affect. Her behavior is normal.          Assessment & Plan:   Status post cerebellar stroke Hypertension stable Paroxysmal atrial fibrillation.  Continue chronic anticoagulation  Followup cardiology and neurology CPX 6 months

## 2014-08-23 NOTE — Progress Notes (Signed)
Pre visit review using our clinic review tool, if applicable. No additional management support is needed unless otherwise documented below in the visit note. 

## 2014-08-23 NOTE — Patient Instructions (Signed)
Limit your sodium (Salt) intake    It is important that you exercise regularly, at least 20 minutes 3 to 4 times per week.  If you develop chest pain or shortness of breath seek  medical attention.  Please check your blood pressure on a regular basis.  If it is consistently greater than 150/90, please make an office appointment.  Return in 6 months for follow-up   

## 2014-08-24 ENCOUNTER — Telehealth: Payer: Self-pay | Admitting: Internal Medicine

## 2014-08-24 NOTE — Telephone Encounter (Signed)
emmi mailed  °

## 2014-08-31 ENCOUNTER — Telehealth: Payer: Self-pay | Admitting: Internal Medicine

## 2014-08-31 NOTE — Telephone Encounter (Signed)
Pt said on her summary it states that she take  rivaroxaban (XARELTO) 10 MG TABS tablet. She said she is currently taking PRADAXA. She would like XARELTO removed and PRADAXA added.

## 2014-09-03 ENCOUNTER — Encounter: Payer: Self-pay | Admitting: Internal Medicine

## 2014-09-05 NOTE — Telephone Encounter (Signed)
Left message on voicemail to call office.  

## 2014-09-05 NOTE — Telephone Encounter (Signed)
Pt called back, asked her who put her on Pradaxa ? Pt stated her Cardiologist Dr. Jacinto HalimGanji. Told her okay will take Xarelto off med list and add Pradaxa 150 mg twice a day. Pt verbalized understanding.

## 2014-10-17 ENCOUNTER — Other Ambulatory Visit: Payer: Self-pay | Admitting: Nurse Practitioner

## 2014-10-17 NOTE — Telephone Encounter (Signed)
Previously prescribed by Lynn 

## 2014-11-09 ENCOUNTER — Other Ambulatory Visit: Payer: Self-pay | Admitting: Internal Medicine

## 2014-11-09 ENCOUNTER — Telehealth: Payer: Self-pay | Admitting: Internal Medicine

## 2014-11-09 MED ORDER — BISOPROLOL-HYDROCHLOROTHIAZIDE 10-6.25 MG PO TABS: 1.0000 | ORAL_TABLET | Freq: Every day | ORAL | Status: DC

## 2014-11-09 NOTE — Telephone Encounter (Signed)
Pt needs refill on generic  ziac #90 call [facility] [address]. Pt has only 2 pills left.

## 2014-11-09 NOTE — Telephone Encounter (Signed)
Left detailed message Rx sent to pharmacy as requested. 

## 2015-01-14 ENCOUNTER — Other Ambulatory Visit: Payer: Self-pay | Admitting: Neurology

## 2015-01-14 NOTE — Telephone Encounter (Signed)
Last OV was cancelled with reason patient has moved.

## 2015-01-21 ENCOUNTER — Other Ambulatory Visit: Payer: Self-pay | Admitting: Internal Medicine

## 2015-01-24 ENCOUNTER — Ambulatory Visit: Payer: Self-pay | Admitting: Internal Medicine

## 2015-02-11 ENCOUNTER — Other Ambulatory Visit: Payer: Self-pay | Admitting: Internal Medicine

## 2015-05-09 ENCOUNTER — Other Ambulatory Visit: Payer: Self-pay | Admitting: Internal Medicine

## 2015-07-01 ENCOUNTER — Other Ambulatory Visit: Payer: Self-pay | Admitting: Internal Medicine

## 2015-08-07 ENCOUNTER — Other Ambulatory Visit: Payer: Self-pay | Admitting: Internal Medicine

## 2015-08-09 ENCOUNTER — Other Ambulatory Visit: Payer: Self-pay

## 2015-08-09 DIAGNOSIS — I6521 Occlusion and stenosis of right carotid artery: Secondary | ICD-10-CM

## 2015-10-01 ENCOUNTER — Encounter: Payer: Self-pay | Admitting: Vascular Surgery

## 2015-10-02 ENCOUNTER — Other Ambulatory Visit: Payer: Self-pay | Admitting: *Deleted

## 2015-10-02 DIAGNOSIS — I6521 Occlusion and stenosis of right carotid artery: Secondary | ICD-10-CM

## 2015-10-03 ENCOUNTER — Ambulatory Visit (INDEPENDENT_AMBULATORY_CARE_PROVIDER_SITE_OTHER): Payer: Medicare Other | Admitting: Vascular Surgery

## 2015-10-03 ENCOUNTER — Ambulatory Visit (HOSPITAL_COMMUNITY)
Admission: RE | Admit: 2015-10-03 | Discharge: 2015-10-03 | Disposition: A | Discharge status: Home / Self Care | Payer: Medicare Other | Source: Ambulatory Visit | Attending: Vascular Surgery | Admitting: Vascular Surgery

## 2015-10-03 ENCOUNTER — Encounter: Payer: Self-pay | Admitting: Vascular Surgery

## 2015-10-03 VITALS — BP 139/67 | HR 63 | Ht 64.0 in | Wt 185.0 lb

## 2015-10-03 DIAGNOSIS — I6521 Occlusion and stenosis of right carotid artery: Secondary | ICD-10-CM

## 2015-10-03 DIAGNOSIS — E785 Hyperlipidemia, unspecified: Secondary | ICD-10-CM | POA: Diagnosis not present

## 2015-10-03 DIAGNOSIS — I6523 Occlusion and stenosis of bilateral carotid arteries: Secondary | ICD-10-CM | POA: Diagnosis not present

## 2015-10-03 DIAGNOSIS — I10 Essential (primary) hypertension: Secondary | ICD-10-CM | POA: Insufficient documentation

## 2015-10-03 NOTE — Addendum Note (Signed)
Addended by: Adria DillELDRIDGE-LEWIS, Clora Ohmer L on: 10/03/2015 04:56 PM   Modules accepted: Orders

## 2015-10-03 NOTE — Progress Notes (Signed)
[facility] [place] HISTORY AND PHYSICAL   History of Present Illness:  Patient is a 74 y.o. female who presents for evaluation of asymptomatic right internal carotid artery stenosis. The patient did have a posterior distribution stroke in April 2015. She had a carotid duplex scan at that time which showed 60-80% right internal carotid artery stenosis. She was also and paroxysmal atrial fibrillation. She was placed on Pradaxa at that time. She has not had any further events. However she does have residual dizziness which she states is either orthostatic or related to her prior stroke. She currently smokes half a pack of cigarettes per week. She is not interested in quitting.  Other medical problems include hypertension, hyperlipidemia which are both stable.   Past Medical History  Diagnosis Date  . Anxiety   . Depression   . Hypertension   . Hyperlipidemia   . Hx of abnormal Pap smear   . Anemia   . Atrial fibrillation [facility])     Past Surgical History  Procedure Laterality Date  . Abdominal hysterectomy  1982    Social History Social History  Substance Use Topics  . Smoking status: Current Some Day Smoker -- 1.00 packs/day for 40 years    Types: Cigarettes  . Smokeless tobacco: Never Used  . Alcohol Use: No    Family History Family History  Problem Relation Age of Onset  . Heart disease Mother   . Heart attack Father     Allergies  Allergies  Allergen Reactions  . Latex Rash     Current Outpatient Prescriptions  Medication Sig Dispense Refill  . acetaminophen (TYLENOL) 500 MG tablet Take 500 mg by mouth every 6 (six) hours as needed.    Marland Kitchen atorvastatin (LIPITOR) 10 MG tablet TAKE 1 TABLET BY MOUTH EVERY DAY AT 6 PM 90 tablet 0  . bisoprolol-hydrochlorothiazide (ZIAC) 10-6.25 MG per tablet Take 1 tablet by mouth daily. 90 tablet 1  . clorazepate (TRANXENE) 3.75 MG tablet TAKE 1 TABLET TWICE A DAY AS NEEDED 60 tablet 2  . dabigatran (PRADAXA) 150  MG CAPS capsule Take 150 mg by mouth 2 (two) times daily.    . Multiple Vitamin (MULTIVITAMIN) tablet Take 1 tablet by mouth daily.    . polyethylene glycol (MIRALAX / GLYCOLAX) packet Take 17 g by mouth daily. For constipation (Patient not taking: Reported on 10/03/2015) 14 each 0  . traMADol (ULTRAM) 50 MG tablet Take 1 tablet (50 mg total) by mouth every 8 (eight) hours as needed. (Patient not taking: Reported on 10/03/2015) 90 tablet 1   No current facility-administered medications for this visit.    ROS:   General:  No weight loss, Fever, chills  HEENT: No recent headaches, no nasal bleeding, no visual changes, no sore throat  Neurologic: + dizziness, no blackouts, no seizures. No recent symptoms of stroke or mini- stroke. No recent episodes of slurred speech, or temporary blindness.  Cardiac: No recent episodes of chest pain/pressure, no shortness of breath at rest.  No shortness of breath with exertion.  Denies history of atrial fibrillation or irregular heartbeat  Vascular: No history of rest pain in feet.  No history of claudication.  No history of non-healing ulcer, No history of DVT   Pulmonary: No home oxygen, no productive cough, no hemoptysis,  No asthma or wheezing  Musculoskeletal:   Arthritis,  Low back pain,   Joint pain  Hematologic:No history of hypercoagulable state.  No history of easy bleeding.  No history of anemia  Gastrointestinal: No hematochezia or melena,  No gastroesophageal reflux, no trouble swallowing  Urinary: [ ]  chronic Kidney disease, [ ]  on HD - [ ]  MWF or [ ]  TTHS, [ ]  Burning with urination, [ ]  Frequent urination, [ ]  Difficulty urinating;   Skin: No rashes  Psychological: No history of anxiety,  No history of depression   Physical Examination  Filed Vitals:   10/03/15 1314 10/03/15 1317  BP: 141/61 139/67  Pulse: 63   Height: 5\' 4"  (1.626 m)   Weight: 185 lb (83.915 kg)   SpO2: 97%     Body mass index is 31.74  kg/(m^2).  General:  Alert and oriented, no acute distress HEENT: Normal Neck: No bruit or JVD Pulmonary: Clear to auscultation bilaterally Cardiac: Regular Rate and Rhythm without murmur Abdomen: Soft, non-tender, non-distended, no mass Skin: No rash Extremity Pulses:  2+ radial, brachial, femoral pulses bilaterally Musculoskeletal: No deformity or edema  Neurologic: Upper and lower extremity motor 5/5 and symmetric  DATA:  Patient had a repeat carotid duplex scan in our office today. This showed again 60-80% right internal carotid artery stenosis velocities 375/99 antegrade left retrograde right vertebral flow less than 40% left internal carotid artery stenosis   ASSESSMENT:  Patient currently asymptomatic with 60-80% right internal carotid artery stenosis. Velocities are not significantly different from April 2015. She remains asymptomatic.   PLAN:  Continue carotid surveillance every 6 months. If she progresses to greater than 80% remains asymptomatic we will consider carotid endarterectomy at that point. We would also consider carotid endarterectomy at any time she develops any symptoms. She will continue her Pradaxa. She will follow-up with us in 6 months.  Fabienne Brunsharles Fields, MD [facility] [place] Office: [phone] Pager: [phone]

## 2015-10-18 ENCOUNTER — Encounter: Payer: Self-pay | Admitting: Internal Medicine

## 2015-10-18 ENCOUNTER — Ambulatory Visit (INDEPENDENT_AMBULATORY_CARE_PROVIDER_SITE_OTHER): Payer: Medicare Other | Admitting: Internal Medicine

## 2015-10-18 VITALS — BP 128/90 | HR 65 | Temp 97.4°F | Ht 64.0 in | Wt 184.0 lb

## 2015-10-18 DIAGNOSIS — I10 Essential (primary) hypertension: Secondary | ICD-10-CM

## 2015-10-18 DIAGNOSIS — B351 Tinea unguium: Secondary | ICD-10-CM | POA: Diagnosis not present

## 2015-10-18 DIAGNOSIS — Z23 Encounter for immunization: Secondary | ICD-10-CM | POA: Diagnosis not present

## 2015-10-18 NOTE — Patient Instructions (Signed)
Limit your sodium (Salt) intake  Please check your blood pressure on a regular basis.  If it is consistently greater than 150/90, please make an office appointment.  Return in 6 months for follow-up   

## 2015-10-18 NOTE — Progress Notes (Signed)
Pre visit review using our clinic review tool, if applicable. No additional management support is needed unless otherwise documented below in the visit note. 

## 2015-10-18 NOTE — Progress Notes (Signed)
Subjective:    Patient ID: Tammy Barron, female    DOB: June 30, 1941, 74 y.o.   MRN: [id number]  HPI  74 year old patient who has a history of atrial fibrillation, prior CVA and remains on chronic anticoagulation. She was seen recently by her pedicurist who is concerned about her toenail changes.  Patient has no pain but long history of thickened nails  Past Medical History  Diagnosis Date  . Anxiety   . Depression   . Hypertension   . Hyperlipidemia   . Hx of abnormal Pap smear   . Anemia   . Atrial fibrillation [facility])     Social History   Social History  . Marital Status: Widowed    Spouse Name: N/A  . Number of Children: N/A  . Years of Education: N/A   Occupational History  . Not on file.   Social History Main Topics  . Smoking status: Current Some Day Smoker -- 1.00 packs/day for 40 years    Types: Cigarettes  . Smokeless tobacco: Never Used  . Alcohol Use: No  . Drug Use: Not on file  . Sexual Activity: Not on file   Other Topics Concern  . Not on file   Social History Narrative    Past Surgical History  Procedure Laterality Date  . Abdominal hysterectomy  1982    Family History  Problem Relation Age of Onset  . Heart disease Mother   . Heart attack Father     Allergies  Allergen Reactions  . Latex Rash    Current Outpatient Prescriptions on File Prior to Visit  Medication Sig Dispense Refill  . acetaminophen (TYLENOL) 500 MG tablet Take 500 mg by mouth every 6 (six) hours as needed.    Marland Kitchen atorvastatin (LIPITOR) 10 MG tablet TAKE 1 TABLET BY MOUTH EVERY DAY AT 6 PM 90 tablet 0  . bisoprolol-hydrochlorothiazide (ZIAC) 10-6.25 MG per tablet Take 1 tablet by mouth daily. 90 tablet 1  . clorazepate (TRANXENE) 3.75 MG tablet TAKE 1 TABLET TWICE A DAY AS NEEDED 60 tablet 2  . dabigatran (PRADAXA) 150 MG CAPS capsule Take 150 mg by mouth 2 (two) times daily.    . Multiple Vitamin (MULTIVITAMIN) tablet Take 1 tablet by mouth daily.    .  polyethylene glycol (MIRALAX / GLYCOLAX) packet Take 17 g by mouth daily. For constipation 14 each 0  . traMADol (ULTRAM) 50 MG tablet Take 1 tablet (50 mg total) by mouth every 8 (eight) hours as needed. 90 tablet 1   No current facility-administered medications on file prior to visit.    BP 128/90 mmHg  Pulse 65  Temp(Src) 97.4 F (36.3 C) (Oral)  Ht  (1.626 m)  Wt 184 lb (83.462 kg)  BMI 31.57 kg/m2  SpO2 97%     Review of Systems  Constitutional: Negative.   HENT: Negative for congestion, dental problem, hearing loss, rhinorrhea, sinus pressure, sore throat and tinnitus.   Eyes: Negative for pain, discharge and visual disturbance.  Respiratory: Negative for cough and shortness of breath.   Cardiovascular: Negative for chest pain, palpitations and leg swelling.  Gastrointestinal: Negative for nausea, vomiting, abdominal pain, diarrhea, constipation, blood in stool and abdominal distention.  Genitourinary: Negative for dysuria, urgency, frequency, hematuria, flank pain, vaginal bleeding, vaginal discharge, difficulty urinating, vaginal pain and pelvic pain.  Musculoskeletal: Negative for joint swelling, arthralgias and gait problem.  Skin: Negative for rash.       Toenail thickening  Neurological: Negative for dizziness, syncope,  speech difficulty, weakness, numbness and headaches.  Hematological: Negative for adenopathy.  Psychiatric/Behavioral: Negative for behavioral problems, dysphoric mood and agitation. The patient is not nervous/anxious.        Objective:   Physical Exam  Constitutional: She appears well-developed and well-nourished. No distress.  Skin:  Onychomycotic toenail changes, most prominent involving the left great toenail          Assessment & Plan:   Toenail onychomycosis.  Options discussed.  Patient will simply observe and keep her nails trimmed.  At this time Chronic anticoagulation History of right ICA stenosis 60-79%.  Follow-up carotid  artery Doppler one year Hypertension, well-controlled

## 2015-10-24 ENCOUNTER — Other Ambulatory Visit: Payer: Self-pay | Admitting: Internal Medicine

## 2015-12-22 ENCOUNTER — Other Ambulatory Visit: Payer: Self-pay | Admitting: Internal Medicine

## 2016-01-23 ENCOUNTER — Other Ambulatory Visit: Payer: Self-pay | Admitting: Internal Medicine

## 2016-03-13 ENCOUNTER — Telehealth: Payer: Self-pay

## 2016-03-13 NOTE — Telephone Encounter (Signed)
Patient is on my [employer] list for 2017. May be a good canidate for you to call.

## 2016-04-03 ENCOUNTER — Encounter: Payer: Self-pay | Admitting: Family

## 2016-04-09 ENCOUNTER — Ambulatory Visit (HOSPITAL_COMMUNITY)
Admission: RE | Admit: 2016-04-09 | Discharge: 2016-04-09 | Disposition: A | Discharge status: Home / Self Care | Payer: Medicare Other | Source: Ambulatory Visit | Attending: Family | Admitting: Family

## 2016-04-09 ENCOUNTER — Ambulatory Visit (INDEPENDENT_AMBULATORY_CARE_PROVIDER_SITE_OTHER): Payer: Medicare Other | Admitting: Family

## 2016-04-09 ENCOUNTER — Encounter: Payer: Self-pay | Admitting: Family

## 2016-04-09 VITALS — BP 138/72 | HR 59 | Ht 64.0 in | Wt 186.3 lb

## 2016-04-09 DIAGNOSIS — F329 Major depressive disorder, single episode, unspecified: Secondary | ICD-10-CM | POA: Insufficient documentation

## 2016-04-09 DIAGNOSIS — F419 Anxiety disorder, unspecified: Secondary | ICD-10-CM | POA: Diagnosis not present

## 2016-04-09 DIAGNOSIS — I6521 Occlusion and stenosis of right carotid artery: Secondary | ICD-10-CM | POA: Diagnosis present

## 2016-04-09 DIAGNOSIS — E785 Hyperlipidemia, unspecified: Secondary | ICD-10-CM | POA: Insufficient documentation

## 2016-04-09 DIAGNOSIS — Z87891 Personal history of nicotine dependence: Secondary | ICD-10-CM

## 2016-04-09 DIAGNOSIS — I6523 Occlusion and stenosis of bilateral carotid arteries: Secondary | ICD-10-CM | POA: Diagnosis not present

## 2016-04-09 DIAGNOSIS — Z8673 Personal history of transient ischemic attack (TIA), and cerebral infarction without residual deficits: Secondary | ICD-10-CM | POA: Diagnosis not present

## 2016-04-09 DIAGNOSIS — I10 Essential (primary) hypertension: Secondary | ICD-10-CM | POA: Insufficient documentation

## 2016-04-09 LAB — VAS US CAROTID
LCCADSYS: -80 cm/s
LCCAPDIAS: 11 cm/s
LEFT VERTEBRAL DIAS: -22 cm/s
LICAPDIAS: 26 cm/s
Left CCA dist dias: -16 cm/s
Left CCA prox sys: 106 cm/s
Left ICA dist dias: -23 cm/s
Left ICA dist sys: -146 cm/s
Left ICA prox sys: 136 cm/s
RCCADSYS: -107 cm/s
RCCAPSYS: 105 cm/s
RIGHT CCA MID DIAS: 12 cm/s
RIGHT ECA DIAS: -11 cm/s
Right CCA prox dias: 12 cm/s

## 2016-04-09 NOTE — Patient Instructions (Addendum)
Stroke Prevention Some medical conditions and behaviors are associated with an increased chance of having a stroke. You may prevent a stroke by making healthy choices and managing medical conditions. HOW CAN I REDUCE MY RISK OF HAVING A STROKE?   Stay physically active. Get at least 30 minutes of activity on most or all days.  Do not smoke. It may also be helpful to avoid exposure to secondhand smoke.  Limit alcohol use. Moderate alcohol use is considered to be:  No more than 2 drinks per day for men.  No more than 1 drink per day for nonpregnant women.  Eat healthy foods. This involves:  Eating 5 or more servings of fruits and vegetables a day.  Making dietary changes that address high blood pressure (hypertension), high cholesterol, diabetes, or obesity.  Manage your cholesterol levels.  Making food choices that are high in fiber and low in saturated fat, trans fat, and cholesterol may control cholesterol levels.  Take any prescribed medicines to control cholesterol as directed by your health care provider.  Manage your diabetes.  Controlling your carbohydrate and sugar intake is recommended to manage diabetes.  Take any prescribed medicines to control diabetes as directed by your health care provider.  Control your hypertension.  Making food choices that are low in salt (sodium), saturated fat, trans fat, and cholesterol is recommended to manage hypertension.  Ask your health care provider if you need treatment to lower your blood pressure. Take any prescribed medicines to control hypertension as directed by your health care provider.  If you are 18-39 years of age, have your blood pressure checked every 3-5 years. If you are 40 years of age or older, have your blood pressure checked every year.  Maintain a healthy weight.  Reducing calorie intake and making food choices that are low in sodium, saturated fat, trans fat, and cholesterol are recommended to manage  weight.  Stop drug abuse.  Avoid taking birth control pills.  Talk to your health care provider about the risks of taking birth control pills if you are over 35 years old, smoke, get migraines, or have ever had a blood clot.  Get evaluated for sleep disorders (sleep apnea).  Talk to your health care provider about getting a sleep evaluation if you snore a lot or have excessive sleepiness.  Take medicines only as directed by your health care provider.  For some people, aspirin or blood thinners (anticoagulants) are helpful in reducing the risk of forming abnormal blood clots that can lead to stroke. If you have the irregular heart rhythm of atrial fibrillation, you should be on a blood thinner unless there is a good reason you cannot take them.  Understand all your medicine instructions.  Make sure that other conditions (such as anemia or atherosclerosis) are addressed. SEEK IMMEDIATE MEDICAL CARE IF:   You have sudden weakness or numbness of the face, arm, or leg, especially on one side of the body.  Your face or eyelid droops to one side.  You have sudden confusion.  You have trouble speaking (aphasia) or understanding.  You have sudden trouble seeing in one or both eyes.  You have sudden trouble walking.  You have dizziness.  You have a loss of balance or coordination.  You have a sudden, severe headache with no known cause.  You have new chest pain or an irregular heartbeat. Any of these symptoms may represent a serious problem that is an emergency. Do not wait to see if the symptoms will   go away. Get medical help at once. Call your local emergency services (911 in [place] Do not drive yourself to the hospital.   This information is not intended to replace advice given to you by your health care provider. Make sure you discuss any questions you have with your health care provider.   Document Released: 11/26/2004 Document Revised: 11/09/2014 Document Reviewed:  04/21/2013 Elsevier Interactive Patient Education 2016 Elsevier Inc.  

## 2016-04-09 NOTE — Progress Notes (Signed)
Chief Complaint: Follow up Extracranial Carotid Artery Stenosis   History of Present Illness  Tammy Barron is a 75 y.o. female patient of Dr. Darrick Penna who presents for evaluation of asymptomatic right internal carotid artery stenosis. The patient did have a posterior distribution stroke in April 2015. She had a carotid duplex scan at that time which showed 60-80% right internal carotid artery stenosis. She was also and paroxysmal atrial fibrillation. She was placed on Pradaxa at that time. She has not had any further events. However she does have residual dizziness which she states is either orthostatic or related to her prior stroke.  Other medical problems include hypertension, hyperlipidemia which are both stable.  Pt states she has had no stroke or TIA symptoms since April 2015.  Patient has not had previous carotid artery intervention.  The patient reports New Medical or Surgical History: states she has much dental work ongoing.   Pt Diabetic: no Pt smoker: smoker  (2-3 cigarettes/day, started at age 72 yrs)  Pt meds include: Statin : yes ASA: no Other anticoagulants/antiplatelets: Pradaxa for atrial fib, her cardiologist is Dr. Jacinto Halim   Past Medical History  Diagnosis Date  . Anxiety   . Depression   . Hypertension   . Hyperlipidemia   . Hx of abnormal Pap smear   . Anemia   . Atrial fibrillation [facility])     Social History Social History  Substance Use Topics  . Smoking status: Current Some Day Smoker -- 1.00 packs/day for 40 years    Types: Cigarettes  . Smokeless tobacco: Never Used  . Alcohol Use: No    Family History Family History  Problem Relation Age of Onset  . Heart disease Mother   . Heart attack Father     Surgical History Past Surgical History  Procedure Laterality Date  . Abdominal hysterectomy  1982    Allergies  Allergen Reactions  . Latex Rash    Current Outpatient Prescriptions  Medication Sig Dispense Refill  . acetaminophen  (TYLENOL) 500 MG tablet Take 500 mg by mouth every 6 (six) hours as needed.    Marland Kitchen atorvastatin (LIPITOR) 10 MG tablet TAKE 1 TABLET BY MOUTH EVERY DAY AT 6 PM 90 tablet 0  . bisoprolol-hydrochlorothiazide (ZIAC) 10-6.25 MG per tablet Take 1 tablet by mouth daily. 90 tablet 1  . clorazepate (TRANXENE) 3.75 MG tablet TAKE 1 TABLET TWICE A DAY AS NEEDED 60 tablet 2  . dabigatran (PRADAXA) 150 MG CAPS capsule Take 150 mg by mouth 2 (two) times daily.    . Multiple Vitamin (MULTIVITAMIN) tablet Take 1 tablet by mouth daily.    . polyethylene glycol (MIRALAX / GLYCOLAX) packet Take 17 g by mouth daily. For constipation 14 each 0  . traMADol (ULTRAM) 50 MG tablet Take 1 tablet (50 mg total) by mouth every 8 (eight) hours as needed. 90 tablet 1   No current facility-administered medications for this visit.    Review of Systems : See HPI for pertinent positives and negatives.  Physical Examination  Filed Vitals:   04/09/16 1346 04/09/16 1347  BP: 138/77 138/72  Pulse: 59   Height: 5\' 4"  (1.626 m)   Weight: 186 lb 4.8 oz (84.505 kg)   SpO2: 96%    Body mass index is 31.96 kg/(m^2).  General: WDWN obese female in NAD GAIT: normal Eyes: PERRLA Pulmonary:  Respirations are non-labored, CTAB, no rales or rhonchi, + few faint wheezes  Cardiac: regular rhythm, no detected murmur.  VASCULAR EXAM Carotid  Bruits Right Left   Negative Negative    Aorta is not palpable. Radial pulses are 2+ palpable and equal.                                                                                                                            LE Pulses Right Left       POPLITEAL  not palpable   not palpable       POSTERIOR TIBIAL  not palpable   not palpable        DORSALIS PEDIS      ANTERIOR TIBIAL faintly palpable  faintly palpable     Gastrointestinal: soft, nontender, BS WNL, no r/g,  negative palpable masses.  Musculoskeletal: no muscle atrophy/wasting. M/S 5/5 throughout, extremities  without ischemic changes.  Neurologic: A&O X 3; Appropriate Affect, Speech is normal CN 2-12 intact, pain and light touch intact in extremities, Motor exam as listed above.   Non-Invasive Vascular Imaging CAROTID DUPLEX 04/09/2016   CEREBROVASCULAR DUPLEX EVALUATION    INDICATION: Carotid artery disease    PREVIOUS INTERVENTION(S): None    DUPLEX EXAM: Carotid duplex    RIGHT  LEFT  Peak Systolic Velocities (cm/s) End Diastolic Velocities (cm/s) Plaque LOCATION Peak Systolic Velocities (cm/s) End Diastolic Velocities (cm/s) Plaque  105 12  CCA PROXIMAL 106 11   82 12  CCA MID 83 14   80 18 HT CCA DISTAL 80 16   197 11 HT ECA 125 8   354 96 HT ICA PROXIMAL 136 26 CP  124 19  ICA MID 133 27 CP  107 17  ICA DISTAL 146 23     4.3 ICA / CCA Ratio (PSV) 1.6  Retrograde Vertebral Flow Antegrade  - Brachial Systolic Pressure (mmHg) -  Triphasic Brachial Artery Waveforms Triphasic    Plaque Morphology:  HM = Homogeneous, HT = Heterogeneous, CP = Calcific Plaque, SP = Smooth Plaque, IP = Irregular Plaque     ADDITIONAL FINDINGS: Multiphasic subclavian arteries    IMPRESSION: 1. 60 - 79%  upper end of range right internal carotid artery stenosis. 2. Less than 40% left internal carotid artery stenosis, calcific plaque may obscure higher velocity.    Compared to the previous exam:  No significant change since prior exam       Assessment: Tammy Barron is a 75 y.o. female who had a posterior distribution stroke in April 2015. She had a carotid duplex scan at that time which showed 60-80% right internal carotid artery stenosis. She was also and paroxysmal atrial fibrillation. She was placed on Pradaxa at that time. She has not had any further events. However she does have residual dizziness which she states is either orthostatic or related to her prior stroke.  Pt states she has had no stroke or TIA symptoms since April 2015. Today's carotid duplex suggests 60 - 79%  upper end of  range right internal carotid artery stenosis. Less than 40% left internal carotid  artery stenosis, calcific plaque may obscure higher velocity. No significant change since prior exam.   Plan: Follow-up in 6 months with Carotid Duplex scan.  The patient was counseled re smoking cessation and given several free resources re smoking cessation.    I discussed in depth with the patient the nature of atherosclerosis, and emphasized the importance of maximal medical management including strict control of blood pressure, blood glucose, and lipid levels, obtaining regular exercise, and cessation of smoking.  The patient is aware that without maximal medical management the underlying atherosclerotic disease process will progress, limiting the benefit of any interventions. The patient was given information about stroke prevention and what symptoms should prompt the patient to seek immediate medical care. Thank you for allowing us to participate in this patient's care.  Charisse MarchSuzanne Allannah Kempen, RN, MSN, FNP-C [facility] [place] Office: [phone]  Clinic Physician: Darrick PennaFields  04/09/2016 2:26 PM

## 2016-04-16 ENCOUNTER — Other Ambulatory Visit: Payer: Self-pay | Admitting: Internal Medicine

## 2016-05-20 ENCOUNTER — Telehealth: Payer: Self-pay

## 2016-05-20 NOTE — Telephone Encounter (Signed)
Call to [name] to discuss AWV; States she is "as well as she is going to be" and declined AWV. STates she just does not want to participate at this time.

## 2016-06-02 ENCOUNTER — Other Ambulatory Visit: Payer: Self-pay | Admitting: *Deleted

## 2016-06-02 DIAGNOSIS — I6523 Occlusion and stenosis of bilateral carotid arteries: Secondary | ICD-10-CM

## 2016-06-03 ENCOUNTER — Other Ambulatory Visit: Payer: Self-pay | Admitting: Internal Medicine

## 2016-06-04 NOTE — Telephone Encounter (Signed)
Please refill.

## 2016-06-05 NOTE — Telephone Encounter (Signed)
Okay to refill? Patient last seen 10/2015 for acute visit.

## 2016-06-05 NOTE — Telephone Encounter (Signed)
Ok to rf? 

## 2016-06-05 NOTE — Telephone Encounter (Signed)
Rx called in 

## 2016-08-04 ENCOUNTER — Other Ambulatory Visit: Payer: Self-pay | Admitting: Internal Medicine

## 2016-09-09 ENCOUNTER — Encounter: Payer: Self-pay | Admitting: Internal Medicine

## 2016-10-07 ENCOUNTER — Encounter: Payer: Self-pay | Admitting: Family

## 2016-10-15 ENCOUNTER — Encounter (HOSPITAL_COMMUNITY): Payer: Medicare Other

## 2016-10-15 ENCOUNTER — Ambulatory Visit: Payer: Medicare Other | Admitting: Family

## 2016-12-04 ENCOUNTER — Encounter: Payer: Self-pay | Admitting: Family

## 2016-12-10 ENCOUNTER — Encounter: Payer: Self-pay | Admitting: Family

## 2016-12-10 ENCOUNTER — Ambulatory Visit (INDEPENDENT_AMBULATORY_CARE_PROVIDER_SITE_OTHER): Payer: Medicare Other | Admitting: Family

## 2016-12-10 ENCOUNTER — Ambulatory Visit (HOSPITAL_COMMUNITY)
Admission: RE | Admit: 2016-12-10 | Discharge: 2016-12-10 | Disposition: A | Discharge status: Home / Self Care | Payer: Medicare Other | Source: Ambulatory Visit | Attending: Family | Admitting: Family

## 2016-12-10 VITALS — BP 133/72 | HR 60 | Temp 97.4°F | Resp 20 | Ht 63.0 in | Wt 184.0 lb

## 2016-12-10 DIAGNOSIS — Z8673 Personal history of transient ischemic attack (TIA), and cerebral infarction without residual deficits: Secondary | ICD-10-CM

## 2016-12-10 DIAGNOSIS — I6523 Occlusion and stenosis of bilateral carotid arteries: Secondary | ICD-10-CM | POA: Diagnosis not present

## 2016-12-10 DIAGNOSIS — Z87891 Personal history of nicotine dependence: Secondary | ICD-10-CM | POA: Diagnosis not present

## 2016-12-10 DIAGNOSIS — I6521 Occlusion and stenosis of right carotid artery: Secondary | ICD-10-CM | POA: Diagnosis not present

## 2016-12-10 LAB — VAS US CAROTID
LCCADSYS: -95 cm/s
LEFT ECA DIAS: -11 cm/s
LICADDIAS: 14 cm/s
LICAPDIAS: 26 cm/s
Left CCA dist dias: -18 cm/s
Left CCA prox dias: 20 cm/s
Left CCA prox sys: 110 cm/s
Left ICA dist sys: 62 cm/s
Left ICA prox sys: 155 cm/s
RCCAPDIAS: 16 cm/s
RCCAPSYS: 116 cm/s
RIGHT CCA MID DIAS: 14 cm/s
RIGHT ECA DIAS: -10 cm/s
Right cca dist sys: -101 cm/s

## 2016-12-10 NOTE — Progress Notes (Signed)
Chief Complaint: Follow up Extracranial Carotid Artery Stenosis   History of Present Illness  Tammy Barron is a 76 y.o. female patient of Dr. Darrick Penna who presents for evaluation of right internal carotid artery stenosis. The patient did have a posterior distribution stroke in April 2015. She had a carotid duplex scan at that time which showed 60-80% right internal carotid artery stenosis. She also had paroxysmal atrial fibrillation. She was placed on Pradaxa at that time.   Pt states she has had no stroke or TIA symptoms since April 2015. However she does have residual dizziness which she states is either orthostatic or related to her prior stroke.  Other medical problems include hypertension, hyperlipidemia which are both stable.   Patient has not had previous carotid artery intervention.  Pt is active in [employer] 3-4 days/week, if the weather is nice.   She states she finished her extensive dental work.   Pt Diabetic: no Pt smoker: smoker  (2-3 cigarettes/day, started at age 16 yrs)  Pt meds include: Statin : yes ASA: no Other anticoagulants/antiplatelets: Pradaxa for atrial fib, her cardiologist is Dr. Jacinto Halim   Past Medical History:  Diagnosis Date  . Anemia   . Anxiety   . Atrial fibrillation (HCC)   . Depression   . Hx of abnormal Pap smear   . Hyperlipidemia   . Hypertension     Social History Social History  Substance Use Topics  . Smoking status: Current Some Day Smoker    Packs/day: 1.00    Years: 40.00    Types: Cigarettes  . Smokeless tobacco: Never Used     Comment: Occasionally  . Alcohol use No    Family History Family History  Problem Relation Age of Onset  . Heart disease Mother   . Heart attack Father     Surgical History Past Surgical History:  Procedure Laterality Date  . ABDOMINAL HYSTERECTOMY  1982    Allergies  Allergen Reactions  . Latex Rash    Current Outpatient Prescriptions  Medication Sig Dispense Refill   . acetaminophen (TYLENOL) 500 MG tablet Take 500 mg by mouth every 6 (six) hours as needed.    Marland Kitchen atorvastatin (LIPITOR) 10 MG tablet TAKE 1 TABLET BY MOUTH EVERY DAY AT 6 PM 90 tablet 1  . bisoprolol-hydrochlorothiazide (ZIAC) 10-6.25 MG per tablet Take 1 tablet by mouth daily. 90 tablet 1  . clorazepate (TRANXENE) 3.75 MG tablet TAKE 1 TABLET BY MOUTH TWICE A DAY AS NEEDED 60 tablet 2  . dabigatran (PRADAXA) 150 MG CAPS capsule Take 150 mg by mouth 2 (two) times daily.    . Multiple Vitamin (MULTIVITAMIN) tablet Take 1 tablet by mouth daily.    . polyethylene glycol (MIRALAX / GLYCOLAX) packet Take 17 g by mouth daily. For constipation 14 each 0  . traMADol (ULTRAM) 50 MG tablet Take 1 tablet (50 mg total) by mouth every 8 (eight) hours as needed. (Patient not taking: Reported on 12/10/2016) 90 tablet 1   No current facility-administered medications for this visit.     Review of Systems : See HPI for pertinent positives and negatives.  Physical Examination  Vitals:   12/10/16 1426 12/10/16 1429  BP: 137/76 133/72  Pulse: 60   Resp: 20   Temp: 97.4 F (36.3 C)   TempSrc: Oral   SpO2: 94%   Weight: 184 lb (83.5 kg)   Height: 5\' 3"  (1.6 m)    Body mass index is 32.59 kg/m.  General: WDWN obese  female in NAD GAIT: normal Eyes: PERRLA Pulmonary:  Respirations are non-labored, CTAB, distant breath sounds in posterior fields, no rales, rhonchi, or wheezes  Cardiac: regular rhythm, no detected murmur.  VASCULAR EXAM Carotid Bruits Right Left   Negative Negative    Aorta is not palpable. Radial pulses are 2+ palpable and equal.                                                                                                                                          LE Pulses Right Left       POPLITEAL  not palpable   not palpable       POSTERIOR TIBIAL  not palpable   not palpable        DORSALIS PEDIS      ANTERIOR TIBIAL faintly palpable  faintly palpable      Gastrointestinal: soft, nontender, BS WNL, no r/g,  negative palpable masses.  Musculoskeletal: no muscle atrophy/wasting. M/S 5/5 throughout, extremities without ischemic changes.  Neurologic: A&O X 3; Appropriate Affect, Speech is normal CN 2-12 intact, pain and light touch intact in extremities, Motor exam as listed above.     Assessment: Tammy Barron is a 76 y.o. female  who had a posterior distribution stroke in April 2015. She had a carotid duplex scan at that time which showed 60-80% right internal carotid artery stenosis. She also had paroxysmal atrial fibrillation. She was placed on Pradaxa at that time. Pt states she has had no stroke or TIA symptoms since April 2015.  However she does have residual dizziness which she states is either orthostatic or related to her prior stroke.   Her atherosclerotic risk factors include 51 years smoking, dyslipidemia, hypertension, and obesity. Fortunately she doe not have DM, and is somewhat physically active. She takes Pradaxa for atrial fib.   DATA Today's carotid duplex suggests 60 - 79%  upper end of range right internal carotid artery stenosis. Less than 40% left internal carotid artery stenosis, calcific plaque may obscure higher velocity. Right vertebral artery flow is retrograde, left is antegrade. Bilateral subclavian artery waveforms are triphasic. No significant change compared to the last exam on 04-09-16.   Plan: Follow-up in 6 months with Carotid Duplex scan.  The patient was counseled re smoking cessation and given several free resources re smoking cessation.   I discussed in depth with the patient the nature of atherosclerosis, and emphasized the importance of maximal medical management including strict control of blood pressure, blood glucose, and lipid levels, obtaining regular exercise, and cessation of smoking.  The patient is aware that without maximal medical management the underlying atherosclerotic  disease process will progress, limiting the benefit of any interventions. The patient was given information about stroke prevention and what symptoms should prompt the patient to seek immediate medical care. Thank you for allowing [place] to participate in this patient's care.  Tammy Chessman Marchell Froman,  RN, MSN, FNP-C [facility] Office: [phone]  Clinic Physician: Imogene BurnChen  12/10/16 2:34 PM

## 2016-12-10 NOTE — Patient Instructions (Signed)
Stroke Prevention Some medical conditions and behaviors are associated with an increased chance of having a stroke. You may prevent a stroke by making healthy choices and managing medical conditions. How can I reduce my risk of having a stroke?  Stay physically active. Get at least 30 minutes of activity on most or all days.  Do not smoke. It may also be helpful to avoid exposure to secondhand smoke.  Limit alcohol use. Moderate alcohol use is considered to be:  No more than 2 drinks per day for men.  No more than 1 drink per day for nonpregnant women.  Eat healthy foods. This involves:  Eating 5 or more servings of fruits and vegetables a day.  Making dietary changes that address high blood pressure (hypertension), high cholesterol, diabetes, or obesity.  Manage your cholesterol levels.  Making food choices that are high in fiber and low in saturated fat, trans fat, and cholesterol may control cholesterol levels.  Take any prescribed medicines to control cholesterol as directed by your health care provider.  Manage your diabetes.  Controlling your carbohydrate and sugar intake is recommended to manage diabetes.  Take any prescribed medicines to control diabetes as directed by your health care provider.  Control your hypertension.  Making food choices that are low in salt (sodium), saturated fat, trans fat, and cholesterol is recommended to manage hypertension.  Ask your health care provider if you need treatment to lower your blood pressure. Take any prescribed medicines to control hypertension as directed by your health care provider.  If you are 18-39 years of age, have your blood pressure checked every 3-5 years. If you are 40 years of age or older, have your blood pressure checked every year.  Maintain a healthy weight.  Reducing calorie intake and making food choices that are low in sodium, saturated fat, trans fat, and cholesterol are recommended to manage  weight.  Stop drug abuse.  Avoid taking birth control pills.  Talk to your health care provider about the risks of taking birth control pills if you are over 35 years old, smoke, get migraines, or have ever had a blood clot.  Get evaluated for sleep disorders (sleep apnea).  Talk to your health care provider about getting a sleep evaluation if you snore a lot or have excessive sleepiness.  Take medicines only as directed by your health care provider.  For some people, aspirin or blood thinners (anticoagulants) are helpful in reducing the risk of forming abnormal blood clots that can lead to stroke. If you have the irregular heart rhythm of atrial fibrillation, you should be on a blood thinner unless there is a good reason you cannot take them.  Understand all your medicine instructions.  Make sure that other conditions (such as anemia or atherosclerosis) are addressed. Get help right away if:  You have sudden weakness or numbness of the face, arm, or leg, especially on one side of the body.  Your face or eyelid droops to one side.  You have sudden confusion.  You have trouble speaking (aphasia) or understanding.  You have sudden trouble seeing in one or both eyes.  You have sudden trouble walking.  You have dizziness.  You have a loss of balance or coordination.  You have a sudden, severe headache with no known cause.  You have new chest pain or an irregular heartbeat. Any of these symptoms may represent a serious problem that is an emergency. Do not wait to see if the symptoms will go away.   Get medical help at once. Call your local emergency services (911 in [place] Do not drive yourself to the hospital. This information is not intended to replace advice given to you by your health care provider. Make sure you discuss any questions you have with your health care provider. Document Released: 11/26/2004 Document Revised: 03/26/2016 Document Reviewed: 04/21/2013 Elsevier  Interactive Patient Education  2017 Elsevier Inc.  

## 2016-12-11 NOTE — Addendum Note (Signed)
Addended by: Burton ApleyPETTY, Eytan Carrigan A on: 12/11/2016 04:38 PM   Modules accepted: Orders

## 2017-01-01 ENCOUNTER — Other Ambulatory Visit: Payer: Self-pay | Admitting: Internal Medicine

## 2017-01-05 ENCOUNTER — Encounter: Payer: Self-pay | Admitting: Internal Medicine

## 2017-01-05 ENCOUNTER — Telehealth: Payer: Self-pay

## 2017-01-05 ENCOUNTER — Ambulatory Visit (INDEPENDENT_AMBULATORY_CARE_PROVIDER_SITE_OTHER): Payer: Medicare Other | Admitting: Internal Medicine

## 2017-01-05 VITALS — BP 120/70 | HR 77 | Temp 97.7°F | Ht 63.0 in | Wt 185.2 lb

## 2017-01-05 DIAGNOSIS — F17200 Nicotine dependence, unspecified, uncomplicated: Secondary | ICD-10-CM

## 2017-01-05 DIAGNOSIS — I10 Essential (primary) hypertension: Secondary | ICD-10-CM | POA: Diagnosis not present

## 2017-01-05 DIAGNOSIS — I480 Paroxysmal atrial fibrillation: Secondary | ICD-10-CM | POA: Diagnosis not present

## 2017-01-05 DIAGNOSIS — E785 Hyperlipidemia, unspecified: Secondary | ICD-10-CM

## 2017-01-05 DIAGNOSIS — I69993 Ataxia following unspecified cerebrovascular disease: Secondary | ICD-10-CM | POA: Diagnosis not present

## 2017-01-05 DIAGNOSIS — D696 Thrombocytopenia, unspecified: Secondary | ICD-10-CM | POA: Diagnosis not present

## 2017-01-05 LAB — COMPREHENSIVE METABOLIC PANEL
ALK PHOS: 46 U/L (ref 39–117)
ALT: 11 U/L (ref 0–35)
AST: 16 U/L (ref 0–37)
Albumin: 3.9 g/dL (ref 3.5–5.2)
BUN: 15 mg/dL (ref 6–23)
CO2: 33 mEq/L — ABNORMAL HIGH (ref 19–32)
Calcium: 9.3 mg/dL (ref 8.4–10.5)
Chloride: 103 mEq/L (ref 96–112)
Creatinine, Ser: 0.92 mg/dL (ref 0.40–1.20)
GFR: 62.8 mL/min (ref >60.00)
GLUCOSE: 83 mg/dL (ref 70–99)
POTASSIUM: 4.3 meq/L (ref 3.5–5.1)
Sodium: 140 mEq/L (ref 135–145)
TOTAL PROTEIN: 6.5 g/dL (ref 6.0–8.3)
Total Bilirubin: 0.4 mg/dL (ref 0.2–1.2)

## 2017-01-05 LAB — LIPID PANEL
CHOL/HDL RATIO: 3
Cholesterol: 122 mg/dL (ref 0–200)
HDL: 41.2 mg/dL (ref >39.00)
NonHDL: 80.82
TRIGLYCERIDES: 246 mg/dL — AB (ref 0.0–149.0)
VLDL: 49.2 mg/dL — ABNORMAL HIGH (ref 0.0–40.0)

## 2017-01-05 LAB — CBC WITH DIFFERENTIAL/PLATELET
Basophils Absolute: 0 10*3/uL (ref 0.0–0.1)
Basophils Relative: 0.5 % (ref 0.0–3.0)
EOS PCT: 4.2 % (ref 0.0–5.0)
Eosinophils Absolute: 0.3 10*3/uL (ref 0.0–0.7)
HEMATOCRIT: 43.2 % (ref 36.0–46.0)
HEMOGLOBIN: 14.9 g/dL (ref 12.0–15.0)
LYMPHS ABS: 2 10*3/uL (ref 0.7–4.0)
LYMPHS PCT: 25.3 % (ref 12.0–46.0)
MCHC: 34.4 g/dL (ref 30.0–36.0)
MCV: 90.8 fl (ref 78.0–100.0)
MONOS PCT: 9.1 % (ref 3.0–12.0)
Monocytes Absolute: 0.7 10*3/uL (ref 0.1–1.0)
NEUTROS PCT: 60.9 % (ref 43.0–77.0)
Neutro Abs: 4.7 10*3/uL (ref 1.4–7.7)
Platelets: 174 10*3/uL (ref 150.0–400.0)
RBC: 4.75 Mil/uL (ref 3.87–5.11)
RDW: 13.3 % (ref 11.5–15.5)
WBC: 7.8 10*3/uL (ref 4.0–10.5)

## 2017-01-05 LAB — LDL CHOLESTEROL, DIRECT: Direct LDL: 38 mg/dL

## 2017-01-05 LAB — TSH: TSH: 2.04 u[IU]/mL (ref 0.35–4.50)

## 2017-01-05 MED ORDER — BISOPROLOL-HYDROCHLOROTHIAZIDE 10-6.25 MG PO TABS: 1.0000 | ORAL_TABLET | Freq: Every day | ORAL | 1 refills | Status: DC

## 2017-01-05 MED ORDER — CLORAZEPATE DIPOTASSIUM 3.75 MG PO TABS: 3.7500 mg | ORAL_TABLET | Freq: Two times a day (BID) | ORAL | 2 refills | Status: DC | PRN

## 2017-01-05 MED ORDER — DICLOFENAC SODIUM 1 % TD GEL: 2.0000 g | Freq: Four times a day (QID) | TRANSDERMAL | 6 refills | Status: DC

## 2017-01-05 NOTE — Patient Instructions (Signed)
Limit your sodium (Salt) intake    It is important that you exercise regularly, at least 20 minutes 3 to 4 times per week.  If you develop chest pain or shortness of breath seek  medical attention.  You need to lose weight.  Consider a lower calorie diet and regular exercise.  Return in 6 months for follow-up  Smoking tobacco is very bad for your health. You should stop smoking immediately.

## 2017-01-05 NOTE — Progress Notes (Signed)
Pre visit review using our clinic review tool, if applicable. No additional management support is needed unless otherwise documented below in the visit note. 

## 2017-01-05 NOTE — Progress Notes (Signed)
Subjective:    Patient ID: Tammy Barron, female    DOB: [birth year], 76 y.o.   MRN: [id number]  HPI 76 year old patient who is seen today for follow-up.  She has not been seen by me in over one year. Medical problems include essential hypertension and prior sural vascular disease.  She has a history of paroxysmal atrial fibrillation and prior cerebellar stroke.  She remains on statin therapy as well as anticoagulation Doing fairly well.  Only complaints today include some arthritic pain, especially involving the small joints of the hands.  She is requesting a refill on Voltaren gel  Past Medical History:  Diagnosis Date  . Anemia   . Anxiety   . Atrial fibrillation (HCC)   . Depression   . Hx of abnormal Pap smear   . Hyperlipidemia   . Hypertension      Social History   Social History  . Marital status: Widowed    Spouse name: N/A  . Number of children: N/A  . Years of education: N/A   Occupational History  . Not on file.   Social History Main Topics  . Smoking status: Current Some Day Smoker    Packs/day: 1.00    Years: 40.00    Types: Cigarettes  . Smokeless tobacco: Never Used     Comment: Occasionally  . Alcohol use No  . Drug use: Unknown  . Sexual activity: Not on file   Other Topics Concern  . Not on file   Social History Narrative  . No narrative on file    Past Surgical History:  Procedure Laterality Date  . ABDOMINAL HYSTERECTOMY  1982    Family History  Problem Relation Age of Onset  . Heart disease Mother   . Heart attack Father     Allergies  Allergen Reactions  . Latex Rash    Current Outpatient Prescriptions on File Prior to Visit  Medication Sig Dispense Refill  . acetaminophen (TYLENOL) 500 MG tablet Take 500 mg by mouth every 6 (six) hours as needed.    Marland Kitchen. atorvastatin (LIPITOR) 10 MG tablet TAKE 1 TABLET BY MOUTH EVERY DAY AT 6 PM 90 tablet 1  . dabigatran (PRADAXA) 150 MG CAPS capsule Take 150 mg by mouth 2 (two) times  daily.    . Multiple Vitamin (MULTIVITAMIN) tablet Take 1 tablet by mouth daily.    . polyethylene glycol (MIRALAX / GLYCOLAX) packet Take 17 g by mouth daily. For constipation 14 each 0   No current facility-administered medications on file prior to visit.     BP 120/70 (BP Location: Left Arm, Patient Position: Sitting, Cuff Size: Normal)   Pulse 77   Temp 97.7 F (36.5 C) (Oral)   Ht 5\' 3"  (1.6 m)   Wt 185 lb 3.2 oz (84 kg)   SpO2 98%   BMI 32.81 kg/m     Review of Systems  Constitutional: Negative.   HENT: Negative for congestion, dental problem, hearing loss, rhinorrhea, sinus pressure, sore throat and tinnitus.   Eyes: Negative for pain, discharge and visual disturbance.  Respiratory: Negative for cough and shortness of breath.   Cardiovascular: Negative for chest pain, palpitations and leg swelling.  Gastrointestinal: Negative for abdominal distention, abdominal pain, blood in stool, constipation, diarrhea, nausea and vomiting.  Genitourinary: Negative for difficulty urinating, dysuria, flank pain, frequency, hematuria, pelvic pain, urgency, vaginal bleeding, vaginal discharge and vaginal pain.  Musculoskeletal: Positive for arthralgias. Negative for gait problem and joint swelling.  Skin: Negative for  rash.  Neurological: Negative for dizziness, syncope, speech difficulty, weakness, numbness and headaches.  Hematological: Negative for adenopathy.  Psychiatric/Behavioral: Negative for agitation, behavioral problems and dysphoric mood. The patient is not nervous/anxious.        Objective:   Physical Exam  Constitutional: She is oriented to person, place, and time. She appears well-developed and well-nourished.  Blood pressure low normal Weight 185  HENT:  Head: Normocephalic.  Right Ear: External ear normal.  Left Ear: External ear normal.  Mouth/Throat: Oropharynx is clear and moist.  Eyes: Conjunctivae and EOM are normal. Pupils are equal, round, and reactive to  light.  Neck: Normal range of motion. Neck supple. No thyromegaly present.  Cardiovascular: Normal rate, regular rhythm, normal heart sounds and intact distal pulses.   Pulmonary/Chest: Effort normal and breath sounds normal.  Abdominal: Soft. Bowel sounds are normal. She exhibits no mass. There is no tenderness.  Musculoskeletal: Normal range of motion.  Osteoarthritic changes involving the small joints of the hands  Lymphadenopathy:    She has no cervical adenopathy.  Neurological: She is alert and oriented to person, place, and time.  Skin: Skin is warm and dry. No rash noted.  Psychiatric: She has a normal mood and affect. Her behavior is normal.          Assessment & Plan:   Essential hypertension, well-controlled Cerebral vascular disease.  Continue statin therapy and full anticoagulation History of paroxysmal atrial fibrillation Osteoarthritis  We'll check updated lab All medications refilled CPX 4 months  Erikka Follmer Homero Fellers

## 2017-01-05 NOTE — Telephone Encounter (Signed)
Received PA request from [facility] for Diclofenac Sodium. PA submitted & is pending. Key: Marg[id number]

## 2017-01-13 NOTE — Telephone Encounter (Signed)
PA approved, form faxed back to pharmacy. 

## 2017-02-03 ENCOUNTER — Other Ambulatory Visit: Payer: Self-pay | Admitting: Internal Medicine

## 2017-05-25 ENCOUNTER — Encounter: Payer: Self-pay | Admitting: Family

## 2017-05-26 ENCOUNTER — Other Ambulatory Visit: Payer: Self-pay | Admitting: Internal Medicine

## 2017-06-10 ENCOUNTER — Encounter: Payer: Self-pay | Admitting: Family

## 2017-06-10 ENCOUNTER — Ambulatory Visit (INDEPENDENT_AMBULATORY_CARE_PROVIDER_SITE_OTHER): Payer: Medicare Other | Admitting: Family

## 2017-06-10 ENCOUNTER — Ambulatory Visit (HOSPITAL_COMMUNITY)
Admission: RE | Admit: 2017-06-10 | Discharge: 2017-06-10 | Disposition: A | Discharge status: Home / Self Care | Payer: Medicare Other | Source: Ambulatory Visit | Attending: Vascular Surgery | Admitting: Vascular Surgery

## 2017-06-10 VITALS — BP 146/83 | HR 62 | Temp 97.4°F | Resp 18 | Ht 63.0 in | Wt 187.0 lb

## 2017-06-10 DIAGNOSIS — Z8673 Personal history of transient ischemic attack (TIA), and cerebral infarction without residual deficits: Secondary | ICD-10-CM | POA: Diagnosis present

## 2017-06-10 DIAGNOSIS — I6521 Occlusion and stenosis of right carotid artery: Secondary | ICD-10-CM | POA: Diagnosis present

## 2017-06-10 DIAGNOSIS — F17200 Nicotine dependence, unspecified, uncomplicated: Secondary | ICD-10-CM

## 2017-06-10 DIAGNOSIS — I6523 Occlusion and stenosis of bilateral carotid arteries: Secondary | ICD-10-CM | POA: Diagnosis not present

## 2017-06-10 LAB — VAS US CAROTID
LCCADSYS: -79 cm/s
LCCAPDIAS: 15 cm/s
LCCAPSYS: 100 cm/s
LEFT ECA DIAS: -4 cm/s
LEFT VERTEBRAL DIAS: -20 cm/s
LICAPDIAS: 26 cm/s
Left CCA dist dias: -14 cm/s
Left ICA prox sys: 119 cm/s
RCCAPDIAS: 15 cm/s
RIGHT CCA MID DIAS: 12 cm/s
RIGHT ECA DIAS: -9 cm/s
Right CCA prox sys: 82 cm/s
Right cca dist sys: -94 cm/s

## 2017-06-10 NOTE — Progress Notes (Signed)
Chief Complaint: Follow up Extracranial Carotid Artery Stenosis   History of Present Illness  Tammy ClarksMargaret L Barron is a 76 y.o. female whopresents for follow up evaluation of right internal carotid artery stenosis. Dr. Darrick PennaFields evaluated her initially. The patient did have a posterior distribution stroke in April 2015. She had a carotid duplex scan at that time which showed 60-80% right internal carotid artery stenosis. She also had paroxysmal atrial fibrillation. She was placed on Pradaxa at that time.    Pt states she has had no stroke or TIA symptoms since April 2015. However she does have residual dizziness which she states is either orthostatic or related to her prior stroke. Other medical problems include hypertension, hyperlipidemia which are both stable.   She has nothad previous carotid artery intervention.  Pt is active in [employer] 3-4 days/week, if the weather is nice.   She states she finished her extensive dental work.   Pt Diabetic: no Pt smoker: smoker (1 pack/week, started at age 76 yrs)  Pt meds include: Statin : yes ASA: no Other anticoagulants/antiplatelets: Pradaxa for atrial fib, her cardiologist is Dr. Jacinto HalimGanji   Past Medical History:  Diagnosis Date  . Anemia   . Anxiety   . Atrial fibrillation (HCC)   . Depression   . Hx of abnormal Pap smear   . Hyperlipidemia   . Hypertension     Social History Social History  Substance Use Topics  . Smoking status: Current Some Day Smoker    Packs/day: 1.00    Years: 40.00    Types: Cigarettes  . Smokeless tobacco: Never Used     Comment: Occasionally  . Alcohol use No    Family History Family History  Problem Relation Age of Onset  . Heart disease Mother   . Heart attack Father     Surgical History Past Surgical History:  Procedure Laterality Date  . ABDOMINAL HYSTERECTOMY  1982    Allergies  Allergen Reactions  . Latex Rash    Current Outpatient Prescriptions  Medication Sig  Dispense Refill  . acetaminophen (TYLENOL) 500 MG tablet Take 500 mg by mouth every 6 (six) hours as needed.    Marland Kitchen. atorvastatin (LIPITOR) 10 MG tablet TAKE 1 TABLET BY MOUTH EVERY DAY AT 6 PM 90 tablet 1  . bisoprolol-hydrochlorothiazide (ZIAC) 10-6.25 MG tablet Take 1 tablet by mouth daily. 90 tablet 1  . clorazepate (TRANXENE) 3.75 MG tablet TAKE 1 TABLET BY MOUTH TWICE A DAY AS NEEDED 60 tablet 2  . dabigatran (PRADAXA) 150 MG CAPS capsule Take 150 mg by mouth 2 (two) times daily.    . diclofenac sodium (VOLTAREN) 1 % GEL Apply 2 g topically 4 (four) times daily. 100 g 6  . Multiple Vitamin (MULTIVITAMIN) tablet Take 1 tablet by mouth daily.    . polyethylene glycol (MIRALAX / GLYCOLAX) packet Take 17 g by mouth daily. For constipation 14 each 0   No current facility-administered medications for this visit.     Review of Systems : See HPI for pertinent positives and negatives.  Physical Examination  Vitals:   06/10/17 1601 06/10/17 1603  BP: 137/77 (!) 146/83  Pulse: 62   Resp: 18   Temp: (!) 97.4 F (36.3 C)   TempSrc: Oral   SpO2: 96%   Weight: 187 lb (84.8 kg)   Height: 5\' 3"  (1.6 m)    Body mass index is 33.13 kg/m.  General: WDWN obese female in NAD GAIT:normal Eyes: PERRLA Pulmonary: Respirations are non-labored,  CTAB, distant breath sounds in posterior fields, no rales, rhonchi, or wheezes  Cardiac: regular rhythm and rate, no detected murmur.  VASCULAR EXAM Carotid Bruits Right Left   Negative Negative   Abdominal aortic pulse is not palpable. Radial pulses are 2+ palpable and equal.   LE Pulses Right Left  POPLITEAL not palpable  not palpable  POSTERIOR TIBIAL not palpable  not palpable   DORSALIS PEDIS ANTERIOR TIBIAL faintly palpable  faintly palpable     Gastrointestinal: soft, nontender, BS WNL, no r/g, nopalpable masses.  Musculoskeletal: no muscle atrophy/wasting. M/S 5/5  throughout, extremities without ischemic changes.  Neurologic: A&O X 3; appropriate affect, speech is normal, CN 2-12 intact, pain and light touch intact in extremities, Motor exam as listed above     Assessment: Tammy Barron is a 76 y.o. female who had a posterior distribution stroke in April 2015. She had a carotid duplex scan at that time which showed 60-80% right internal carotid artery stenosis. She also had paroxysmal atrial fibrillation. She was placed on Pradaxa at that time. Pt states she has had no stroke or TIA symptoms since April 2015.  However she does have residual dizziness which she states is either orthostatic or related to her prior stroke.   Her atherosclerotic risk factors include 62 years smoking, dyslipidemia, hypertension, and obesity. Fortunately she doe not have DM, and is somewhat physically active. She takes Pradaxa for atrial fib.   DATA Carotid Duplex (06/10/17): 60 - 79% right internal carotid artery stenosis. <40% left internal carotid artery stenosis, calcific plaque may obscure higher velocity. Right vertebral artery flow is retrograde, left is antegrade. Bilateral subclavian artery waveforms are triphasic. No significant change compared to the last exams on 04-09-16 and 12-10-16.    Plan: Follow-up in Carotid Duplex scan.  The patient was counseled re smoking cessation and given several free resources re smoking cessation   I discussed in depth with the patient the nature of atherosclerosis, and emphasized the importance of maximal medical management including strict control of blood pressure, blood glucose, and lipid levels, obtaining regular exercise, and cessation of smoking.  The patient is aware that without maximal medical management the underlying atherosclerotic disease process will progress, limiting the benefit of any interventions. The patient was given information about stroke prevention and what symptoms should prompt  the patient to seek immediate medical care. Thank you for allowing [place] to participate in this patient's care.  Charisse March, RN, MSN, FNP-C [facility] Office: [phone]  Clinic Physician: Darrick Penna  06/10/17 4:09 PM

## 2017-06-10 NOTE — Patient Instructions (Signed)
Steps to Quit Smoking Smoking tobacco can be bad for your health. It can also affect almost every organ in your body. Smoking puts you and people around you at risk for many serious long-lasting (chronic) diseases. Quitting smoking is hard, but it is one of the best things that you can do for your health. It is never too late to quit. What are the benefits of quitting smoking? When you quit smoking, you lower your risk for getting serious diseases and conditions. They can include:  Lung cancer or lung disease.  Heart disease.  Stroke.  Heart attack.  Not being able to have children (infertility).  Weak bones (osteoporosis) and broken bones (fractures).  If you have coughing, wheezing, and shortness of breath, those symptoms may get better when you quit. You may also get sick less often. If you are pregnant, quitting smoking can help to lower your chances of having a baby of low birth weight. What can I do to help me quit smoking? Talk with your doctor about what can help you quit smoking. Some things you can do (strategies) include:  Quitting smoking totally, instead of slowly cutting back how much you smoke over a period of time.  Going to in-person counseling. You are more likely to quit if you go to many counseling sessions.  Using resources and support systems, such as: ? Online chats with a counselor. ? Phone quitlines. ? Printed self-help materials. ? Support groups or group counseling. ? Text messaging programs. ? Mobile phone apps or applications.  Taking medicines. Some of these medicines may have nicotine in them. If you are pregnant or breastfeeding, do not take any medicines to quit smoking unless your doctor says it is okay. Talk with your doctor about counseling or other things that can help you.  Talk with your doctor about using more than one strategy at the same time, such as taking medicines while you are also going to in-person counseling. This can help make  quitting easier. What things can I do to make it easier to quit? Quitting smoking might feel very hard at first, but there is a lot that you can do to make it easier. Take these steps:  Talk to your family and friends. Ask them to support and encourage you.  Call phone quitlines, reach out to support groups, or work with a counselor.  Ask people who smoke to not smoke around you.  Avoid places that make you want (trigger) to smoke, such as: ? Bars. ? Parties. ? Smoke-break areas at work.  Spend time with people who do not smoke.  Lower the stress in your life. Stress can make you want to smoke. Try these things to help your stress: ? Getting regular exercise. ? Deep-breathing exercises. ? Yoga. ? Meditating. ? Doing a body scan. To do this, close your eyes, focus on one area of your body at a time from head to toe, and notice which parts of your body are tense. Try to relax the muscles in those areas.  Download or buy apps on your mobile phone or tablet that can help you stick to your quit plan. There are many free apps, such as QuitGuide from the CDC (Centers for Disease Control and Prevention). You can find more support from smokefree.gov and other websites.  This information is not intended to replace advice given to you by your health care provider. Make sure you discuss any questions you have with your health care provider. Document Released: 08/15/2009 Document   Revised: 06/16/2016 Document Reviewed: 03/05/2015 Elsevier Interactive Patient Education  2018 Elsevier Inc.     Stroke Prevention Some medical conditions and behaviors are associated with an increased chance of having a stroke. You may prevent a stroke by making healthy choices and managing medical conditions. How can I reduce my risk of having a stroke?  Stay physically active. Get at least 30 minutes of activity on most or all days.  Do not smoke. It may also be helpful to avoid exposure to secondhand  smoke.  Limit alcohol use. Moderate alcohol use is considered to be: ? No more than 2 drinks per day for men. ? No more than 1 drink per day for nonpregnant women.  Eat healthy foods. This involves: ? Eating 5 or more servings of fruits and vegetables a day. ? Making dietary changes that address high blood pressure (hypertension), high cholesterol, diabetes, or obesity.  Manage your cholesterol levels. ? Making food choices that are high in fiber and low in saturated fat, trans fat, and cholesterol may control cholesterol levels. ? Take any prescribed medicines to control cholesterol as directed by your health care provider.  Manage your diabetes. ? Controlling your carbohydrate and sugar intake is recommended to manage diabetes. ? Take any prescribed medicines to control diabetes as directed by your health care provider.  Control your hypertension. ? Making food choices that are low in salt (sodium), saturated fat, trans fat, and cholesterol is recommended to manage hypertension. ? Ask your health care provider if you need treatment to lower your blood pressure. Take any prescribed medicines to control hypertension as directed by your health care provider. ? If you are 18-39 years of age, have your blood pressure checked every 3-5 years. If you are 40 years of age or older, have your blood pressure checked every year.  Maintain a healthy weight. ? Reducing calorie intake and making food choices that are low in sodium, saturated fat, trans fat, and cholesterol are recommended to manage weight.  Stop drug abuse.  Avoid taking birth control pills. ? Talk to your health care provider about the risks of taking birth control pills if you are over 35 years old, smoke, get migraines, or have ever had a blood clot.  Get evaluated for sleep disorders (sleep apnea). ? Talk to your health care provider about getting a sleep evaluation if you snore a lot or have excessive sleepiness.  Take  medicines only as directed by your health care provider. ? For some people, aspirin or blood thinners (anticoagulants) are helpful in reducing the risk of forming abnormal blood clots that can lead to stroke. If you have the irregular heart rhythm of atrial fibrillation, you should be on a blood thinner unless there is a good reason you cannot take them. ? Understand all your medicine instructions.  Make sure that other conditions (such as anemia or atherosclerosis) are addressed. Get help right away if:  You have sudden weakness or numbness of the face, arm, or leg, especially on one side of the body.  Your face or eyelid droops to one side.  You have sudden confusion.  You have trouble speaking (aphasia) or understanding.  You have sudden trouble seeing in one or both eyes.  You have sudden trouble walking.  You have dizziness.  You have a loss of balance or coordination.  You have a sudden, severe headache with no known cause.  You have new chest pain or an irregular heartbeat. Any of these symptoms may represent   a serious problem that is an emergency. Do not wait to see if the symptoms will go away. Get medical help at once. Call your local emergency services (911 in [place] Do not drive yourself to the hospital. This information is not intended to replace advice given to you by your health care provider. Make sure you discuss any questions you have with your health care provider. Document Released: 11/26/2004 Document Revised: 03/26/2016 Document Reviewed: 04/21/2013 Elsevier Interactive Patient Education  2017 Elsevier Inc.  

## 2017-06-18 NOTE — Addendum Note (Signed)
Addended by: Burton Apley A on: 06/18/2017 01:15 PM   Modules accepted: Orders

## 2017-07-22 ENCOUNTER — Encounter: Payer: Self-pay | Admitting: Internal Medicine

## 2017-08-04 ENCOUNTER — Other Ambulatory Visit: Payer: Self-pay | Admitting: Internal Medicine

## 2017-10-06 ENCOUNTER — Other Ambulatory Visit: Payer: Self-pay | Admitting: Internal Medicine

## 2017-12-30 ENCOUNTER — Ambulatory Visit: Payer: Medicare Other | Admitting: Family

## 2017-12-30 ENCOUNTER — Encounter (HOSPITAL_COMMUNITY): Payer: Medicare Other

## 2018-01-19 ENCOUNTER — Other Ambulatory Visit: Payer: Self-pay | Admitting: Internal Medicine

## 2018-02-24 ENCOUNTER — Encounter (HOSPITAL_COMMUNITY): Payer: Medicare Other

## 2018-02-24 ENCOUNTER — Other Ambulatory Visit: Payer: Self-pay | Admitting: Internal Medicine

## 2018-02-24 ENCOUNTER — Ambulatory Visit: Payer: Medicare Other | Admitting: Family

## 2018-02-28 NOTE — Telephone Encounter (Signed)
Pt. Calling to check to status of RX refill for clorazepate (TRANXENE) 3.75 MG tablet called into  [id number] [place], [place] - [address] [phone] (Phone) [phone] (Fax)   Pharmacy needs fax verification to refill RX.

## 2018-03-01 ENCOUNTER — Other Ambulatory Visit: Payer: Self-pay | Admitting: Internal Medicine

## 2018-03-01 NOTE — Telephone Encounter (Signed)
Patient scheduled an ov for more refills.

## 2018-03-01 NOTE — Telephone Encounter (Signed)
Copied from CRM [id number]. Topic: Quick Communication - Rx Refill/Question >> Mar 01, 2018  3:20 PM Rudi Coco, [place] wrote: Medication: clorazepate (TRANXENE) 3.75 MG tablet [[id number]]  Has the patient contacted their pharmacy? yes (Agent: If no, request that the patient contact the pharmacy for the refill.) Preferred Pharmacy (with phone number or street name): [id number] [address] [place] [address] Phone: [phone] Fax: [phone]   Agent: Please be advised that RX refills may take up to 3 business days. We ask that you follow-up with your pharmacy.

## 2018-03-01 NOTE — Telephone Encounter (Signed)
LOV 01/05/17 Dr. Amador Cunas Last refill 10/07/17  #60 with 2 refills

## 2018-03-02 NOTE — Telephone Encounter (Signed)
clorazepate refill Last OV: 05/16/13 Rx request refused until pt has OV: Pt has appt made for 03/07/18 Last Refill:10/07/17 #60 2 RF [facility]acy:[facility] [address] Amador Cunas

## 2018-03-07 ENCOUNTER — Ambulatory Visit: Payer: Medicare Other | Admitting: Internal Medicine

## 2018-03-07 ENCOUNTER — Encounter: Payer: Self-pay | Admitting: Internal Medicine

## 2018-03-07 VITALS — BP 110/50 | HR 68 | Temp 98.1°F | Wt 175.0 lb

## 2018-03-07 DIAGNOSIS — I10 Essential (primary) hypertension: Secondary | ICD-10-CM | POA: Diagnosis not present

## 2018-03-07 DIAGNOSIS — E785 Hyperlipidemia, unspecified: Secondary | ICD-10-CM

## 2018-03-07 DIAGNOSIS — I480 Paroxysmal atrial fibrillation: Secondary | ICD-10-CM

## 2018-03-07 DIAGNOSIS — I639 Cerebral infarction, unspecified: Secondary | ICD-10-CM

## 2018-03-07 MED ORDER — CLORAZEPATE DIPOTASSIUM 3.75 MG PO TABS: ORAL_TABLET | ORAL | 2 refills | Status: DC

## 2018-03-07 NOTE — Patient Instructions (Signed)
Limit your sodium (Salt) intake    It is important that you exercise regularly, at least 20 minutes 3 to 4 times per week.  If you develop chest pain or shortness of breath seek  medical attention.  Please check your blood pressure on a regular basis.  If it is consistently greater than 150/90, please make an office appointment.  Return in 6 months for follow-up   

## 2018-03-07 NOTE — Progress Notes (Signed)
Subjective:    Patient ID: Tammy Barron, female    DOB: [birth year], 77 y.o.   MRN: [id number]  HPI  77 year old patient who is seen today in follow-up.  She has a history of essential hypertension.  She remains on chronic anticoagulation with a history of atrial fibrillation and prior cerebellar stroke.  She remains on statin therapy. Doing quite well without concerns or complaints  Past Medical History:  Diagnosis Date  . Anemia   . Anxiety   . Atrial fibrillation (HCC)   . Depression   . Hx of abnormal Pap smear   . Hyperlipidemia   . Hypertension      Social History   Socioeconomic History  . Marital status: Widowed    Spouse name: Not on file  . Number of children: Not on file  . Years of education: Not on file  . Highest education level: Not on file  Occupational History  . Not on file  Social Needs  . Financial resource strain: Not on file  . Food insecurity:    Worry: Not on file    Inability: Not on file  . Transportation needs:    Medical: Not on file    Non-medical: Not on file  Tobacco Use  . Smoking status: Current Some Day Smoker    Packs/day: 1.00    Years: 40.00    Pack years: 40.00    Types: Cigarettes  . Smokeless tobacco: Never Used  . Tobacco comment: Occasionally  Substance and Sexual Activity  . Alcohol use: No    Alcohol/week: 0.0 oz  . Drug use: Not on file  . Sexual activity: Not on file  Lifestyle  . Physical activity:    Days per week: Not on file    Minutes per session: Not on file  . Stress: Not on file  Relationships  . Social connections:    Talks on phone: Not on file    Gets together: Not on file    Attends religious service: Not on file    Active member of club or organization: Not on file    Attends meetings of clubs or organizations: Not on file    Relationship status: Not on file  . Intimate partner violence:    Fear of current or ex partner: Not on file    Emotionally abused: Not on file    Physically  abused: Not on file    Forced sexual activity: Not on file  Other Topics Concern  . Not on file  Social History Narrative  . Not on file    Past Surgical History:  Procedure Laterality Date  . ABDOMINAL HYSTERECTOMY  1982    Family History  Problem Relation Age of Onset  . Heart disease Mother   . Heart attack Father     Allergies  Allergen Reactions  . Latex Rash    Current Outpatient Medications on File Prior to Visit  Medication Sig Dispense Refill  . acetaminophen (TYLENOL) 500 MG tablet Take 500 mg by mouth every 6 (six) hours as needed.    Marland Kitchen atorvastatin (LIPITOR) 10 MG tablet TAKE 1 TABLET BY MOUTH EVERY DAY AT 6 PM 90 tablet 1  . bisoprolol-hydrochlorothiazide (ZIAC) 10-6.25 MG tablet Take 1 tablet by mouth daily. 90 tablet 1  . dabigatran (PRADAXA) 150 MG CAPS capsule Take 150 mg by mouth 2 (two) times daily.    . diclofenac sodium (VOLTAREN) 1 % GEL Apply 2 g topically 4 (four) times daily. 100 g  6  . Multiple Vitamin (MULTIVITAMIN) tablet Take 1 tablet by mouth daily.    . polyethylene glycol (MIRALAX / GLYCOLAX) packet Take 17 g by mouth daily. For constipation 14 each 0   No current facility-administered medications on file prior to visit.     BP (!) 110/50 (BP Location: Right Arm, Patient Position: Sitting, Cuff Size: Large)   Pulse 68   Temp 98.1 F (36.7 C) (Oral)   Wt 175 lb (79.4 kg)   SpO2 98%   BMI 31.00 kg/m     Review of Systems  Constitutional: Negative.   HENT: Negative for congestion, dental problem, hearing loss, rhinorrhea, sinus pressure, sore throat and tinnitus.   Eyes: Negative for pain, discharge and visual disturbance.  Respiratory: Negative for cough and shortness of breath.   Cardiovascular: Negative for chest pain, palpitations and leg swelling.  Gastrointestinal: Negative for abdominal distention, abdominal pain, blood in stool, constipation, diarrhea, nausea and vomiting.  Genitourinary: Negative for difficulty urinating,  dysuria, flank pain, frequency, hematuria, pelvic pain, urgency, vaginal bleeding, vaginal discharge and vaginal pain.  Musculoskeletal: Negative for arthralgias, gait problem and joint swelling.  Skin: Negative for rash.  Neurological: Negative for dizziness, syncope, speech difficulty, weakness, numbness and headaches.  Hematological: Negative for adenopathy.  Psychiatric/Behavioral: Negative for agitation, behavioral problems and dysphoric mood. The patient is nervous/anxious.        Objective:   Physical Exam  Constitutional: She is oriented to person, place, and time. She appears well-developed and well-nourished.  HENT:  Head: Normocephalic.  Right Ear: External ear normal.  Left Ear: External ear normal.  Mouth/Throat: Oropharynx is clear and moist.  Eyes: Pupils are equal, round, and reactive to light. Conjunctivae and EOM are normal.  Neck: Normal range of motion. Neck supple. No thyromegaly present.  Cardiovascular: Normal rate, regular rhythm, normal heart sounds and intact distal pulses.  Pulmonary/Chest: Effort normal and breath sounds normal.  Abdominal: Soft. Bowel sounds are normal. She exhibits no mass. There is no tenderness.  Musculoskeletal: Normal range of motion.  Lymphadenopathy:    She has no cervical adenopathy.  Neurological: She is alert and oriented to person, place, and time.  Skin: Skin is warm and dry. No rash noted.  Psychiatric: She has a normal mood and affect. Her behavior is normal.          Assessment & Plan:   Essential hypertension.  Well-controlled Paroxysmal atrial fibrillation.  Continue chronic anticoagulation History of cerebellar stroke.  Continue statin therapy Anxiety disorder.  Clorazepate refilled  CPX 6 months Medications updated  Rogelia Boga

## 2018-03-12 ENCOUNTER — Other Ambulatory Visit: Payer: Self-pay | Admitting: Internal Medicine

## 2018-04-15 ENCOUNTER — Encounter (HOSPITAL_COMMUNITY): Payer: Medicare Other

## 2018-04-15 ENCOUNTER — Ambulatory Visit: Payer: Medicare Other | Admitting: Family

## 2018-05-03 ENCOUNTER — Encounter (HOSPITAL_COMMUNITY): Payer: Medicare Other

## 2018-05-03 ENCOUNTER — Ambulatory Visit: Payer: Medicare Other | Admitting: Family

## 2018-06-30 ENCOUNTER — Ambulatory Visit: Payer: Medicare Other | Admitting: Family

## 2018-06-30 ENCOUNTER — Encounter: Payer: Self-pay | Admitting: Family

## 2018-06-30 ENCOUNTER — Ambulatory Visit (HOSPITAL_COMMUNITY)
Admission: RE | Admit: 2018-06-30 | Discharge: 2018-06-30 | Disposition: A | Discharge status: Home / Self Care | Payer: Medicare Other | Source: Ambulatory Visit | Attending: Family | Admitting: Family

## 2018-06-30 VITALS — BP 153/74 | HR 59 | Temp 97.2°F | Resp 18 | Ht 63.0 in | Wt 178.0 lb

## 2018-06-30 DIAGNOSIS — Z8673 Personal history of transient ischemic attack (TIA), and cerebral infarction without residual deficits: Secondary | ICD-10-CM | POA: Insufficient documentation

## 2018-06-30 DIAGNOSIS — L570 Actinic keratosis: Secondary | ICD-10-CM | POA: Diagnosis not present

## 2018-06-30 DIAGNOSIS — F17200 Nicotine dependence, unspecified, uncomplicated: Secondary | ICD-10-CM

## 2018-06-30 DIAGNOSIS — I6521 Occlusion and stenosis of right carotid artery: Secondary | ICD-10-CM

## 2018-06-30 DIAGNOSIS — I10 Essential (primary) hypertension: Secondary | ICD-10-CM | POA: Insufficient documentation

## 2018-06-30 NOTE — Progress Notes (Signed)
Chief Complaint: Follow up Extracranial Carotid Artery Stenosis   History of Present Illness  Tammy Barron is a 77 y.o. female whopresents for follow up evaluation of right internal carotid artery stenosis. Dr. Darrick PennaFields evaluated her initially. The patient did have a posterior distribution stroke in April 2015. She had a carotid duplex scan at that time which showed 60-80% right internal carotid artery stenosis. She also hadparoxysmal atrial fibrillation. She was placed on Pradaxa at that time.   Pt states she has had no stroke or TIA symptoms since April 2015. However she does have residual dizziness which she states is either orthostatic or related to her prior stroke. Other medical problems include hypertension, hyperlipidemia which are both stable.   She has nothad previous carotid artery intervention.  She c/o occasional low back pain that radiates down both legs, started about 5 days ago.   Pt states her blood pressure at home is about 130-140/80-90.   Diabetic: no Tobacco use: smoker (1-2 cigs/day, started at age 77 yrs)  Pt meds include: Statin : yes ASA: no Other anticoagulants/antiplatelets: Pradaxa for atrial fib, her cardiologist is Dr. Jacinto HalimGanji   Past Medical History:  Diagnosis Date  . Anemia   . Anxiety   . Atrial fibrillation (HCC)   . Depression   . Hx of abnormal Pap smear   . Hyperlipidemia   . Hypertension     Social History Social History   Tobacco Use  . Smoking status: Current Some Day Smoker    Packs/day: 0.50    Years: 40.00    Pack years: 20.00    Types: Cigarettes  . Smokeless tobacco: Never Used  . Tobacco comment: 5-10 cigs a week  Substance Use Topics  . Alcohol use: No    Alcohol/week: 0.0 standard drinks  . Drug use: Never    Family History Family History  Problem Relation Age of Onset  . Heart disease Mother   . Heart attack Father     Surgical History Past Surgical History:  Procedure Laterality Date  .  ABDOMINAL HYSTERECTOMY  1982    Allergies  Allergen Reactions  . Latex Rash    Current Outpatient Medications  Medication Sig Dispense Refill  . acetaminophen (TYLENOL) 500 MG tablet Take 500 mg by mouth every 6 (six) hours as needed.    Marland Kitchen. atorvastatin (LIPITOR) 10 MG tablet TAKE 1 TABLET BY MOUTH EVERY DAY AT 6 PM 90 tablet 1  . bisoprolol-hydrochlorothiazide (ZIAC) 10-6.25 MG tablet TAKE 1 TABLET BY MOUTH DAILY. 90 tablet 1  . clorazepate (TRANXENE) 3.75 MG tablet TAKE 1 TABLET BY MOUTH 2 TIMES A DAY AS NEEDED 60 tablet 2  . dabigatran (PRADAXA) 150 MG CAPS capsule Take 150 mg by mouth 2 (two) times daily.    . diclofenac sodium (VOLTAREN) 1 % GEL Apply 2 g topically 4 (four) times daily. 100 g 6  . Multiple Vitamin (MULTIVITAMIN) tablet Take 1 tablet by mouth daily.    . polyethylene glycol (MIRALAX / GLYCOLAX) packet Take 17 g by mouth daily. For constipation 14 each 0   No current facility-administered medications for this visit.     Review of Systems : See HPI for pertinent positives and negatives.  Physical Examination  Vitals:   06/30/18 1313 06/30/18 1315  BP: (!) 162/75 (!) 153/74  Pulse: (!) 59   Resp: 18   Temp: (!) 97.2 F (36.2 C)   TempSrc: Oral   SpO2: 95%   Weight: 178 lb (80.7 kg)  Height: 5\' 3"  (1.6 m)    Body mass index is 31.53 kg/m.  General: WDWN obese female in NAD GAIT:normal HENT: no gross abnormalities.  Eyes: PERRLA Pulmonary: Respirations are non-labored, CTAB, distant breath sounds in posterior fields, no rales,rhonchi, or wheezes Cardiac: regular rhythm, bradycardic (on a beta blocker), no detected murmur.  VASCULAR EXAM Carotid Bruits Right Left   Negative Negative   Abdominal aortic pulse is not palpable. Radial pulses are 2+ palpable and equal.   LE Pulses Right Left  POPLITEAL not palpable  not palpable  POSTERIOR TIBIAL not palpable  not palpable   DORSALIS  PEDIS ANTERIOR TIBIAL faintly palpable  faintly palpable     Gastrointestinal: soft, nontender, BS WNL, no r/g, nopalpable masses. Musculoskeletal: no muscle atrophy/wasting. M/S 5/5 throughout, extremities without ischemic changes. Neurologic: A&O X 3; appropriate affect, speech is normal, CN 2-12 intact, pain and light touch intact in extremities, Motor exam as listed above Skin: No rashes, no ulcers, no cellulitis.  Actinic keratoses on hands and right lower leg. Psychiatric: Normal thought content, mood appropriate to clinical situation.    Assessment: Tammy Barron is a 77 y.o. female who had a posterior distribution stroke in April 2015. She had a carotid duplex scan at that time which showed 60-80% right internal carotid artery stenosis. She also had paroxysmal atrial fibrillation. She was placed on Pradaxa at that time. Pt states she has had no stroke or TIA symptoms since April 2015. However she does have residual dizziness which she states is either orthostatic or related to her prior stroke.   Her atherosclerotic risk factors include 62+ years smoking, dyslipidemia, hypertension, and obesity. Fortunately she doe not have DM, and is somewhat physically active. She takes Pradaxa for a history of atrial fib.   Referral to dermatologist to evaluate and manage actinic keratoses, pt requests referral.    DATA Carotid Duplex (06-30-18): Right ICA: 60-79% stenosis Left ICA: 1-39% stenosis Left vertebral artery flow is antegrade, right is not visualized.  Bilateral subclavian artery waveforms are normal.  No change in the ICA stenosis compared to the exam on 06-10-17; at that time right vertebral artery flow was retrograde, today right vertebral artery is not visualized.    Plan: Follow-up in Carotid Duplex scan.  Referral to dermatologist.   The patient was counseled re smoking cessation and given several free resources re smoking cessation  I  discussed in depth with the patient the nature of atherosclerosis, and emphasized the importance of maximal medical management including strict control of blood pressure, blood glucose, and lipid levels, obtaining regular exercise, and cessation of smoking.  The patient is aware that without maximal medical management the underlying atherosclerotic disease process will progress, limiting the benefit of any interventions. The patient was given information about stroke prevention and what symptoms should prompt the patient to seek immediate medical care. Thank you for allowing [place] to participate in this patient's care.  Charisse March, RN, MSN, FNP-C [facility] Office: [phone]  Clinic Physician: Darrick Penna  06/30/18 1:21 PM

## 2018-06-30 NOTE — Patient Instructions (Addendum)
Steps to Quit Smoking Smoking tobacco can be bad for your health. It can also affect almost every organ in your body. Smoking puts you and people around you at risk for many serious long-lasting (chronic) diseases. Quitting smoking is hard, but it is one of the best things that you can do for your health. It is never too late to quit. What are the benefits of quitting smoking? When you quit smoking, you lower your risk for getting serious diseases and conditions. They can include:  Lung cancer or lung disease.  Heart disease.  Stroke.  Heart attack.  Not being able to have children (infertility).  Weak bones (osteoporosis) and broken bones (fractures).  If you have coughing, wheezing, and shortness of breath, those symptoms may get better when you quit. You may also get sick less often. If you are pregnant, quitting smoking can help to lower your chances of having a baby of low birth weight. What can I do to help me quit smoking? Talk with your doctor about what can help you quit smoking. Some things you can do (strategies) include:  Quitting smoking totally, instead of slowly cutting back how much you smoke over a period of time.  Going to in-person counseling. You are more likely to quit if you go to many counseling sessions.  Using resources and support systems, such as: ? Online chats with a counselor. ? Phone quitlines. ? Printed self-help materials. ? Support groups or group counseling. ? Text messaging programs. ? Mobile phone apps or applications.  Taking medicines. Some of these medicines may have nicotine in them. If you are pregnant or breastfeeding, do not take any medicines to quit smoking unless your doctor says it is okay. Talk with your doctor about counseling or other things that can help you.  Talk with your doctor about using more than one strategy at the same time, such as taking medicines while you are also going to in-person counseling. This can help make  quitting easier. What things can I do to make it easier to quit? Quitting smoking might feel very hard at first, but there is a lot that you can do to make it easier. Take these steps:  Talk to your family and friends. Ask them to support and encourage you.  Call phone quitlines, reach out to support groups, or work with a counselor.  Ask people who smoke to not smoke around you.  Avoid places that make you want (trigger) to smoke, such as: ? Bars. ? Parties. ? Smoke-break areas at work.  Spend time with people who do not smoke.  Lower the stress in your life. Stress can make you want to smoke. Try these things to help your stress: ? Getting regular exercise. ? Deep-breathing exercises. ? Yoga. ? Meditating. ? Doing a body scan. To do this, close your eyes, focus on one area of your body at a time from head to toe, and notice which parts of your body are tense. Try to relax the muscles in those areas.  Download or buy apps on your mobile phone or tablet that can help you stick to your quit plan. There are many free apps, such as QuitGuide from the CDC (Centers for Disease Control and Prevention). You can find more support from smokefree.gov and other websites.  This information is not intended to replace advice given to you by your health care provider. Make sure you discuss any questions you have with your health care provider. Document Released: 08/15/2009 Document   Revised: 06/16/2016 Document Reviewed: 03/05/2015 Elsevier Interactive Patient Education  2018 Else[employer] Inc[employer]     Stroke Prevention Some health problems and behaviors may make it more likely for you to have a stroke. Below are ways to lessen your risk of having a stroke.  Be active for at least 30 minutes on most or all days.  Do not smoke. Try not to be around others who smoke.  Do not drink too much alcohol. ? Do not have more than 2 drinks a day if you are a man. ? Do not have more than 1 drink a day if you  are a woman and are not pregnant.  Eat healthy foods, such as fruits and vegetables. If you were put on a specific diet, follow the diet as told.  Keep your cholesterol levels under control through diet and medicines. Look for foods that are low in saturated fat, trans fat, cholesterol, and are high in fiber.  If you have diabetes, follow all diet plans and take your medicine as told.  Ask your doctor if you need treatment to lower your blood pressure. If you have high blood pressure (hypertension), follow all diet plans and take your medicine as told by your doctor.  If you are 18-39 years old, have your blood pressure checked every 3-5 years. If you are age 40 or older, have your blood pressure checked every year.  Keep a healthy weight. Eat foods that are low in calories, salt, saturated fat, trans fat, and cholesterol.  Do not take drugs.  Avoid birth control pills, if this applies. Talk to your doctor about the risks of taking birth control pills.  Talk to your doctor if you have sleep problems (sleep apnea).  Take all medicine as told by your doctor. ? You may be told to take aspirin or blood thinner medicine. Take this medicine as told by your doctor. ? Understand your medicine instructions.  Make sure any other conditions you have are being taken care of.  Get help right away if:  You suddenly lose feeling (you feel numb) or have weakness in your face, arm, or leg.  Your face or eyelid hangs down to one side.  You suddenly feel confused.  You have trouble talking (aphasia) or understanding what people are saying.  You suddenly have trouble seeing in one or both eyes.  You suddenly have trouble walking.  You are dizzy.  You lose your balance or your movements are clumsy (uncoordinated).  You suddenly have a very bad headache and you do not know the cause.  You have new chest pain.  Your heart feels like it is fluttering or skipping a beat (irregular  heartbeat). Do not wait to see if the symptoms above go away. Get help right away. Call your local emergency services (911 in [place] Do not drive yourself to the hospital. This information is not intended to replace advice given to you by your health care provider. Make sure you discuss any questions you have with your health care provider. Document Released: 04/19/2012 Document Revised: 03/26/2016 Document Reviewed: 04/21/2013 Elsevier Interactive Patient Education  2018 Elsevier Inc.  

## 2018-07-17 ENCOUNTER — Other Ambulatory Visit: Payer: Self-pay | Admitting: Internal Medicine

## 2018-07-28 ENCOUNTER — Other Ambulatory Visit: Payer: Self-pay | Admitting: Internal Medicine

## 2018-09-05 ENCOUNTER — Other Ambulatory Visit: Payer: Self-pay | Admitting: Internal Medicine

## 2018-10-04 NOTE — Progress Notes (Addendum)
Subjective:   Tammy Barron is a 77 y.o. female who presents for Medicare Annual (Subsequent) preventive examination.  Reports health as good Dr. 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  Lives in the same home Single story  Plans to stay there  Massive stroke x 77 yo    Diet Chol/hdl 3  Triglycerides 246   Exercise Walking prn  There are no preventive care reminders to display for this patient. Colonoscopy 08-Apr-2006 - no more per her report  dexa not a candidate due to the fact she declines  Mammogram 10/2010 - declines  Declines Tdap and will take if needed   Had flu vaccine in clinic today  Cardiac Risk Factors include: advanced age (>29men, >6 women);dyslipidemia;family history of premature cardiovascular disease     Objective:     Vitals: BP 130/64   Pulse 63   Ht 5\' 3"  (1.6 m)   Wt 178 lb (80.7 kg)   BMI 31.53 kg/m   Body mass index is 31.53 kg/m.  Advanced Directives 10/05/2018 06/10/2017 12/10/2016 04/09/2016 10/03/2015 04/16/2014 02/06/2014  Does Patient Have a Medical Advance Directive? Yes No Yes Yes Yes Patient would like information Patient has advance directive, copy not in chart  Type of Advance Directive - - Healthcare Power of [place];Living will Healthcare Power of [employer] Power of Attorney - Living will  Does patient want to make changes to medical advance directive? - - - No - Patient declined No - Patient declined - No change requested  Copy of Healthcare Power of Attorney in Chart? - - - No - copy requested No - copy requested - Copy requested from family  Would patient like information on creating a medical advance directive? - No - Patient declined - - - Advance directive packet given -  Pre-existing out of facility DNR order (yellow form or pink MOST form) - - - - - - No    Tobacco Social History   Tobacco Use  Smoking Status Current Some Day Smoker   . Packs/day: 0.50  . Years: 40.00  . Pack years: 20.00  . Types: Cigarettes  Smokeless Tobacco Never Used  Tobacco Comment   5-10 cigs a week     Ready to quit: Yes Counseling given: Yes Comment: 5-10 cigs a week   Clinical Intake:      Past Medical History:  Diagnosis Date  . Anemia   . Anxiety   . Atrial fibrillation (HCC)   . Depression   . Hx of abnormal Pap smear   . Hyperlipidemia   . Hypertension    Past Surgical History:  Procedure Laterality Date  . ABDOMINAL HYSTERECTOMY  1982   Family History  Problem Relation Age of Onset  . Heart disease Mother   . Heart attack Father    Social History   Socioeconomic History  . Marital status: Widowed    Spouse name: Not on file  . Number of children: Not on file  . Years of education: Not on file  . Highest education level: Not on file  Occupational History  . Not on file  Social Needs  . Financial resource strain: Not on file  . Food insecurity:    Worry: Not on file    Inability: Not on file  . Transportation needs:    Medical: Not on file    Non-medical: Not on file  Tobacco Use  . Smoking status: Current Some Day Smoker    Packs/day: 0.50    Years: 40.00    Pack years: 20.00    Types: Cigarettes  . Smokeless tobacco: Never Used  . Tobacco comment: 5-10 cigs a week  Substance and Sexual Activity  . Alcohol use: No    Alcohol/week: 0.0 standard drinks  . Drug use: Never  . Sexual activity: Not on file  Lifestyle  . Physical activity:    Days per week: Not on file    Minutes per session: Not on file  . Stress: Not on file  Relationships  . Social connections:    Talks on phone: Not on file    Gets together: Not on file    Attends religious service: Not on file    Active member of club or organization: Not on file    Attends meetings of clubs or organizations: Not on file    Relationship status: Not on file  Other Topics Concern  . Not on file  Social History Narrative  . Not on file      Outpatient Encounter Medications as of 10/05/2018  Medication Sig  . acetaminophen (TYLENOL) 500 MG tablet Take 500 mg by mouth every 6 (six) hours as needed.  Marland Kitchen. atorvastatin (LIPITOR) 10 MG tablet TAKE 1 TABLET BY MOUTH EVERY DAY AT 6 PM  . bisoprolol-hydrochlorothiazide (ZIAC) 10-6.25 MG tablet TAKE 1 TABLET BY MOUTH DAILY.  . clorazepate (TRANXENE) 3.75 MG tablet TAKE 1 TABLET BY MOUTH TWICE A DAY AS NEEDED  . dabigatran (PRADAXA) 150 MG CAPS capsule Take 150 mg by mouth 2 (two) times daily.  . diclofenac sodium (VOLTAREN) 1 % GEL Apply 2 g topically 4 (four) times daily.  . Multiple Vitamin (MULTIVITAMIN) tablet Take 1 tablet by mouth daily.  . polyethylene glycol (MIRALAX / GLYCOLAX) packet Take 17 g by mouth daily. For constipation   No facility-administered encounter medications on file as of 10/05/2018.     Activities of Daily Living In your present state of health, do you have any difficulty performing the following activities: 10/05/2018  Hearing? N  Vision? N  Difficulty concentrating or making decisions? N  Comment compare memories with friends   Walking or climbing stairs? N  Dressing or bathing? N  Doing errands, shopping? N  Preparing Food and eating ? N  Using the Toilet? N  In the past six months, have you accidently leaked urine? N  Do you have problems with loss of bowel control? N  Managing your Medications? N  Managing your Finances? N  Housekeeping or managing your Housekeeping? N  Some recent data might be hidden    Patient Care Team: Wynn BankerKoberlein, Junell C, MD as PCP - General (Family Medicine) Yates DecampGanji, Jay, MD as Consulting Physician (Cardiology)    Assessment:   This is a routine wellness examination [facility].  Exercise Activities and Dietary recommendations Current Exercise Habits: Home exercise routine, Type of exercise: walking, Intensity: Mild  Goals    . Patient Stated     To lives your life!        Fall Risk Fall Risk  10/05/2018  01/05/2017 10/18/2015  Falls in the past year? 0 No No     Depression Screen PHQ 2/9 Scores 10/05/2018 01/05/2017 10/18/2015  PHQ - 2 Score 0 0 0     Cognitive Function MMSE - Mini Mental State Exam 10/05/2018  Not completed: (No Data)   Ad8 score reviewed for issues:  Issues making  decisions:  Less interest in hobbies / activities:  Repeats questions, stories (family complaining):  Trouble using ordinary gadgets (microwave, computer, phone):  Forgets the month or year:   Mismanaging finances:   Remembering appts:  Daily problems with thinking and/or memory: Ad8 score is=0          Immunization History  Administered Date(s) Administered  . Influenza Whole 08/22/2009  . Influenza, High Dose Seasonal PF 10/18/2015, 10/05/2018  . Influenza,inj,Quad PF,6+ Mos 08/23/2014  . Influenza-Unspecified 09/02/2013, 10/05/2016  . Pneumococcal Conjugate-13 08/23/2014  . Pneumococcal Polysaccharide-23 08/06/2008, 10/08/2010    Screening Tests Health Maintenance  Topic Date Due  . TETANUS/TDAP  11/02/2019 (Originally 04/19/1960)  . INFLUENZA VACCINE  Completed  . PNA vac Low Risk Adult  Completed         Plan:      PCP Notes   Health Maintenance Colonoscopy 2007 - no more per her report  dexa not a candidate due to the fact she declines  Mammogram 10/2010 - declines  Declines Tdap and will take if needed   Had flu vaccine in clinic today    Abnormal Screens  None noted   Referrals  None  Patient concerns; None at this time   Nurse Concerns; As noted   Next PCP apt As noted       I have personally reviewed and noted the following in the patient's chart:   . Medical and social history . Use of alcohol, tobacco or illicit drugs  . Current medications and supplements . Functional ability and status . Nutritional status . Physical activity . Advanced directives . List of other physicians . Hospitalizations, surgeries, and ER visits in  previous 12 months . Vitals . Screenings to include cognitive, depression, and falls . Referrals and appointments  In addition, I have reviewed and discussed with patient certain preventive protocols, quality metrics, and best practice recommendations. A written personalized care plan for preventive services as well as general preventive health recommendations were provided to patient.     Alea Ryer, RN  10/05/2018  I have personally reviewed the Annual Wellness Visit documentation and agree with plan listed above.  Theodis Shove, MD

## 2018-10-05 ENCOUNTER — Ambulatory Visit (INDEPENDENT_AMBULATORY_CARE_PROVIDER_SITE_OTHER): Payer: Medicare Other

## 2018-10-05 ENCOUNTER — Encounter: Payer: Self-pay | Admitting: Family Medicine

## 2018-10-05 ENCOUNTER — Ambulatory Visit: Payer: Medicare Other | Admitting: Family Medicine

## 2018-10-05 VITALS — BP 130/64 | HR 63 | Ht 63.0 in | Wt 178.0 lb

## 2018-10-05 VITALS — BP 130/64 | HR 63 | Temp 98.1°F | Wt 178.0 lb

## 2018-10-05 DIAGNOSIS — I480 Paroxysmal atrial fibrillation: Secondary | ICD-10-CM | POA: Diagnosis not present

## 2018-10-05 DIAGNOSIS — I69319 Unspecified symptoms and signs involving cognitive functions following cerebral infarction: Secondary | ICD-10-CM | POA: Diagnosis not present

## 2018-10-05 DIAGNOSIS — L570 Actinic keratosis: Secondary | ICD-10-CM

## 2018-10-05 DIAGNOSIS — E785 Hyperlipidemia, unspecified: Secondary | ICD-10-CM

## 2018-10-05 DIAGNOSIS — I639 Cerebral infarction, unspecified: Secondary | ICD-10-CM

## 2018-10-05 DIAGNOSIS — I10 Essential (primary) hypertension: Secondary | ICD-10-CM

## 2018-10-05 DIAGNOSIS — Z23 Encounter for immunization: Secondary | ICD-10-CM | POA: Diagnosis not present

## 2018-10-05 DIAGNOSIS — Z0000 Encounter for general adult medical examination without abnormal findings: Secondary | ICD-10-CM

## 2018-10-05 LAB — COMPREHENSIVE METABOLIC PANEL
ALT: 14 U/L (ref 0–35)
AST: 18 U/L (ref 0–37)
Albumin: 4 g/dL (ref 3.5–5.2)
Alkaline Phosphatase: 48 U/L (ref 39–117)
BILIRUBIN TOTAL: 0.4 mg/dL (ref 0.2–1.2)
BUN: 17 mg/dL (ref 6–23)
CO2: 32 mEq/L (ref 19–32)
Calcium: 9.4 mg/dL (ref 8.4–10.5)
Chloride: 94 mEq/L — ABNORMAL LOW (ref 96–112)
Creatinine, Ser: 0.82 mg/dL (ref 0.40–1.20)
GFR: 71.39 mL/min (ref >60.00)
GLUCOSE: 93 mg/dL (ref 70–99)
Potassium: 4.3 mEq/L (ref 3.5–5.1)
Sodium: 132 mEq/L — ABNORMAL LOW (ref 135–145)
Total Protein: 6.8 g/dL (ref 6.0–8.3)

## 2018-10-05 LAB — CBC WITH DIFFERENTIAL/PLATELET
Basophils Absolute: 0 10*3/uL (ref 0.0–0.1)
Basophils Relative: 0.5 % (ref 0.0–3.0)
Eosinophils Absolute: 0.3 10*3/uL (ref 0.0–0.7)
Eosinophils Relative: 3.7 % (ref 0.0–5.0)
HCT: 45 % (ref 36.0–46.0)
Hemoglobin: 15.4 g/dL — ABNORMAL HIGH (ref 12.0–15.0)
LYMPHS ABS: 1.9 10*3/uL (ref 0.7–4.0)
Lymphocytes Relative: 23.6 % (ref 12.0–46.0)
MCHC: 34.2 g/dL (ref 30.0–36.0)
MCV: 91.4 fl (ref 78.0–100.0)
MONOS PCT: 7.7 % (ref 3.0–12.0)
Monocytes Absolute: 0.6 10*3/uL (ref 0.1–1.0)
Neutro Abs: 5.1 10*3/uL (ref 1.4–7.7)
Neutrophils Relative %: 64.5 % (ref 43.0–77.0)
Platelets: 170 10*3/uL (ref 150.0–400.0)
RBC: 4.92 Mil/uL (ref 3.87–5.11)
RDW: 13.5 % (ref 11.5–15.5)
WBC: 8 10*3/uL (ref 4.0–10.5)

## 2018-10-05 LAB — LIPID PANEL
Cholesterol: 143 mg/dL (ref 0–200)
HDL: 44.3 mg/dL (ref >39.00)
LDL Cholesterol: 59 mg/dL (ref 0–99)
NonHDL: 98.71
Total CHOL/HDL Ratio: 3
Triglycerides: 197 mg/dL — ABNORMAL HIGH (ref 0.0–149.0)
VLDL: 39.4 mg/dL (ref 0.0–40.0)

## 2018-10-05 LAB — TSH: TSH: 1.86 u[IU]/mL (ref 0.35–4.50)

## 2018-10-05 NOTE — Patient Instructions (Addendum)
Tammy Barron , Thank you for taking time to come for your Medicare Wellness Visit. I appreciate your ongoing commitment to your health goals. Please review the following plan we discussed and let me know if I can assist you in the future.   Shingrix is a vaccine for the prevention of Shingles in Adults 50 and older.  If you are on Medicare, the shingrix is covered under your Part D plan, so you will take both of the vaccines in the series at your pharmacy. Please check with your benefits regarding applicable copays or out of pocket expenses.  The Shingrix is given in 2 vaccines approx 8 weeks apart. You must receive the 2nd dose prior to 6 months from receipt of the first. Please have the pharmacist print out you Immunization  dates for our office records   A Tetanus is recommended every 10 years. Medicare covers a tetanus if you have a cut or wound; otherwise, there may be a charge. If you had not had a tetanus with pertusses, known as the Tdap, you can take this anytime.     These are the goals we discussed: Goals    . Patient Stated     To lives your life!        This is a list of the screening recommended for you and due dates:  Health Maintenance  Topic Date Due  . Tetanus Vaccine  04/19/1958  . DEXA scan (bone density measurement)  04/19/2004  . Flu Shot  06/02/2018  . Pneumonia vaccines  Completed      Fall Prevention in the Home Falls can cause injuries. They can happen to people of all ages. There are many things you can do to make your home safe and to help prevent falls. What can I do on the outside of my home?  Regularly fix the edges of walkways and driveways and fix any cracks.  Remove anything that might make you trip as you walk through a door, such as a raised step or threshold.  Trim any bushes or trees on the path to your home.  Use bright outdoor lighting.  Clear any walking paths of anything that might make someone trip, such as rocks or  tools.  Regularly check to see if handrails are loose or broken. Make sure that both sides of any steps have handrails.  Any raised decks and porches should have guardrails on the edges.  Have any leaves, snow, or ice cleared regularly.  Use sand or salt on walking paths during winter.  Clean up any spills in your garage right away. This includes oil or grease spills. What can I do in the bathroom?  Use night lights.  Install grab bars by the toilet and in the tub and shower. Do not use towel bars as grab bars.  Use non-skid mats or decals in the tub or shower.  If you need to sit down in the shower, use a plastic, non-slip stool.  Keep the floor dry. Clean up any water that spills on the floor as soon as it happens.  Remove soap buildup in the tub or shower regularly.  Attach bath mats securely with double-sided non-slip rug tape.  Do not have throw rugs and other things on the floor that can make you trip. What can I do in the bedroom?  Use night lights.  Make sure that you have a light by your bed that is easy to reach.  Do not use any sheets or blankets  that are too big for your bed. They should not hang down onto the floor.  Have a firm chair that has side arms. You can use this for support while you get dressed.  Do not have throw rugs and other things on the floor that can make you trip. What can I do in the kitchen?  Clean up any spills right away.  Avoid walking on wet floors.  Keep items that you use a lot in easy-to-reach places.  If you need to reach something above you, use a strong step stool that has a grab bar.  Keep electrical cords out of the way.  Do not use floor polish or wax that makes floors slippery. If you must use wax, use non-skid floor wax.  Do not have throw rugs and other things on the floor that can make you trip. What can I do with my stairs?  Do not leave any items on the stairs.  Make sure that there are handrails on both  sides of the stairs and use them. Fix handrails that are broken or loose. Make sure that handrails are as long as the stairways.  Check any carpeting to make sure that it is firmly attached to the stairs. Fix any carpet that is loose or worn.  Avoid having throw rugs at the top or bottom of the stairs. If you do have throw rugs, attach them to the floor with carpet tape.  Make sure that you have a light switch at the top of the stairs and the bottom of the stairs. If you do not have them, ask someone to add them for you. What else can I do to help prevent falls?  Wear shoes that: ? Do not have high heels. ? Have rubber bottoms. ? Are comfortable and fit you well. ? Are closed at the toe. Do not wear sandals.  If you use a stepladder: ? Make sure that it is fully opened. Do not climb a closed stepladder. ? Make sure that both sides of the stepladder are locked into place. ? Ask someone to hold it for you, if possible.  Clearly mark and make sure that you can see: ? Any grab bars or handrails. ? First and last steps. ? Where the edge of each step is.  Use tools that help you move around (mobility aids) if they are needed. These include: ? Canes. ? Walkers. ? Scooters. ? Crutches.  Turn on the lights when you go into a dark area. Replace any light bulbs as soon as they burn out.  Set up your furniture so you have a clear path. Avoid moving your furniture around.  If any of your floors are uneven, fix them.  If there are any pets around you, be aware of where they are.  Review your medicines with your doctor. Some medicines can make you feel dizzy. This can increase your chance of falling. Ask your doctor what other things that you can do to help prevent falls. This information is not intended to replace advice given to you by your health care provider. Make sure you discuss any questions you have with your health care provider. Document Released: 08/15/2009 Document Revised:  03/26/2016 Document Reviewed: 11/23/2014 Elsevier Interactive Patient Education  2018 [place] Maintenance, Female Adopting a healthy lifestyle and getting preventive care can go a long way to promote health and wellness. Talk with your health care provider about what schedule of regular examinations is right for you. This  is a good chance for you to check in with your provider about disease prevention and staying healthy. In between checkups, there are plenty of things you can do on your own. Experts have done a lot of research about which lifestyle changes and preventive measures are most likely to keep you healthy. Ask your health care provider for more information. Weight and diet Eat a healthy diet  Be sure to include plenty of vegetables, fruits, low-fat dairy products, and lean protein.  Do not eat a lot of foods high in solid fats, added sugars, or salt.  Get regular exercise. This is one of the most important things you can do for your health. ? Most adults should exercise for at least 150 minutes each week. The exercise should increase your heart rate and make you sweat (moderate-intensity exercise). ? Most adults should also do strengthening exercises at least twice a week. This is in addition to the moderate-intensity exercise.  Maintain a healthy weight  Body mass index (BMI) is a measurement that can be used to identify possible weight problems. It estimates body fat based on height and weight. Your health care provider can help determine your BMI and help you achieve or maintain a healthy weight.  For females 22 years of age and older: ? A BMI below 18.5 is considered underweight. ? A BMI of 18.5 to 24.9 is normal. ? A BMI of 25 to 29.9 is considered overweight. ? A BMI of 30 and above is considered obese.  Watch levels of cholesterol and blood lipids  You should start having your blood tested for lipids and cholesterol at 79 years of age, then have this test  every 5 years.  You may need to have your cholesterol levels checked more often if: ? Your lipid or cholesterol levels are high. ? You are older than 79 years of age. ? You are at high risk for heart disease.  Cancer screening Lung Cancer  Lung cancer screening is recommended for adults 101-73 years old who are at high risk for lung cancer because of a history of smoking.  A yearly low-dose CT scan of the lungs is recommended for people who: ? Currently smoke. ? Have quit within the past 15 years. ? Have at least a 30-pack-year history of smoking. A pack year is smoking an average of one pack of cigarettes a day for 1 year.  Yearly screening should continue until it has been 15 years since you quit.  Yearly screening should stop if you develop a health problem that would prevent you from having lung cancer treatment.  Breast Cancer  Practice breast self-awareness. This means understanding how your breasts normally appear and feel.  It also means doing regular breast self-exams. Let your health care provider know about any changes, no matter how small.  If you are in your 20s or 30s, you should have a clinical breast exam (CBE) by a health care provider every 1-3 years as part of a regular health exam.  If you are 41 or older, have a CBE every year. Also consider having a breast X-ray (mammogram) every year.  If you have a family history of breast cancer, talk to your health care provider about genetic screening.  If you are at high risk for breast cancer, talk to your health care provider about having an MRI and a mammogram every year.  Breast cancer gene (BRCA) assessment is recommended for women who have family members with BRCA-related cancers. BRCA-related cancers include: ?  Breast. ? Ovarian. ? Tubal. ? Peritoneal cancers.  Results of the assessment will determine the need for genetic counseling and BRCA1 and BRCA2 testing.  Cervical Cancer Your health care provider  may recommend that you be screened regularly for cancer of the pelvic organs (ovaries, uterus, and vagina). This screening involves a pelvic examination, including checking for microscopic changes to the surface of your cervix (Pap test). You may be encouraged to have this screening done every 3 years, beginning at age 96.  For women ages 48-65, health care providers may recommend pelvic exams and Pap testing every 3 years, or they may recommend the Pap and pelvic exam, combined with testing for human papilloma virus (HPV), every 5 years. Some types of HPV increase your risk of cervical cancer. Testing for HPV may also be done on women of any age with unclear Pap test results.  Other health care providers may not recommend any screening for nonpregnant women who are considered low risk for pelvic cancer and who do not have symptoms. Ask your health care provider if a screening pelvic exam is right for you.  If you have had past treatment for cervical cancer or a condition that could lead to cancer, you need Pap tests and screening for cancer for at least 20 years after your treatment. If Pap tests have been discontinued, your risk factors (such as having a new sexual partner) need to be reassessed to determine if screening should resume. Some women have medical problems that increase the chance of getting cervical cancer. In these cases, your health care provider may recommend more frequent screening and Pap tests.  Colorectal Cancer  This type of cancer can be detected and often prevented.  Routine colorectal cancer screening usually begins at 79 years of age and continues through 79 years of age.  Your health care provider may recommend screening at an earlier age if you have risk factors for colon cancer.  Your health care provider may also recommend using home test kits to check for hidden blood in the stool.  A small camera at the end of a tube can be used to examine your colon directly  (sigmoidoscopy or colonoscopy). This is done to check for the earliest forms of colorectal cancer.  Routine screening usually begins at age 55.  Direct examination of the colon should be repeated every 5-10 years through 79 years of age. However, you may need to be screened more often if early forms of precancerous polyps or small growths are found.  Skin Cancer  Check your skin from head to toe regularly.  Tell your health care provider about any new moles or changes in moles, especially if there is a change in a mole's shape or color.  Also tell your health care provider if you have a mole that is larger than the size of a pencil eraser.  Always use sunscreen. Apply sunscreen liberally and repeatedly throughout the day.  Protect yourself by wearing long sleeves, pants, a wide-brimmed hat, and sunglasses whenever you are outside.  Heart disease, diabetes, and high blood pressure  High blood pressure causes heart disease and increases the risk of stroke. High blood pressure is more likely to develop in: ? People who have blood pressure in the high end of the normal range (130-139/85-89 mm Hg). ? People who are overweight or obese. ? People who are African American.  If you are 38-18 years of age, have your blood pressure checked every 3-5 years. If you are 40  years of age or older, have your blood pressure checked every year. You should have your blood pressure measured twice-once when you are at a hospital or clinic, and once when you are not at a hospital or clinic. Record the average of the two measurements. To check your blood pressure when you are not at a hospital or clinic, you can use: ? An automated blood pressure machine at a pharmacy. ? A home blood pressure monitor.  If you are between 58 years and 27 years old, ask your health care provider if you should take aspirin to prevent strokes.  Have regular diabetes screenings. This involves taking a blood sample to check your  fasting blood sugar level. ? If you are at a normal weight and have a low risk for diabetes, have this test once every three years after 79 years of age. ? If you are overweight and have a high risk for diabetes, consider being tested at a younger age or more often. Preventing infection Hepatitis B  If you have a higher risk for hepatitis B, you should be screened for this virus. You are considered at high risk for hepatitis B if: ? You were born in a country where hepatitis B is common. Ask your health care provider which countries are considered high risk. ? Your parents were born in a high-risk country, and you have not been immunized against hepatitis B (hepatitis B vaccine). ? You have HIV or AIDS. ? You use needles to inject street drugs. ? You live with someone who has hepatitis B. ? You have had sex with someone who has hepatitis B. ? You get hemodialysis treatment. ? You take certain medicines for conditions, including cancer, organ transplantation, and autoimmune conditions.  Hepatitis C  Blood testing is recommended for: ? Everyone born from [birth year] through 1967. ? Anyone with known risk factors for hepatitis C.  Sexually transmitted infections (STIs)  You should be screened for sexually transmitted infections (STIs) including gonorrhea and chlamydia if: ? You are sexually active and are younger than 79 years of age. ? You are older than 79 years of age and your health care provider tells you that you are at risk for this type of infection. ? Your sexual activity has changed since you were last screened and you are at an increased risk for chlamydia or gonorrhea. Ask your health care provider if you are at risk.  If you do not have HIV, but are at risk, it may be recommended that you take a prescription medicine daily to prevent HIV infection. This is called pre-exposure prophylaxis (PrEP). You are considered at risk if: ? You are sexually active and do not regularly use condoms  or know the HIV status of your partner(s). ? You take drugs by injection. ? You are sexually active with a partner who has HIV.  Talk with your health care provider about whether you are at high risk of being infected with HIV. If you choose to begin PrEP, you should first be tested for HIV. You should then be tested every 3 months for as long as you are taking PrEP. Pregnancy  If you are premenopausal and you may become pregnant, ask your health care provider about preconception counseling.  If you may become pregnant, take 400 to 800 micrograms (mcg) of folic acid every day.  If you want to prevent pregnancy, talk to your health care provider about birth control (contraception). Osteoporosis and menopause  Osteoporosis is a disease in which  the bones lose minerals and strength with aging. This can result in serious bone fractures. Your risk for osteoporosis can be identified using a bone density scan.  If you are 50 years of age or older, or if you are at risk for osteoporosis and fractures, ask your health care provider if you should be screened.  Ask your health care provider whether you should take a calcium or vitamin D supplement to lower your risk for osteoporosis.  Menopause may have certain physical symptoms and risks.  Hormone replacement therapy may reduce some of these symptoms and risks. Talk to your health care provider about whether hormone replacement therapy is right for you. Follow these instructions at home:  Schedule regular health, dental, and eye exams.  Stay current with your immunizations.  Do not use any tobacco products including cigarettes, chewing tobacco, or electronic cigarettes.  If you are pregnant, do not drink alcohol.  If you are breastfeeding, limit how much and how often you drink alcohol.  Limit alcohol intake to no more than 1 drink per day for nonpregnant women. One drink equals 12 ounces of beer, 5 ounces of wine, or 1 ounces of hard  liquor.  Do not use street drugs.  Do not share needles.  Ask your health care provider for help if you need support or information about quitting drugs.  Tell your health care provider if you often feel depressed.  Tell your health care provider if you have ever been abused or do not feel safe at home. This information is not intended to replace advice given to you by your health care provider. Make sure you discuss any questions you have with your health care provider. Document Released: 05/04/2011 Document Revised: 03/26/2016 Document Reviewed: 07/23/2015 Elsevier Interactive Patient Education  [employer].

## 2018-10-05 NOTE — Progress Notes (Signed)
Tammy ClarksMargaret L Barron DOB: 09/08/1941 Encounter date: 10/05/2018  This is a 77 y.o. female who presents to establish care. Chief Complaint  Patient presents with  . Transitions Of Care    History of present illness: ZOX:WRUEHTN:does ok with the ziac - she does feel like she can feel when bp is elevating and occasionally will take an extra dose. Seems to work very well for her. bp varies 110-150's systolic.   Cerebella stroke with ataxia residual:  A fib: is taking pradaxa regularly. No problems with medication.   Hyperlipidemia: taking lipitor for this.  Anxiety: has some, but well controlled. Not something that bothers her.  Depression: not noted depressed mood.   DEXA: refuses. Just doesn't want to do it or be on treatment for bone density issues.  Has some joint issues; esp arthritis in left knee. Consistently slows her down. Has 3 pins in right ankle; thinks when she was in case and post op aggravated left inner knee. Has seen ortho and did have bone on bone arthritis. Worries about recovery from surgery. Hasn't tried injections in knee. Just harder to keep up. Has a hard time bending over since stroke (gets some vertigo). Walking is slow, bending is slow. Used to do silver sneakers. Stopped after stroke.   Also has some "barnacles" on arms and chest. Hasn't showed these with anyone in past.   Past Medical History:  Diagnosis Date  . Anemia   . Anxiety   . Atrial fibrillation (HCC)   . Depression   . Hx of abnormal Pap smear   . Hyperlipidemia   . Hypertension    Past Surgical History:  Procedure Laterality Date  . ABDOMINAL HYSTERECTOMY  1982   Allergies  Allergen Reactions  . Latex Rash   Current Meds  Medication Sig  . acetaminophen (TYLENOL) 500 MG tablet Take 500 mg by mouth every 6 (six) hours as needed.  Marland Kitchen. atorvastatin (LIPITOR) 10 MG tablet TAKE 1 TABLET BY MOUTH EVERY DAY AT 6 PM  . bisoprolol-hydrochlorothiazide (ZIAC) 10-6.25 MG tablet TAKE 1 TABLET BY MOUTH DAILY.   . clorazepate (TRANXENE) 3.75 MG tablet TAKE 1 TABLET BY MOUTH TWICE A DAY AS NEEDED  . dabigatran (PRADAXA) 150 MG CAPS capsule Take 150 mg by mouth 2 (two) times daily.  . diclofenac sodium (VOLTAREN) 1 % GEL Apply 2 g topically 4 (four) times daily.  . Multiple Vitamin (MULTIVITAMIN) tablet Take 1 tablet by mouth daily.  . polyethylene glycol (MIRALAX / GLYCOLAX) packet Take 17 g by mouth daily. For constipation   Social History   Tobacco Use  . Smoking status: Current Some Day Smoker    Packs/day: 0.50    Years: 40.00    Pack years: 20.00    Types: Cigarettes  . Smokeless tobacco: Never Used  . Tobacco comment: 5-10 cigs a week  Substance Use Topics  . Alcohol use: No    Alcohol/week: 0.0 standard drinks   Family History  Problem Relation Age of Onset  . Heart disease Mother   . Heart attack Father      Review of Systems  Constitutional: Negative for chills, fatigue and fever.  Respiratory: Negative for cough, chest tightness, shortness of breath and wheezing.   Cardiovascular: Negative for chest pain, palpitations and leg swelling.  Skin:       Has "barnacles" on skin. States that son is Armed forces operational officerdermatologist, but lives out of state. Would like to get these frozen. Has had freezing in past, but has been some time.  They do snag on clothing and sometimes irritate her.     Objective:  BP 130/64 (BP Location: Left Arm, Patient Position: Sitting, Cuff Size: Normal)   Pulse 63   Temp 98.1 F (36.7 C) (Oral)   Wt 178 lb (80.7 kg)   SpO2 97%   BMI 31.53 kg/m   Weight: 178 lb (80.7 kg)   BP Readings from Last 3 Encounters:  10/05/18 130/64  10/05/18 130/64  06/30/18 (!) 153/74   Wt Readings from Last 3 Encounters:  10/05/18 178 lb (80.7 kg)  10/05/18 178 lb (80.7 kg)  06/30/18 178 lb (80.7 kg)    Physical Exam  Constitutional: She is oriented to person, place, and time. She appears well-developed and well-nourished. No distress.  Cardiovascular: Normal rate, regular  rhythm and normal heart sounds. Exam reveals no friction rub.  No murmur heard. No lower extremity edema  Pulmonary/Chest: Effort normal and breath sounds normal. No respiratory distress. She has no wheezes. She has no rales.  Neurological: She is alert and oriented to person, place, and time.  Skin:  Scaled papules chest, back, hands. Largest 0.4cm chest.  Psychiatric: Her behavior is normal. Cognition and memory are normal.   After patient consent 3 cycles of cryotherapy applied to 6 scaled, white papules on chest, hands, back. Largest lesion on chest 0.4cm in size; others 3mm in size. Patient tolerated well.   Assessment/Plan: 1. Essential hypertension Continue current medication. - CBC with Differential/Platelet; Future - Comprehensive metabolic panel; Future - Comprehensive metabolic panel - CBC with Differential/Platelet  2. Cerebellar stroke (HCC) Does have some residual neurological symptoms especially with prolonged bending over.  Overall is very functional and independent and has learned to live with the symptoms.  3. Paroxysmal atrial fibrillation (HCC) On Pradaxa.  4. Dyslipidemia On Lipitor. - Lipid panel; Future - TSH; Future - TSH - Lipid panel  5. Encounter for immunization  - Flu vaccine HIGH DOSE PF 6. Actinic Keratosis: frozen in office today. Return if lesions not resolved in 2-3 weeks. Ok for derm referral if patient desires.   Return in about 6 months (around 04/06/2019) for Chronic condition visit.  Theodis Shove, MD

## 2018-10-06 ENCOUNTER — Encounter: Payer: Medicare Other | Admitting: Internal Medicine

## 2018-10-24 ENCOUNTER — Other Ambulatory Visit: Payer: Self-pay | Admitting: Family Medicine

## 2018-10-24 NOTE — Telephone Encounter (Signed)
Copied from CRM [id number]. Topic: Quick Communication - Rx Refill/Question >> Oct 24, 2018  9:33 AM Gean BirchwoodWilliams-Neal, Sade R wrote: Medication: clorazepate (TRANXENE) 3.75 MG tablet     Has the patient contacted their pharmacy? Yes Preferred Pharmacy (with phone number or street name): [id number]- [place], [place] - [address] [phone] (Phone) [phone] (Fax)    Agent: Please be advised that RX refills may take up to 3 business days. We ask that you follow-up with your pharmacy.

## 2018-10-24 NOTE — Telephone Encounter (Signed)
Last filled 07/29/18 By Dr.Todd Last OV with Dr. Hassan RowanKoberlein is 10/05/18, no TOC visit scheduled.  Ok to refill?

## 2018-10-24 NOTE — Telephone Encounter (Signed)
Requested medication (s) are due for refill today -yes  Requested medication (s) are on the active medication list -yes  Future visit scheduled -no  Last refill:  07/29/18  Notes to clinic: Patient is requesting a non-delegated Rx- sent for provider review   Requested Prescriptions  Pending Prescriptions Disp Refills   clorazepate (TRANXENE) 3.75 MG tablet 60 tablet 2    Sig: TAKE 1 TABLET BY MOUTH TWICE A DAY AS NEEDED     Not Delegated - Psychiatry:  Anxiolytics/Hypnotics Failed - 10/24/2018 10:34 AM      Failed - This refill cannot be delegated      Failed - Urine Drug Screen completed in last 360 days.      Passed - Valid encounter within last 6 months    Recent Outpatient Visits          2 weeks ago Essential hypertension   [facility] Paris LoreJunell C, MD   7 months ago Essential hypertension   [facility] Kw[facility], Janett LabellaPeter F, MD   1 year ago Essential hypertension   [facility] Kw[facility], Janett LabellaPeter F, MD   3 years ago Onychomycosis of toenail   [facility] Kw[facility], Janett LabellaPeter F, MD   4 years ago Essential hypertension   [facility] Kw[facility], Janett LabellaPeter F, MD              Requested Prescriptions  Pending Prescriptions Disp Refills   clorazepate (TRANXENE) 3.75 MG tablet 60 tablet 2    Sig: TAKE 1 TABLET BY MOUTH TWICE A DAY AS NEEDED     Not Delegated - Psychiatry:  Anxiolytics/Hypnotics Failed - 10/24/2018 10:34 AM      Failed - This refill cannot be delegated      Failed - Urine Drug Screen completed in last 360 days.      Passed - Valid encounter within last 6 months    Recent Outpatient Visits          2 weeks ago Essential hypertension   [facility], Paris LoreJunell C, MD   7 months ago Essential hypertension   [facility] Kw[facility], Janett LabellaPeter F, MD   1 year ago Essential hypertension   [facility] Kwiatkowski, Janett LabellaPeter F, MD   3 years ago Onychomycosis of toenail   [facility] Kwiat[facility], Janett LabellaPeter F, MD   4 years ago Essential hypertension   [facility] Kwiatkowski, Janett LabellaPeter F, MD

## 2018-10-25 MED ORDER — CLORAZEPATE DIPOTASSIUM 3.75 MG PO TABS: ORAL_TABLET | ORAL | 0 refills | Status: DC

## 2018-10-25 NOTE — Telephone Encounter (Signed)
I have sent in the refill for her, but please advise that this is a medication that is higher risk and I would prefer we try to work on decreasing use. I would recommend starting by taking just a 1/2 tab when needed but also if able to take just once daily I would recommend this. She is on a low dose but it is still considered a higher risk medication. Have her update me with progress before due for next refill. Don't stop medication suddenly.

## 2018-11-04 ENCOUNTER — Other Ambulatory Visit: Payer: Self-pay | Admitting: *Deleted

## 2018-11-04 DIAGNOSIS — I6521 Occlusion and stenosis of right carotid artery: Secondary | ICD-10-CM

## 2018-12-05 ENCOUNTER — Ambulatory Visit: Payer: Medicare Other | Admitting: Family

## 2018-12-05 ENCOUNTER — Encounter (HOSPITAL_COMMUNITY): Payer: Medicare Other

## 2018-12-20 ENCOUNTER — Other Ambulatory Visit: Payer: Self-pay | Admitting: Family Medicine

## 2018-12-20 NOTE — Telephone Encounter (Signed)
Requested medication (s) are due for refill today: Yes  Requested medication (s) are on the active medication list: Yes  Last refill:  Tranxene 10/25/18; Pradaxa 2015 by historical provider  Future visit scheduled: No  Notes to clinic:  Unable to refill per protocol     Requested Prescriptions  Pending Prescriptions Disp Refills   clorazepate (TRANXENE) 3.75 MG tablet 60 tablet 0    Sig: TAKE 1/2-1 TABLET BY MOUTH TWICE A DAY AS NEEDED for severe anxiety     Not Delegated - Psychiatry:  Anxiolytics/Hypnotics Failed - 12/20/2018  5:59 PM      Failed - This refill cannot be delegated      Failed - Urine Drug Screen completed in last 360 days.      Passed - Valid encounter within last 6 months    Recent Outpatient Visits          2 months ago Essential hypertension   Nature conservation officer at [employer], Paris Lore, MD   9 months ago Essential hypertension   Nature conservation officer at [employer], Janett Labella, MD   1 year ago Essential hypertension   Nature conservation officer at [employer], Janett Labella, MD   3 years ago Onychomycosis of toenail   [facility] [employer] [facility], Janett Labella, MD   4 years ago Essential hypertension   Nature conservation officer at [employer], Janett Labella, MD      Future Appointments            In 2 weeks Yates Decamp, MD [facility]          dabigatran (PRADAXA) 150 MG CAPS capsule 60 capsule     Sig: Take 1 capsule (150 mg total) by mouth 2 (two) times daily.     Hematology:  Anticoagulants Failed - 12/20/2018  5:59 PM      Failed - HGB in normal range and within 360 days    Hemoglobin  Date Value Ref Range Status  10/05/2018 15.4 (H) 12.0 - 15.0 g/dL Final         Passed - PLT in normal range and within 360 days    Platelets  Date Value Ref Range Status  10/05/2018 170.0 150.0 - 400.0 K/uL Final         Passed - HCT in normal range and within 360 days    HCT  Date Value Ref Range Status   10/05/2018 45.0 36.0 - 46.0 % Final         Passed - Cr in normal range and within 360 days    Creatinine, Ser  Date Value Ref Range Status  10/05/2018 0.82 0.40 - 1.20 mg/dL Final         Passed - Valid encounter within last 12 months    Recent Outpatient Visits          2 months ago Essential hypertension   Nature conservation officer at [employer], Paris Lore, MD   9 months ago Essential hypertension   Nature conservation officer at [employer], Janett Labella, MD   1 year ago Essential hypertension   Nature conservation officer at [employer], Janett Labella, MD   3 years ago Onychomycosis of toenail   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

## 2018-12-20 NOTE — Telephone Encounter (Signed)
Copied from CRM [id number]. Topic: Quick Communication - Rx Refill/Question >> Dec 20, 2018  5:56 PM Jilda Roche wrote: Medication: clorazepate (TRANXENE) 3.75 MG tablet, dabigatran (PRADAXA) 150 MG CAPS capsule,   Has the patient contacted their pharmacy? No. (Agent: If no, request that the patient contact the pharmacy for the refill.) (Agent: If yes, when and what did the pharmacy advise?)  Preferred Pharmacy (with phone number or street name): [id number] [place], [place] - [address] [phone] (Phone) [phone] (Fax)    Agent: Please be advised that RX refills may take up to 3 business days. We ask that you follow-up with your pharmacy.

## 2018-12-21 MED ORDER — CLORAZEPATE DIPOTASSIUM 3.75 MG PO TABS: ORAL_TABLET | ORAL | 0 refills | Status: DC

## 2018-12-21 MED ORDER — DABIGATRAN ETEXILATE MESYLATE 150 MG PO CAPS: 150.0000 mg | ORAL_CAPSULE | Freq: Two times a day (BID) | ORAL | 2 refills | Status: DC

## 2018-12-21 NOTE — Telephone Encounter (Signed)
Ok to fill 

## 2018-12-23 ENCOUNTER — Other Ambulatory Visit: Payer: Self-pay

## 2018-12-23 NOTE — Telephone Encounter (Signed)
Last filled 12/21/18 by Dr. Kirtland Bouchard Last OV 10/05/18  Ok to fill?

## 2019-01-06 ENCOUNTER — Other Ambulatory Visit: Payer: Self-pay | Admitting: Internal Medicine

## 2019-01-08 NOTE — Progress Notes (Signed)
Subjective:  Primary Physician:  Wynn Banker, MD  Patient ID: Tammy Barron, female    DOB: 1940-12-03, 78 y.o.   MRN: [id number]  Chief Complaint  Patient presents with  . Atrial Fibrillation  . Follow-up    HPI: Tammy Barron  is a 78 y.o. female  with atrial fibrillation and carotid stenosis (asymptomatic). She has h/o right brain CVA with occipital infarct and mild right eye visual defect. She was noted to have paroxysmal A. fib during hospitalization in 04/2014 for CVA. She was started on Pradaxa at that time. She is tolerating medication well and denies any bleeding diathesis or symptoms of recurrent A. fib or TIA since last visit.  She lives alone and states she is still able to care for herself but does not do much physical activity. I had seen her last on 06/14/17.  She presents here for 6 month office visit, states that she has been doing well and tolerating anticoagulation with Pradaxa well without bleeding diathesis.  She wishes not to change to Eliquis or Xarelto.  She has not had any further neurologic deficits.  Denies chest pain or shortness of breath.  Past Medical History:  Diagnosis Date  . Anemia   . Anxiety   . Atrial fibrillation (HCC)   . Carotid stenosis, right 01/09/2019  . Depression   . Hx of abnormal Pap smear   . Hyperlipidemia   . Hypertension   . Peripheral artery disease (HCC) 01/09/2019    Past Surgical History:  Procedure Laterality Date  . ABDOMINAL HYSTERECTOMY  1982  . ANKLE ARTHODESIS W/ ARTHROSCOPY      Social History   Socioeconomic History  . Marital status: Widowed    Spouse name: Not on file  . Number of children: 1  . Years of education: Not on file  . Highest education level: Not on file  Occupational History  . Not on file  Social Needs  . Financial resource strain: Not on file  . Food insecurity:    Worry: Not on file    Inability: Not on file  . Transportation needs:    Medical: Not on file   Non-medical: Not on file  Tobacco Use  . Smoking status: Former Smoker    Packs/day: 0.25    Years: 40.00    Pack years: 10.00    Types: Cigarettes    Last attempt to quit: 11/10/2018    Years since quitting: 0.1  . Smokeless tobacco: Never Used  . Tobacco comment: 5-10 cigs a week  Substance and Sexual Activity  . Alcohol use: No    Alcohol/week: 0.0 standard drinks  . Drug use: Never  . Sexual activity: Not on file  Lifestyle  . Physical activity:    Days per week: Not on file    Minutes per session: Not on file  . Stress: Not on file  Relationships  . Social connections:    Talks on phone: Not on file    Gets together: Not on file    Attends religious service: Not on file    Active member of club or organization: Not on file    Attends meetings of clubs or organizations: Not on file    Relationship status: Not on file  . Intimate partner violence:    Fear of current or ex partner: Not on file    Emotionally abused: Not on file    Physically abused: Not on file    Forced sexual activity: Not  on file  Other Topics Concern  . Not on file  Social History Narrative  . Not on file    Current Outpatient Medications on File Prior to Visit  Medication Sig Dispense Refill  . acetaminophen (TYLENOL) 500 MG tablet Take 500 mg by mouth every 6 (six) hours as needed.    Marland Kitchen atorvastatin (LIPITOR) 10 MG tablet TAKE 1 TABLET BY MOUTH EVERY DAY AT 6 PM 90 tablet 1  . bisoprolol-hydrochlorothiazide (ZIAC) 10-6.25 MG tablet TAKE 1 TABLET BY MOUTH DAILY. 90 tablet 1  . clorazepate (TRANXENE) 3.75 MG tablet TAKE 1/2-1 TABLET BY MOUTH TWICE A DAY AS NEEDED for severe anxiety 60 tablet 0  . dabigatran (PRADAXA) 150 MG CAPS capsule Take 1 capsule (150 mg total) by mouth 2 (two) times daily. 60 capsule 2  . Multiple Vitamin (MULTIVITAMIN) tablet Take 1 tablet by mouth daily.    . diclofenac sodium (VOLTAREN) 1 % GEL Apply 2 g topically 4 (four) times daily. (Patient not taking: Reported on  01/09/2019) 100 g 6  . polyethylene glycol (MIRALAX / GLYCOLAX) packet Take 17 g by mouth daily. For constipation (Patient not taking: Reported on 01/09/2019) 14 each 0   No current facility-administered medications on file prior to visit.     Review of Systems  Constitutional: Negative for malaise/fatigue and weight loss.  Eyes: Positive for blurred vision (cataract).  Respiratory: Negative for cough, hemoptysis and shortness of breath.   Cardiovascular: Negative for chest pain, palpitations, claudication and leg swelling.  Gastrointestinal: Negative for abdominal pain, blood in stool, constipation, heartburn and vomiting.  Genitourinary: Negative for dysuria.  Musculoskeletal: Negative for joint pain and myalgias.  Neurological: Negative for dizziness, focal weakness and headaches.  Endo/Heme/Allergies: Does not bruise/bleed easily.  Psychiatric/Behavioral: Negative for depression. The patient is not nervous/anxious.   All other systems reviewed and are negative.     Objective:  Blood pressure 130/72, pulse 66, height 5\' 4"  (1.626 m), weight 178 lb 8 oz (81 kg), SpO2 97 %. Body mass index is 30.64 kg/m.  Physical Exam  Constitutional: She appears well-developed and well-nourished. No distress.  HENT:  Head: Atraumatic.  Eyes: Conjunctivae are normal.  Neck: Neck supple. No JVD present. No thyromegaly present.  Cardiovascular: Normal rate, regular rhythm, normal heart sounds, intact distal pulses and normal pulses. Exam reveals no gallop.  No murmur heard. Pulmonary/Chest: Effort normal and breath sounds normal.  Abdominal: Soft. Bowel sounds are normal.  Musculoskeletal: Normal range of motion.        General: No edema.  Neurological: She is alert.  Skin: Skin is warm and dry.  Psychiatric: She has a normal mood and affect.    CARDIAC STUDIES:   Echocardiogram  TTE - 02/03/2014 -  EF 55% to 60% - wall motion was normal - grade 1 diastolic dysfunction - no evidence of embolic  source  Carotid artery duplex  06/30/2018: Right Carotid: Velocities in the right ICA are consistent with a 60-79% stenosis. Non-hemodynamically significant plaque <50% noted in  the CCA. The ECA appears <50% stenosed.  Left Carotid: Velocities in the left ICA are consistent with a 1-39% stenosis.   Assessment & Recommendations:   1. Paroxysmal atrial fibrillation (HCC) CHA2DS2-VASc Score is 7.  -(CHF; HTN; vasc disease DM,  Female = 1; Age <65 =0; 65-74 = 1,  >75 =2; stroke = 2).  -(Yearly risk of stroke: Score of 1=1.3; 2=2.2; 3=3.2; 4=4; 5=6.7; 6=9.8; 7=>9.8)  EKG 01/09/2019: Sinus rhythm with first-degree AV block at  the rate of 63 bpm, borderline criteria for left atrial enlargement, otherwise normal EKG. No significant change from  EKG 06/14/2017.  2. H/O ischemic vertebrobasilar artery cerebellar stroke 01/31/14  3. Essential hypertension CMP     Component Value Date/Time   NA 132 (L) 10/05/2018 1458   K 4.3 10/05/2018 1458   CL 94 (L) 10/05/2018 1458   CO2 32 10/05/2018 1458   GLUCOSE 93 10/05/2018 1458   BUN 17 10/05/2018 1458   CREATININE 0.82 10/05/2018 1458   CALCIUM 9.4 10/05/2018 1458   PROT 6.8 10/05/2018 1458   ALBUMIN 4.0 10/05/2018 1458   AST 18 10/05/2018 1458   ALT 14 10/05/2018 1458   ALKPHOS 48 10/05/2018 1458   BILITOT 0.4 10/05/2018 1458   GFRNONAA 84 (L) 02/09/2014 1220   GFRAA >90 02/09/2014 1220    4. Asymptomatic stenosis of right carotid artery  5. Hypercholesteremia  Lipid Panel     Component Value Date/Time   CHOL 143 10/05/2018 1458   TRIG 197.0 (H) 10/05/2018 1458   HDL 44.30 10/05/2018 1458   CHOLHDL 3 10/05/2018 1458   VLDL 39.4 10/05/2018 1458   LDLCALC 59 10/05/2018 1458   LDLDIRECT 38.0 01/05/2017 1057     6. Peripheral artery disease (HCC)  Recommendation:   Patient is presently doing well and has not had any recurrence of strokelike symptoms, she states that her right eye vision has also improved since last office  visit.  She would like to continue Pradaxa as she has not had any bruising, GI bleeding, and she is tolerating this well without any side effects.  I refilled the prescription.  With regard to carotid artery stenosis, she has missed appointment to have surveillance done at [facility],  she would like to continue to follow-up with me regarding this.  I have ordered carotid artery duplex.  Blood pressure is well controlled, she maintains sinus rhythm with first-degree AV block which is unchanged from 2017 and 2018.  Otherwise she seems to be doing well, I'll see her back in 6 months.  I reviewed her labs and updated them.  Renal function is normal.  With regard to hyperlipidemia, she likes to eat desserts fairly frequently, I have discussed elevated triglycerides with her.  Yates Decamp, MD, [facility] 01/09/2019, 2:55 PM [facility] Pager: [phone] Office: [phone] If no answer Cell [phone]

## 2019-01-09 ENCOUNTER — Encounter: Payer: Self-pay | Admitting: Cardiology

## 2019-01-09 ENCOUNTER — Ambulatory Visit: Payer: Medicare Other | Admitting: Cardiology

## 2019-01-09 VITALS — BP 130/72 | HR 66 | Ht 64.0 in | Wt 178.5 lb

## 2019-01-09 DIAGNOSIS — I480 Paroxysmal atrial fibrillation: Secondary | ICD-10-CM | POA: Diagnosis not present

## 2019-01-09 DIAGNOSIS — Z0189 Encounter for other specified special examinations: Secondary | ICD-10-CM

## 2019-01-09 DIAGNOSIS — Z8673 Personal history of transient ischemic attack (TIA), and cerebral infarction without residual deficits: Secondary | ICD-10-CM

## 2019-01-09 DIAGNOSIS — I739 Peripheral vascular disease, unspecified: Secondary | ICD-10-CM

## 2019-01-09 DIAGNOSIS — I10 Essential (primary) hypertension: Secondary | ICD-10-CM

## 2019-01-09 DIAGNOSIS — I6521 Occlusion and stenosis of right carotid artery: Secondary | ICD-10-CM | POA: Diagnosis not present

## 2019-01-09 DIAGNOSIS — E7800 Pure hypercholesterolemia, unspecified: Secondary | ICD-10-CM

## 2019-01-09 HISTORY — DX: Peripheral vascular disease, unspecified: I73.9

## 2019-01-09 HISTORY — DX: Occlusion and stenosis of right carotid artery: I65.21

## 2019-01-09 MED ORDER — DABIGATRAN ETEXILATE MESYLATE 150 MG PO CAPS: 150.0000 mg | ORAL_CAPSULE | Freq: Two times a day (BID) | ORAL | 3 refills | Status: DC

## 2019-02-10 ENCOUNTER — Telehealth: Payer: Self-pay | Admitting: Family Medicine

## 2019-02-13 ENCOUNTER — Other Ambulatory Visit: Payer: Self-pay | Admitting: Family Medicine

## 2019-02-13 NOTE — Telephone Encounter (Signed)
Requested medication (s) are due for refill today: yes  Requested medication (s) are on the active medication list: yes  Last refill:  Last refilled by Dr. Lesia Hausen on 07/18/18 #90 with 1 refill  Future visit scheduled: No  Notes to clinic:  Last refilled by Dr. Amador Cunas     Requested Prescriptions  Pending Prescriptions Disp Refills   atorvastatin (LIPITOR) 10 MG tablet 90 tablet 1    Sig: TAKE 1 TABLET BY MOUTH EVERY DAY AT 6 PM     Cardiovascular:  Antilipid - Statins Failed - 02/13/2019  6:00 PM      Failed - Triglycerides in normal range and within 360 days    Triglycerides  Date Value Ref Range Status  10/05/2018 197.0 (H) 0.0 - 149.0 mg/dL Final    Comment:    Normal:  <150 mg/dLBorderline High:  150 - 199 mg/dL         Passed - Total Cholesterol in normal range and within 360 days    Cholesterol  Date Value Ref Range Status  10/05/2018 143 0 - 200 mg/dL Final    Comment:    ATP III Classification       Desirable:  < 200 mg/dL               Borderline High:  200 - 239 mg/dL          High:  > = 681 mg/dL         Passed - LDL in normal range and within 360 days    LDL Cholesterol  Date Value Ref Range Status  10/05/2018 59 0 - 99 mg/dL Final         Passed - HDL in normal range and within 360 days    HDL  Date Value Ref Range Status  10/05/2018 44.30 >39.00 mg/dL Final         Passed - Patient is not pregnant      Passed - Valid encounter within last 12 months    Recent Outpatient Visits          4 months ago Essential hypertension   Nature conservation officer at [employer], Paris Lore, MD   11 months ago Essential hypertension   Nature conservation officer at [employer], Janett Labella, MD   2 years ago Essential hypertension   Nature conservation officer at [employer], Janett Labella, MD   3 years ago Onychomycosis of toenail   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

## 2019-02-14 NOTE — Telephone Encounter (Signed)
Med never filled by you. Ok to fill?

## 2019-02-15 MED ORDER — ATORVASTATIN CALCIUM 10 MG PO TABS: ORAL_TABLET | ORAL | 1 refills | Status: DC

## 2019-03-02 ENCOUNTER — Other Ambulatory Visit: Payer: Self-pay | Admitting: Family Medicine

## 2019-03-02 DIAGNOSIS — I10 Essential (primary) hypertension: Secondary | ICD-10-CM

## 2019-03-02 MED ORDER — BISOPROLOL-HYDROCHLOROTHIAZIDE 10-6.25 MG PO TABS: 1.0000 | ORAL_TABLET | Freq: Every day | ORAL | 1 refills | Status: DC

## 2019-03-02 NOTE — Telephone Encounter (Signed)
Copied from CRM [id number]. Topic: Quick Communication - Rx Refill/Question >> Mar 02, 2019  2:41 PM [place], [place] D wrote: Medication: bisoprolol-hydrochlorothiazide (ZIAC) 10-6.25 MG tablet / Pt stated she only has a few days left and contacted the pharmacy about a refill on 12/28/18.  Has the patient contacted their pharmacy? Yes.   (Agent: If no, request that the patient contact the pharmacy for the refill.) (Agent: If yes, when and what did the pharmacy advise?)  Preferred Pharmacy (with phone number or street name): [id number] [place] [address],  - [address] [phone] (Phone) [phone] (Fax)    Agent: Please be advised that RX refills may take up to 3 business days. We ask that you follow-up with your pharmacy.

## 2019-03-02 NOTE — Telephone Encounter (Signed)
[facility] [address]

## 2019-03-17 ENCOUNTER — Other Ambulatory Visit: Payer: Self-pay | Admitting: Family Medicine

## 2019-03-21 ENCOUNTER — Telehealth: Payer: Self-pay | Admitting: Family Medicine

## 2019-03-21 NOTE — Telephone Encounter (Signed)
Copied from CRM [id number]. Topic: Quick Communication - Rx Refill/Question >> Mar 21, 2019  2:25 PM Jaquita Rector A wrote: Medication: clorazepate (TRANXENE) 3.75 MG tablet   Patient have 2 pills left  Has the patient contacted their pharmacy? Yes.   (Agent: If no, request that the patient contact the pharmacy for the refill.) (Agent: If yes, when and what did the pharmacy advise?)  Preferred Pharmacy (with phone number or street name): [id number] [address], [place] - [address] [phone] (Phone) [phone] (Fax)    Agent: Please be advised that RX refills may take up to 3 business days. We ask that you follow-up with your pharmacy.

## 2019-03-22 ENCOUNTER — Other Ambulatory Visit: Payer: Self-pay | Admitting: Family Medicine

## 2019-03-22 MED ORDER — CLORAZEPATE DIPOTASSIUM 3.75 MG PO TABS: ORAL_TABLET | ORAL | 0 refills | Status: DC

## 2019-03-22 NOTE — Telephone Encounter (Signed)
Noted  

## 2019-03-22 NOTE — Telephone Encounter (Signed)
done

## 2019-05-08 ENCOUNTER — Telehealth: Payer: Self-pay | Admitting: *Deleted

## 2019-05-08 NOTE — Telephone Encounter (Signed)
Copied from [place] [id number]. Topic: General - Inquiry >> May 04, 2019  2:45 PM Reyne Dumas L wrote: Reason for CRM:   Pt calling to find out what she needs to do to get a living will set and a DNR order put in place for her.  Pt is willing to set up OV if that is what is needed, just wants to make sure that they are in her medical records for her.

## 2019-05-08 NOTE — Telephone Encounter (Signed)
Both forms were mailed to the pts home address.  I called the pt and informed her of the message below.

## 2019-05-08 NOTE — Telephone Encounter (Signed)
Please mail DNR/Living will information. I would also suggest she post in house and make sure this information and desires are communicated with her family. Once complete she can drop off/mail copy to [place] to scan in her chart.

## 2019-05-12 ENCOUNTER — Other Ambulatory Visit: Payer: Self-pay | Admitting: Family Medicine

## 2019-05-12 MED ORDER — CLORAZEPATE DIPOTASSIUM 3.75 MG PO TABS: ORAL_TABLET | ORAL | 0 refills | Status: DC

## 2019-06-26 ENCOUNTER — Other Ambulatory Visit: Payer: Medicare Other

## 2019-07-13 ENCOUNTER — Other Ambulatory Visit: Payer: Self-pay | Admitting: Family Medicine

## 2019-07-13 ENCOUNTER — Ambulatory Visit: Payer: Medicare Other | Admitting: Cardiology

## 2019-07-17 ENCOUNTER — Other Ambulatory Visit: Payer: Self-pay

## 2019-07-17 DIAGNOSIS — I639 Cerebral infarction, unspecified: Secondary | ICD-10-CM

## 2019-07-17 MED ORDER — DABIGATRAN ETEXILATE MESYLATE 150 MG PO CAPS: 150.0000 mg | ORAL_CAPSULE | Freq: Two times a day (BID) | ORAL | 3 refills | Status: DC

## 2019-07-31 ENCOUNTER — Other Ambulatory Visit: Payer: Self-pay

## 2019-07-31 ENCOUNTER — Encounter (HOSPITAL_COMMUNITY): Payer: Self-pay | Admitting: Emergency Medicine

## 2019-07-31 ENCOUNTER — Other Ambulatory Visit: Payer: Medicare Other

## 2019-07-31 ENCOUNTER — Emergency Department (HOSPITAL_COMMUNITY): Payer: Medicare Other

## 2019-07-31 ENCOUNTER — Inpatient Hospital Stay (HOSPITAL_COMMUNITY)
Admission: EM | Admit: 2019-07-31 | Discharge: 2019-08-04 | DRG: 536 | Disposition: A | Discharge status: Skilled Nursing Facility | Payer: Medicare Other | Attending: Internal Medicine | Admitting: Internal Medicine

## 2019-07-31 DIAGNOSIS — W19XXXA Unspecified fall, initial encounter: Secondary | ICD-10-CM | POA: Diagnosis present

## 2019-07-31 DIAGNOSIS — S32592A Other specified fracture of left pubis, initial encounter for closed fracture: Secondary | ICD-10-CM | POA: Diagnosis not present

## 2019-07-31 DIAGNOSIS — Z91040 Latex allergy status: Secondary | ICD-10-CM

## 2019-07-31 DIAGNOSIS — Z79899 Other long term (current) drug therapy: Secondary | ICD-10-CM

## 2019-07-31 DIAGNOSIS — E871 Hypo-osmolality and hyponatremia: Secondary | ICD-10-CM | POA: Diagnosis present

## 2019-07-31 DIAGNOSIS — F419 Anxiety disorder, unspecified: Secondary | ICD-10-CM | POA: Diagnosis present

## 2019-07-31 DIAGNOSIS — Y92480 Sidewalk as the place of occurrence of the external cause: Secondary | ICD-10-CM

## 2019-07-31 DIAGNOSIS — F17210 Nicotine dependence, cigarettes, uncomplicated: Secondary | ICD-10-CM | POA: Diagnosis present

## 2019-07-31 DIAGNOSIS — F329 Major depressive disorder, single episode, unspecified: Secondary | ICD-10-CM | POA: Diagnosis present

## 2019-07-31 DIAGNOSIS — N182 Chronic kidney disease, stage 2 (mild): Secondary | ICD-10-CM | POA: Diagnosis present

## 2019-07-31 DIAGNOSIS — Z8249 Family history of ischemic heart disease and other diseases of the circulatory system: Secondary | ICD-10-CM

## 2019-07-31 DIAGNOSIS — E876 Hypokalemia: Secondary | ICD-10-CM | POA: Diagnosis not present

## 2019-07-31 DIAGNOSIS — R0602 Shortness of breath: Secondary | ICD-10-CM

## 2019-07-31 DIAGNOSIS — E785 Hyperlipidemia, unspecified: Secondary | ICD-10-CM | POA: Diagnosis present

## 2019-07-31 DIAGNOSIS — I480 Paroxysmal atrial fibrillation: Secondary | ICD-10-CM | POA: Diagnosis present

## 2019-07-31 DIAGNOSIS — Z8673 Personal history of transient ischemic attack (TIA), and cerebral infarction without residual deficits: Secondary | ICD-10-CM

## 2019-07-31 DIAGNOSIS — D72829 Elevated white blood cell count, unspecified: Secondary | ICD-10-CM

## 2019-07-31 DIAGNOSIS — S32599A Other specified fracture of unspecified pubis, initial encounter for closed fracture: Secondary | ICD-10-CM | POA: Diagnosis present

## 2019-07-31 DIAGNOSIS — Z20828 Contact with and (suspected) exposure to other viral communicable diseases: Secondary | ICD-10-CM | POA: Diagnosis present

## 2019-07-31 DIAGNOSIS — Z7901 Long term (current) use of anticoagulants: Secondary | ICD-10-CM

## 2019-07-31 DIAGNOSIS — I129 Hypertensive chronic kidney disease with stage 1 through stage 4 chronic kidney disease, or unspecified chronic kidney disease: Secondary | ICD-10-CM | POA: Diagnosis present

## 2019-07-31 DIAGNOSIS — J410 Simple chronic bronchitis: Secondary | ICD-10-CM | POA: Diagnosis present

## 2019-07-31 DIAGNOSIS — W010XXA Fall on same level from slipping, tripping and stumbling without subsequent striking against object, initial encounter: Secondary | ICD-10-CM | POA: Diagnosis present

## 2019-07-31 DIAGNOSIS — I739 Peripheral vascular disease, unspecified: Secondary | ICD-10-CM | POA: Diagnosis present

## 2019-07-31 DIAGNOSIS — I10 Essential (primary) hypertension: Secondary | ICD-10-CM | POA: Diagnosis present

## 2019-07-31 DIAGNOSIS — M25552 Pain in left hip: Secondary | ICD-10-CM | POA: Diagnosis not present

## 2019-07-31 LAB — CBC WITH DIFFERENTIAL/PLATELET
Abs Immature Granulocytes: 0.06 10*3/uL (ref 0.00–0.07)
Basophils Absolute: 0 10*3/uL (ref 0.0–0.1)
Basophils Relative: 0 %
Eosinophils Absolute: 0 10*3/uL (ref 0.0–0.5)
Eosinophils Relative: 0 %
HCT: 43.7 % (ref 36.0–46.0)
Hemoglobin: 14.5 g/dL (ref 12.0–15.0)
Immature Granulocytes: 1 %
Lymphocytes Relative: 9 %
Lymphs Abs: 1 10*3/uL (ref 0.7–4.0)
MCH: 30.4 pg (ref 26.0–34.0)
MCHC: 33.2 g/dL (ref 30.0–36.0)
MCV: 91.6 fL (ref 80.0–100.0)
Monocytes Absolute: 0.7 10*3/uL (ref 0.1–1.0)
Monocytes Relative: 6 %
Neutro Abs: 9.2 10*3/uL — ABNORMAL HIGH (ref 1.7–7.7)
Neutrophils Relative %: 84 %
Platelets: 160 10*3/uL (ref 150–400)
RBC: 4.77 MIL/uL (ref 3.87–5.11)
RDW: 12.4 % (ref 11.5–15.5)
WBC: 11 10*3/uL — ABNORMAL HIGH (ref 4.0–10.5)
nRBC: 0 % (ref 0.0–0.2)

## 2019-07-31 LAB — TROPONIN I (HIGH SENSITIVITY): Troponin I (High Sensitivity): 5 ng/L (ref <18)

## 2019-07-31 LAB — BASIC METABOLIC PANEL
Anion gap: 10 (ref 5–15)
BUN: 21 mg/dL (ref 8–23)
CO2: 24 mmol/L (ref 22–32)
Calcium: 9.4 mg/dL (ref 8.9–10.3)
Chloride: 100 mmol/L (ref 98–111)
Creatinine, Ser: 1.07 mg/dL — ABNORMAL HIGH (ref 0.44–1.00)
GFR calc Af Amer: 57 mL/min — ABNORMAL LOW (ref >60)
GFR calc non Af Amer: 49 mL/min — ABNORMAL LOW (ref >60)
Glucose, Bld: 130 mg/dL — ABNORMAL HIGH (ref 70–99)
Potassium: 4.3 mmol/L (ref 3.5–5.1)
Sodium: 134 mmol/L — ABNORMAL LOW (ref 135–145)

## 2019-07-31 MED ORDER — HYDROCODONE-ACETAMINOPHEN 5-325 MG PO TABS
1.0000 | ORAL_TABLET | Freq: Once | ORAL | Status: AC
  Administered 2019-08-01: 1 via ORAL
  Filled 2019-07-31: qty 1

## 2019-07-31 NOTE — ED Provider Notes (Signed)
[facility]-EMERGENCY DEPT Provider Note   CSN: [id number] Arrival date & time: 07/31/19  2058     History   Chief Complaint Chief Complaint  Patient presents with  . Fall  . Hip Pain    HPI Tammy Barron is a 78 y.o. female.  Presents emergency room with chief complaint hip pain.  Patient states she tripped and fell landing on her left buttock.  Fall occurred around 1:00 today.  Was able to crawl back in her house and was able to sit in a chair.  States she did not hit her head, did not hit her back or neck.  Currently having only hip pain, no chest or abdominal pain.  States she is unable to bear weight due to the pain.  Currently 6 out of 10 in severity, sharp, stabbing, worsened with movement.  No alleviating factors.  Has not taken any medication for this.     HPI  Past Medical History:  Diagnosis Date  . Anemia   . Anxiety   . Atrial fibrillation (HCC)   . Carotid stenosis, right 01/09/2019  . Depression   . Hx of abnormal Pap smear   . Hyperlipidemia   . Hypertension   . Peripheral artery disease (HCC) 01/09/2019    Patient Active Problem List   Diagnosis Date Noted  . Carotid stenosis, right 01/09/2019  . Peripheral artery disease (HCC) 01/09/2019  . Laboratory examination 01/09/2019  . Ataxia, late effect of cerebrovascular disease 03/23/2014  . UTI (urinary tract infection) 02/14/2014  . Urination frequency 02/14/2014  . CVA (cerebral infarction) 02/06/2014  . Thrombocytopenia (HCC) 02/06/2014  . Atrial fibrillation (HCC) 02/05/2014  . Hypernatremia, induced 02/05/2014  . Obesity, unspecified 02/05/2014  . Tobacco use disorder 02/05/2014  . Obstructive hydrocephalus (HCC) 02/01/2014  . Cerebellar stroke (HCC) 01/31/2014  . ABDOMINAL PAIN 08/22/2009  . KNEE PAIN, LEFT, CHRONIC 02/21/2008  . Dyslipidemia 12/07/2007  . ANXIETY 12/07/2007  . DEPRESSION 12/07/2007  . Essential hypertension 12/07/2007    Past Surgical History:   Procedure Laterality Date  . ABDOMINAL HYSTERECTOMY  1982  . ANKLE ARTHODESIS W/ ARTHROSCOPY       OB History    Gravida  1   Para  1   Term      Preterm      AB      Living        SAB      TAB      Ectopic      Multiple      Live Births               Home Medications    Prior to Admission medications   Medication Sig Start Date End Date Taking? Authorizing Provider  acetaminophen (TYLENOL) 500 MG tablet Take 500 mg by mouth every 6 (six) hours as needed.    [provider]  atorvastatin (LIPITOR) 10 MG tablet TAKE 1 TABLET BY MOUTH EVERY DAY AT 6 PM 02/15/19   Koberlein, Paris Lore, MD  bisoprolol-hydrochlorothiazide (ZIAC) 10-6.25 MG tablet Take 1 tablet by mouth daily. 03/02/19   Koberlein, Paris Lore, MD  clorazepate (TRANXENE) 3.75 MG tablet TAKE 1/2-1 TABLET BY MOUTH TWICE A DAY AS NEEDED for severe anxiety 03/22/19   Koberlein, Jannette Spanner C, MD  clorazepate (TRANXENE) 3.75 MG tablet TAKE 1/2 TO 1 TABLET BY MOUTH TWICE A DAY AS NEEDED FOR SEVERE ANXIETY 07/14/19   Wynn Banker, MD  dabigatran (PRADAXA) 150 MG CAPS capsule Take  1 capsule (150 mg total) by mouth 2 (two) times daily. 07/17/19   Adrian Prows, MD  diclofenac sodium (VOLTAREN) 1 % GEL Apply 2 g topically 4 (four) times daily. Patient not taking: Reported on 01/09/2019 01/05/17   Marletta Lor, MD  Multiple Vitamin (MULTIVITAMIN) tablet Take 1 tablet by mouth daily.    [provider]  polyethylene glycol (MIRALAX / GLYCOLAX) packet Take 17 g by mouth daily. For constipation Patient not taking: Reported on 01/09/2019 02/16/14   Love, Ivan Anchors, PA-C    Family History Family History  Problem Relation Age of Onset  . Heart disease Mother   . Heart attack Father   . Cancer Sister   . Hypertension Maternal Aunt     Social History Social History   Tobacco Use  . Smoking status: Former Smoker    Packs/day: 0.25    Years: 40.00    Pack years: 10.00    Types: Cigarettes    Quit  date: 11/10/2018    Years since quitting: 0.7  . Smokeless tobacco: Never Used  . Tobacco comment: 5-10 cigs a week  Substance Use Topics  . Alcohol use: No    Alcohol/week: 0.0 standard drinks  . Drug use: Never     Allergies   Latex   Review of Systems Review of Systems  Constitutional: Negative for chills and fever.  HENT: Negative for ear pain and sore throat.   Eyes: Negative for pain and visual disturbance.  Respiratory: Negative for cough and shortness of breath.   Cardiovascular: Negative for chest pain and palpitations.  Gastrointestinal: Negative for abdominal pain and vomiting.  Genitourinary: Negative for dysuria and hematuria.  Musculoskeletal: Positive for arthralgias. Negative for back pain.  Skin: Negative for color change and rash.  Neurological: Negative for seizures and syncope.  All other systems reviewed and are negative.    Physical Exam Updated Vital Signs BP (!) 146/73   Pulse 76   Temp 98.5 F (36.9 C) (Oral)   Resp 18   Ht 5\' 3"  (1.6 m)   Wt 77.1 kg   SpO2 96%   BMI 30.11 kg/m   Physical Exam Vitals signs and nursing note reviewed.  Constitutional:      General: She is not in acute distress.    Appearance: She is well-developed.  HENT:     Head: Normocephalic and atraumatic.  Eyes:     Conjunctiva/sclera: Conjunctivae normal.  Neck:     Musculoskeletal: Neck supple.  Cardiovascular:     Rate and Rhythm: Normal rate and regular rhythm.     Heart sounds: No murmur.  Pulmonary:     Effort: Pulmonary effort is normal. No respiratory distress.     Breath sounds: Normal breath sounds.  Abdominal:     Palpations: Abdomen is soft.     Tenderness: There is no abdominal tenderness.  Musculoskeletal:     Comments: LLE: Tenderness palpation over left hip, no tenderness palpation throughout extremity, sensation intact, distal motor intact, DP and PT pulse intact RLE: no TTP throughout extremity, no deformity, distal pulses and motor intact  RUE:  no TTP throughout extremity, no deformity, distal pulses and motor intact LUE:  no TTP throughout extremity, no deformity, distal pulses and motor intact Back: no TTP over C, T, L spine  Skin:    General: Skin is warm and dry.  Neurological:     General: No focal deficit present.     Mental Status: She is alert and oriented to  person, place, and time.      ED Treatments / Results  Labs (all labs ordered are listed, but only abnormal results are displayed) Labs Reviewed  CBC WITH DIFFERENTIAL/PLATELET - Abnormal; Notable for the following components:      Result Value   WBC 11.0 (*)    Neutro Abs 9.2 (*)    All other components within normal limits  BASIC METABOLIC PANEL - Abnormal; Notable for the following components:   Sodium 134 (*)    Glucose, Bld 130 (*)    Creatinine, Ser 1.07 (*)    GFR calc non Af Amer 49 (*)    GFR calc Af Amer 57 (*)    All other components within normal limits  TROPONIN I (HIGH SENSITIVITY)  TROPONIN I (HIGH SENSITIVITY)    EKG None  Radiology Dg Chest 2 View  Result Date: 07/31/2019 CLINICAL DATA:  Post fall with left hip pain. EXAM: CHEST - 2 VIEW COMPARISON:  Radiograph 02/06/2014 FINDINGS: The cardiomediastinal contours are normal. Atherosclerosis of the aortic arch. Pulmonary vasculature is normal. No consolidation, pleural effusion, or pneumothorax. No acute osseous abnormalities are seen. IMPRESSION: 1. No acute abnormality. 2.  Aortic Atherosclerosis (ICD10-I70.0). Electronically Signed   By: Narda RutherfordMelanie  Sanford M.D.   On: 07/31/2019 22:10   Ct Hip Left Wo Contrast  Result Date: 07/31/2019 CLINICAL DATA:  78 year old female status post fall with left hip pain. EXAM: CT OF THE LEFT HIP WITHOUT CONTRAST TECHNIQUE: Multidetector CT imaging of the left hip was performed according to the standard protocol. Multiplanar CT image reconstructions were also generated. COMPARISON:  Left hip x-rays 2200 hours today. FINDINGS: Diverticulosis of the  sigmoid colon, normal diminutive left ovary, small pelvic phleboliths and iliac artery calcified atherosclerosis in the visible pelvis. No pelvic free fluid. Intact visible left sacral ala and SI joint. Oblique minimally displaced fracture of the left inferior pubic ramus on series 2, image 64. The pubic symphysis, left superior ramus, left acetabulum and iliac wing remain intact. The proximal left femur is intact. There is some subcutaneous soft tissue contusion/hematoma overlying the left greater trochanter on series 3, image 66. No other superficial soft tissue injury identified. Calcified plaque continues in the left femoral artery. No left inguinal lymphadenopathy. IMPRESSION: 1. Minimally displaced left inferior pubic ramus fracture. 2. No other acute fracture identified about the left hip. There is a mild soft tissue contusion/hematoma overlying the left greater trochanter. 3. Sigmoid diverticulosis. Electronically Signed   By: Odessa FlemingH  Hall M.D.   On: 07/31/2019 23:03   Dg Hip Unilat W Or Wo Pelvis 2-3 Views Left  Result Date: 07/31/2019 CLINICAL DATA:  Post fall today with left hip pain. EXAM: DG HIP (WITH OR WITHOUT PELVIS) 2-3V LEFT COMPARISON:  None. FINDINGS: The cortical margins of the bony pelvis and left hip are intact. No fracture. Pubic symphysis and sacroiliac joints are congruent. Both femoral heads are well-seated in the respective acetabula. IMPRESSION: No visualized fracture of the pelvis or left hip. Electronically Signed   By: Narda RutherfordMelanie  Sanford M.D.   On: 07/31/2019 22:09    Procedures Procedures (including critical care time)  Medications Ordered in ED Medications  HYDROcodone-acetaminophen (NORCO/VICODIN) 5-325 MG per tablet 1 tablet (has no administration in time range)     Initial Impression / Assessment and Plan / ED Course  I have reviewed the triage vital signs and the nursing notes.  Pertinent labs & imaging results that were available during my care of the patient were  reviewed by  me and considered in my medical decision making (see chart for details).  Clinical Course as of Jul 30 2334  Mon Jul 31, 2019  2335 Updated on results, lives alone without any assistance, will admit for observation admission, physical therapy and pain control   [RD]    Clinical Course User Index [RD] Milagros Loll, MD       78 year old lady past medical history of stroke, afib on Pradaxa presenting to ER after mechanical fall, left hip pain.  Patient otherwise well-appearing, no physical exam abnormalities except for left hip tenderness.  No head trauma. X-rays negative but had high clinical suspicion for fracture.  CT demonstrated mildly displaced inferior pubic ramus fracture.  Patient unable to ambulate without significant assistance, lives alone, believe she would benefit from observation admission for pain control, physical therapy consultation.  Will consult hospitalist for admission.  Final Clinical Impressions(s) / ED Diagnoses   Final diagnoses:  Closed fracture of ramus of left pubis, initial encounter (HCC)  Leukocytosis, unspecified type    ED Discharge Orders    None       Milagros Loll, MD 07/31/19 2340

## 2019-07-31 NOTE — ED Notes (Signed)
Pt transported to XR.  

## 2019-07-31 NOTE — ED Triage Notes (Signed)
Patient comes from home and fell on the sidewalk outside her home at 1300 today. She reports falling and landing on her left buttock. She was able to crawl back into her house and get into a chair she had been sitting in since 3 today. She complains of left hip especially with walking. She is also dizzy when walking. Patient had difficulty walking with her walker.    EMS vitals: 150/80 BP 90 HR 97% O2 sat on room air 176 CBG 18 RR

## 2019-08-01 ENCOUNTER — Observation Stay (HOSPITAL_COMMUNITY): Payer: Medicare Other

## 2019-08-01 DIAGNOSIS — W19XXXA Unspecified fall, initial encounter: Secondary | ICD-10-CM | POA: Diagnosis present

## 2019-08-01 DIAGNOSIS — Z7901 Long term (current) use of anticoagulants: Secondary | ICD-10-CM

## 2019-08-01 DIAGNOSIS — N182 Chronic kidney disease, stage 2 (mild): Secondary | ICD-10-CM | POA: Diagnosis present

## 2019-08-01 DIAGNOSIS — S32592A Other specified fracture of left pubis, initial encounter for closed fracture: Secondary | ICD-10-CM | POA: Diagnosis present

## 2019-08-01 DIAGNOSIS — E871 Hypo-osmolality and hyponatremia: Secondary | ICD-10-CM | POA: Diagnosis present

## 2019-08-01 DIAGNOSIS — Z8673 Personal history of transient ischemic attack (TIA), and cerebral infarction without residual deficits: Secondary | ICD-10-CM

## 2019-08-01 LAB — SARS CORONAVIRUS 2 (TAT 6-24 HRS): SARS Coronavirus 2: NEGATIVE

## 2019-08-01 LAB — TROPONIN I (HIGH SENSITIVITY)
Troponin I (High Sensitivity): 5 ng/L (ref <18)
Troponin I (High Sensitivity): 6 ng/L (ref <18)
Troponin I (High Sensitivity): 7 ng/L (ref <18)

## 2019-08-01 MED ORDER — CLORAZEPATE DIPOTASSIUM 3.75 MG PO TABS: 3.7500 mg | ORAL_TABLET | Freq: Two times a day (BID) | ORAL | Status: DC | PRN

## 2019-08-01 MED ORDER — IPRATROPIUM-ALBUTEROL 0.5-2.5 (3) MG/3ML IN SOLN: 3.0000 mL | Freq: Four times a day (QID) | RESPIRATORY_TRACT | Status: DC

## 2019-08-01 MED ORDER — METHOCARBAMOL 500 MG PO TABS: 500.0000 mg | ORAL_TABLET | Freq: Three times a day (TID) | ORAL | Status: DC | PRN

## 2019-08-01 MED ORDER — FUROSEMIDE 10 MG/ML IJ SOLN: 20.0000 mg | Freq: Once | INTRAMUSCULAR | Status: DC

## 2019-08-01 MED ORDER — ONDANSETRON HCL 4 MG PO TABS: 4.0000 mg | ORAL_TABLET | Freq: Four times a day (QID) | ORAL | Status: DC | PRN

## 2019-08-01 MED ORDER — HYDRALAZINE HCL 20 MG/ML IJ SOLN
10.0000 mg | INTRAMUSCULAR | Status: DC | PRN
  Administered 2019-08-01: 10 mg via INTRAVENOUS
  Filled 2019-08-01: qty 1

## 2019-08-01 MED ORDER — DABIGATRAN ETEXILATE MESYLATE 150 MG PO CAPS
150.0000 mg | ORAL_CAPSULE | Freq: Two times a day (BID) | ORAL | Status: DC
  Administered 2019-08-01 – 2019-08-04 (×6): 150 mg via ORAL
  Filled 2019-08-01 (×9): qty 1

## 2019-08-01 MED ORDER — OXYCODONE-ACETAMINOPHEN 5-325 MG PO TABS
1.0000 | ORAL_TABLET | ORAL | Status: DC | PRN
  Administered 2019-08-01: 1 via ORAL
  Filled 2019-08-01: qty 1

## 2019-08-01 MED ORDER — ACETAMINOPHEN 325 MG PO TABS: 650.0000 mg | ORAL_TABLET | Freq: Four times a day (QID) | ORAL | Status: DC | PRN

## 2019-08-01 MED ORDER — TRAZODONE HCL 50 MG PO TABS
50.0000 mg | ORAL_TABLET | Freq: Every evening | ORAL | Status: DC | PRN
  Administered 2019-08-01 – 2019-08-03 (×3): 50 mg via ORAL
  Filled 2019-08-01 (×3): qty 1

## 2019-08-01 MED ORDER — MORPHINE SULFATE (PF) 2 MG/ML IV SOLN
2.0000 mg | INTRAVENOUS | Status: DC | PRN
  Administered 2019-08-01 – 2019-08-02 (×2): 2 mg via INTRAVENOUS
  Filled 2019-08-01 (×2): qty 1

## 2019-08-01 MED ORDER — MORPHINE SULFATE (PF) 2 MG/ML IV SOLN: 0.5000 mg | INTRAVENOUS | Status: DC | PRN

## 2019-08-01 MED ORDER — ATORVASTATIN CALCIUM 10 MG PO TABS
10.0000 mg | ORAL_TABLET | Freq: Every day | ORAL | Status: DC
  Administered 2019-08-01 – 2019-08-03 (×3): 10 mg via ORAL
  Filled 2019-08-01 (×3): qty 1

## 2019-08-01 MED ORDER — IPRATROPIUM-ALBUTEROL 0.5-2.5 (3) MG/3ML IN SOLN
3.0000 mL | Freq: Four times a day (QID) | RESPIRATORY_TRACT | Status: DC
  Administered 2019-08-01: 3 mL via RESPIRATORY_TRACT
  Filled 2019-08-01 (×2): qty 3

## 2019-08-01 MED ORDER — HYDROCODONE-ACETAMINOPHEN 5-325 MG PO TABS: 1.0000 | ORAL_TABLET | ORAL | Status: DC | PRN

## 2019-08-01 MED ORDER — SENNOSIDES-DOCUSATE SODIUM 8.6-50 MG PO TABS
1.0000 | ORAL_TABLET | Freq: Two times a day (BID) | ORAL | Status: DC
  Administered 2019-08-01 – 2019-08-04 (×6): 1 via ORAL
  Filled 2019-08-01 (×8): qty 1

## 2019-08-01 MED ORDER — ONDANSETRON HCL 4 MG/2ML IJ SOLN: 4.0000 mg | Freq: Four times a day (QID) | INTRAMUSCULAR | Status: DC | PRN

## 2019-08-01 MED ORDER — ALBUTEROL SULFATE (2.5 MG/3ML) 0.083% IN NEBU: 2.5000 mg | INHALATION_SOLUTION | RESPIRATORY_TRACT | Status: DC | PRN

## 2019-08-01 MED ORDER — BISOPROLOL FUMARATE 5 MG PO TABS
10.0000 mg | ORAL_TABLET | Freq: Every day | ORAL | Status: DC
  Administered 2019-08-02 – 2019-08-04 (×3): 10 mg via ORAL
  Filled 2019-08-01: qty 1
  Filled 2019-08-01 (×3): qty 2

## 2019-08-01 MED ORDER — ACETAMINOPHEN 650 MG RE SUPP: 650.0000 mg | Freq: Four times a day (QID) | RECTAL | Status: DC | PRN

## 2019-08-01 NOTE — TOC Initial Note (Addendum)
Transition of Care [facility]) - Initial/Assessment Note    Patient Details  Name: Tammy Barron MRN: [id number] Date of Birth: 1941-10-29  Transition of Care [facility]) CM/SW Contact:    Montine Circle, LCSW Phone Number: 08/01/2019, 2:36 PM  Clinical Narrative:                 Patient is 78 yr old, Caucasian, female presenting to the ED for a fall which resulted in a left closed fracture. Patient has been seen by PT and recommendations are for SNF. Patient also is to be admitted to the medical floor and is currently on observation. CSW spoke with patient at bedside. Patient was alert and oriented. Patient reports she has never been to SNF. Patient asked questions about the process and if it will be covered by her insurance. CSW ensured patient that her insurance should cover between 21-30 days and if she will need more that will be handled by the facility she chooses to go to. Patient agreeable to have her information faxed out to SNFs. Patient reports she would like to stay in the [facility] area. Patient also requested CSW call her son Aneta Mins [phone]) to update him. CSW reached out to [place] and left a VM.    2:51p CSW spoke with patient's son, Dr. Kathlene November. CSW updated Dr. Emilee Hero on patient progress. Dr. Emilee Hero was agreeable to have patient's information faxed out for SNF. He reports although patient lives alone she has been very independent. He reports he lives in [place], [place]. Dr. Emilee Hero reports he would like to speak to a MD if possible to know exactly what is going on with his mom. CSW explained this could happen once patient gets to the medical floor. CSW explained that patient is still in the ED. Dr. Emilee Hero was thankful for what we are doing for his mother.   Expected Discharge Plan: Skilled Nursing Facility Barriers to Discharge: SNF Pending bed offer, Continued Medical Work up   Patient Goals and CMS Choice Patient states their goals for this hospitalization and ongoing  recovery are:: "feel better with my hip" CMS Medicare.gov Compare Post Acute Care list provided to:: Patient Choice offered to / list presented to : Patient  Expected Discharge Plan and Services Expected Discharge Plan: Skilled Nursing Facility In-house Referral: Clinical Social Work   Post Acute Care Choice: Skilled Nursing Facility Living arrangements for the past 2 months: Single Family Home Expected Discharge Date: ([place])                                    Prior Living Arrangements/Services Living arrangements for the past 2 months: Single Family Home Lives with:: Self Patient language and need for interpreter reviewed:: Yes Do you feel safe going back to the place where you live?: Yes      Need for Family Participation in Patient Care: Yes (Comment) Care giver support system in place?: No (comment)(patient lives alone)   Criminal Activity/Legal Involvement Pertinent to Current Situation/Hospitalization: No - Comment as needed  Activities of Daily Living Home Assistive Devices/Equipment: Contact lenses, Eyeglasses(lower partial plate) ADL Screening (condition at time of admission) Patient's cognitive ability adequate to safely complete daily activities?: Yes Is the patient deaf or have difficulty hearing?: No Does the patient have difficulty seeing, even when wearing glasses/contacts?: No Does the patient have difficulty concentrating, remembering, or making decisions?: No Patient able to express need for assistance with  ADLs?: Yes Does the patient have difficulty dressing or bathing?: Yes Independently performs ADLs?: No Communication: Independent Dressing (OT): Needs assistance Is this a change from baseline?: Change from baseline, expected to last >3 days Grooming: Needs assistance Is this a change from baseline?: Change from baseline, expected to last >3 days Feeding: Needs assistance Is this a change from baseline?: Change from baseline, expected to last >3  days Bathing: Needs assistance Is this a change from baseline?: Change from baseline, expected to last >3 days Toileting: Needs assistance Is this a change from baseline?: Change from baseline, expected to last >3days In/Out Bed: Needs assistance Is this a change from baseline?: Change from baseline, expected to last >3 days Walks in Home: Needs assistance Is this a change from baseline?: Change from baseline, expected to last >3 days Does the patient have difficulty walking or climbing stairs?: Yes Weakness of Legs: Both Weakness of Arms/Hands: None  Permission Sought/Granted Permission sought to share information with : Facility Sport and exercise psychologist, Family Supports Permission granted to share information with : Yes, Verbal Permission Granted  Share Information with NAME: Baldo Daub  Permission granted to share info w AGENCY: SNFs  Permission granted to share info w Relationship: Son  Permission granted to share info w Contact Information: [phone]  Emotional Assessment Appearance:: Appears stated age Attitude/Demeanor/Rapport: Engaged Affect (typically observed): Hopeful, Pleasant, Accepting Orientation: : Oriented to Self, Oriented to Place, Oriented to  Time, Oriented to Situation      Admission diagnosis:  Fall; Hip Pain Patient Active Problem List   Diagnosis Date Noted  . Fall 08/01/2019  . Closed fracture of pubic ramus, left, initial encounter ([place]) 08/01/2019  . H/O: CVA (cerebrovascular accident) 08/01/2019  . CKD (chronic kidney disease) stage 2, GFR 60-89 ml/min 08/01/2019  . mild Hyponatremia 08/01/2019  . Chronic anticoagulation 08/01/2019  . Carotid stenosis, right 01/09/2019  . Peripheral artery disease ([place]) 01/09/2019  . Laboratory examination 01/09/2019  . Ataxia, late effect of cerebrovascular disease 03/23/2014  . UTI (urinary tract infection) 02/14/2014  . Urination frequency 02/14/2014  . CVA (cerebral infarction) 02/06/2014  .  Thrombocytopenia ([place]) 02/06/2014  . AF (paroxysmal atrial fibrillation) ([place]) 02/05/2014  . Hypernatremia, induced 02/05/2014  . Obesity, unspecified 02/05/2014  . Tobacco use disorder 02/05/2014  . Obstructive hydrocephalus ([place]) 02/01/2014  . Cerebellar stroke ([place]) 01/31/2014  . ABDOMINAL PAIN 08/22/2009  . KNEE PAIN, LEFT, CHRONIC 02/21/2008  . Dyslipidemia 12/07/2007  . ANXIETY 12/07/2007  . DEPRESSION 12/07/2007  . Essential hypertension 12/07/2007   PCP:  Caren Macadam, MD Pharmacy:   [id number] [address] [place], [place] [address] Phone: [phone] Fax: [phone]  [facility] [place], [place] [address] [place] [address] Phone: [phone] Fax: [phone]     Social Determinants of Health (SDOH) Interventions    Readmission Risk Interventions No flowsheet data found.

## 2019-08-01 NOTE — Progress Notes (Signed)
Received report from Jeanella Craze RN, pt arrived to 1406 via bed. Call bell within reach.

## 2019-08-01 NOTE — Plan of Care (Signed)
POC initiated 

## 2019-08-01 NOTE — NC FL2 (Signed)
MEDICAID FL2 LEVEL OF CARE SCREENING TOOL     IDENTIFICATION  Patient Name: Tammy Barron Birthdate: December 17, 1940 Sex: female Admission Date (Current Location): 07/31/2019  [facility] and [place] Number:  Producer, television/film/video and Address:  [facility],  [address] [place], [place] [address]      Provider Number: [id number]  Attending Physician Name and Address:  Rolly Salter, MD  Relative Name and Phone Number:       Current Level of Care: Hospital Recommended Level of Care: Skilled Nursing Facility Prior Approval Number:    Date Approved/Denied:   PASRR Number: [id number]  Discharge Plan: SNF    Current Diagnoses: Patient Active Problem List   Diagnosis Date Noted  . Fall 08/01/2019  . Closed fracture of pubic ramus, left, initial encounter (HCC) 08/01/2019  . H/O: CVA (cerebrovascular accident) 08/01/2019  . CKD (chronic kidney disease) stage 2, GFR 60-89 ml/min 08/01/2019  . mild Hyponatremia 08/01/2019  . Chronic anticoagulation 08/01/2019  . Carotid stenosis, right 01/09/2019  . Peripheral artery disease (HCC) 01/09/2019  . Laboratory examination 01/09/2019  . Ataxia, late effect of cerebrovascular disease 03/23/2014  . UTI (urinary tract infection) 02/14/2014  . Urination frequency 02/14/2014  . CVA (cerebral infarction) 02/06/2014  . Thrombocytopenia (HCC) 02/06/2014  . AF (paroxysmal atrial fibrillation) (HCC) 02/05/2014  . Hypernatremia, induced 02/05/2014  . Obesity, unspecified 02/05/2014  . Tobacco use disorder 02/05/2014  . Obstructive hydrocephalus (HCC) 02/01/2014  . Cerebellar stroke (HCC) 01/31/2014  . ABDOMINAL PAIN 08/22/2009  . KNEE PAIN, LEFT, CHRONIC 02/21/2008  . Dyslipidemia 12/07/2007  . ANXIETY 12/07/2007  . DEPRESSION 12/07/2007  . Essential hypertension 12/07/2007    Orientation RESPIRATION BLADDER Height & Weight     Self, Time, Situation, Place  Normal Continent Weight: 170 lb (77.1 kg) Height:  5'  3" (160 cm)  BEHAVIORAL SYMPTOMS/MOOD NEUROLOGICAL BOWEL NUTRITION STATUS      Continent Diet(regular)  AMBULATORY STATUS COMMUNICATION OF NEEDS Skin   Extensive Assist Verbally Normal                       Personal Care Assistance Level of Assistance  Bathing, Dressing, Feeding Bathing Assistance: Maximum assistance Feeding assistance: Independent Dressing Assistance: Maximum assistance     Functional Limitations Info  Sight, Speech, Hearing Sight Info: Adequate Hearing Info: Adequate Speech Info: Adequate    SPECIAL CARE FACTORS FREQUENCY  PT (By licensed PT), OT (By licensed OT)     PT Frequency: 5x weekly OT Frequency: 5x weekly            Contractures Contractures Info: Not present    Additional Factors Info  Code Status, Allergies Code Status Info: Full Allergies Info: Latex           Current Medications (08/01/2019):  This is the current hospital active medication list Current Facility-Administered Medications  Medication Dose Route Frequency Provider Last Rate Last Dose  . acetaminophen (TYLENOL) tablet 650 mg  650 mg Oral Q6H PRN Rolly Salter, MD       Or  . acetaminophen (TYLENOL) suppository 650 mg  650 mg Rectal Q6H PRN Rolly Salter, MD      . atorvastatin (LIPITOR) tablet 10 mg  10 mg Oral q1800 Rolly Salter, MD      . bisoprolol (ZEBETA) tablet 10 mg  10 mg Oral Daily Rolly Salter, MD      . clorazepate (TRANXENE) tablet 3.75 mg  3.75 mg Oral  BID PRN Lavina Hamman, MD      . dabigatran (PRADAXA) capsule 150 mg  150 mg Oral BID Lavina Hamman, MD      . HYDROcodone-acetaminophen (NORCO/VICODIN) 5-325 MG per tablet 1 tablet  1 tablet Oral Q4H PRN Lavina Hamman, MD      . methocarbamol (ROBAXIN) tablet 500 mg  500 mg Oral Q8H PRN Ivor Costa, MD      . morphine 2 MG/ML injection 2-4 mg  2-4 mg Intravenous Q2H PRN Lavina Hamman, MD   2 mg at 08/01/19 1000  . ondansetron (ZOFRAN) tablet 4 mg  4 mg Oral Q6H PRN Lavina Hamman, MD        Or  . ondansetron [facility]) injection 4 mg  4 mg Intravenous Q6H PRN Lavina Hamman, MD      . senna-docusate (Senokot-S) tablet 1 tablet  1 tablet Oral BID Lavina Hamman, MD   1 tablet at 08/01/19 1001  . traZODone (DESYREL) tablet 50 mg  50 mg Oral QHS PRN Lavina Hamman, MD       Current Outpatient Medications  Medication Sig Dispense Refill  . acetaminophen (TYLENOL) 500 MG tablet Take 1,000 mg by mouth every 6 (six) hours as needed for mild pain, moderate pain or headache.     Marland Kitchen atorvastatin (LIPITOR) 10 MG tablet TAKE 1 TABLET BY MOUTH EVERY DAY AT 6 PM (Patient taking differently: Take 10 mg by mouth daily at 6 PM. ) 90 tablet 1  . bisoprolol-hydrochlorothiazide (ZIAC) 10-6.25 MG tablet Take 1 tablet by mouth daily. 90 tablet 1  . clorazepate (TRANXENE) 3.75 MG tablet TAKE 1/2 TO 1 TABLET BY MOUTH TWICE A DAY AS NEEDED FOR SEVERE ANXIETY (Patient taking differently: 3.75 mg 2 (two) times daily as needed for anxiety. ) 60 tablet 0  . dabigatran (PRADAXA) 150 MG CAPS capsule Take 1 capsule (150 mg total) by mouth 2 (two) times daily. 180 capsule 3  . Multiple Vitamin (MULTIVITAMIN) tablet Take 1 tablet by mouth daily.    . diclofenac sodium (VOLTAREN) 1 % GEL Apply 2 g topically 4 (four) times daily. (Patient not taking: Reported on 01/09/2019) 100 g 6  . polyethylene glycol (MIRALAX / GLYCOLAX) packet Take 17 g by mouth daily. For constipation (Patient not taking: Reported on 01/09/2019) 14 each 0     Discharge Medications: Please see discharge summary for a list of discharge medications.  Relevant Imaging Results:  Relevant Lab Results:   Additional Information SS# [id number]  Janace Hoard, LCSW

## 2019-08-01 NOTE — H&P (Signed)
[facility] History and Physical   Patient: Tammy Barron RUE:[id number]   PCP: Wynn BankerKoberlein, Junell C, MD DOB: January 31, 1941   DOA: 07/31/2019   DOS: 08/01/2019   DOS: the patient was seen and examined on 08/01/2019  Patient coming from: The patient is coming from Home  Chief Complaint: Mechanical fall  HPI: Tammy ClarksMargaret L Barron is a 78 y.o. female with Past medical history of A. fib, PAD, HTN, HLD, CVA, anxiety. Patient was brought to the hospital after a mechanical fall at home.  While walking outside the house she fell on the sidewalk after tripping over something.  She crawled back into the house and get into the chair has been sitting there.  Fall happened on 07/31/2019 1 PM, patient's RN chair for 6 hours before she realized that she is unable to stand up on her own and was having some dizziness and severe pain in her left hip and therefore called 911. Patient fell on her buttocks.  Denies any head injury or neck injury.  No focal deficit at the time of my evaluation.  No chest pain or shortness of breath.  No palpitation.  No nausea no vomiting no diarrhea at home.  No burning urination.  No recent change in medications.  She mentions she is compliant with all her medications.  ED Course: Presents with a fall and left hip pain.  X-ray hip was showing no evidence of acute fracture.  CT hip was performed due to patient's persistent complaint of pain and inability to stand up on her own which was positive for pubic rami fracture.  Patient was referred for admission for pain control and immobility.  At her baseline ambulates with cane and walker, lives alone Is independent for most of her ADL;  Does manages her medication on her own.  Review of Systems: as mentioned in the history of present illness.  All other systems reviewed and are negative.  Past Medical History:  Diagnosis Date  . Anemia   . Anxiety   . Atrial fibrillation (HCC)   . Carotid stenosis, right 01/09/2019  . Depression    . Hx of abnormal Pap smear   . Hyperlipidemia   . Hypertension   . Peripheral artery disease (HCC) 01/09/2019   Past Surgical History:  Procedure Laterality Date  . ABDOMINAL HYSTERECTOMY  1982  . ANKLE ARTHODESIS W/ ARTHROSCOPY     Social History:  reports that she quit smoking about 8 months ago. Her smoking use included cigarettes. She has a 10.00 pack-year smoking history. She has never used smokeless tobacco. She reports that she does not drink alcohol or use drugs.  Allergies  Allergen Reactions  . Latex Rash   Family history reviewed and not pertinent Family History  Problem Relation Age of Onset  . Heart disease Mother   . Heart attack Father   . Cancer Sister   . Hypertension Maternal Aunt      Prior to Admission medications   Medication Sig Start Date End Date Taking? Authorizing Provider  acetaminophen (TYLENOL) 500 MG tablet Take 1,000 mg by mouth every 6 (six) hours as needed for mild pain, moderate pain or headache.    Yes [provider]  atorvastatin (LIPITOR) 10 MG tablet TAKE 1 TABLET BY MOUTH EVERY DAY AT 6 PM Patient taking differently: Take 10 mg by mouth daily at 6 PM.  02/15/19  Yes Koberlein, Junell C, MD  bisoprolol-hydrochlorothiazide (ZIAC) 10-6.25 MG tablet Take 1 tablet by mouth daily. 03/02/19  Yes  Koberlein, Paris Lore, MD  clorazepate (TRANXENE) 3.75 MG tablet TAKE 1/2 TO 1 TABLET BY MOUTH TWICE A DAY AS NEEDED FOR SEVERE ANXIETY Patient taking differently: 3.75 mg 2 (two) times daily as needed for anxiety.  07/14/19  Yes Koberlein, Paris Lore, MD  dabigatran (PRADAXA) 150 MG CAPS capsule Take 1 capsule (150 mg total) by mouth 2 (two) times daily. 07/17/19  Yes Yates Decamp, MD  Multiple Vitamin (MULTIVITAMIN) tablet Take 1 tablet by mouth daily.   Yes [provider]  diclofenac sodium (VOLTAREN) 1 % GEL Apply 2 g topically 4 (four) times daily. Patient not taking: Reported on 01/09/2019 01/05/17   Gordy Savers, MD  polyethylene  glycol [facility]) packet Take 17 g by mouth daily. For constipation Patient not taking: Reported on 01/09/2019 02/16/14   Jerene Pitch    Physical Exam: Vitals:   07/31/19 2144 07/31/19 2203 08/01/19 0614 08/01/19 0737  BP: (!) 146/73  (!) 133/54 137/81  Pulse: 76  85 96  Resp:   18 17  Temp:      TempSrc:      SpO2: 96%  91% 99%  Weight:  77.1 kg    Height:   (1.6 m)      General: alert and oriented to time, place, and person. Appear in mild distress, affect appropriate Eyes: PERRL, Conjunctiva normal ENT: Oral Mucosa Clear, moist  Neck: no JVD, no Abnormal Mass Or lumps Cardiovascular: S1 and S2 Present, no Murmur, peripheral pulses symmetrical Respiratory: good respiratory effort, Bilateral Air entry equal and Decreased, no signs of accessory muscle use, Clear to Auscultation, no Crackles, no wheezes Abdomen: Bowel Sound present, Soft and no tenderness, no hernia Skin: no rashes  Extremities: no Pedal edema, no calf tenderness Neurologic: without any new focal findings Gait not checked due to patient safety concerns  Data Reviewed: I have personally reviewed and interpreted labs, imaging as discussed below.  CBC: Recent Labs  Lab 07/31/19 2212  WBC 11.0*  NEUTROABS 9.2*  HGB 14.5  HCT 43.7  MCV 91.6  PLT 160   Basic Metabolic Panel: Recent Labs  Lab 07/31/19 2212  NA 134*  K 4.3  CL 100  CO2 24  GLUCOSE 130*  BUN 21  CREATININE 1.07*  CALCIUM 9.4   GFR: Estimated Creatinine Clearance: 41.2 mL/min (A) (by C-G formula based on SCr of 1.07 mg/dL (H)). Liver Function Tests: No results for input(s): AST, ALT, ALKPHOS, BILITOT, PROT, ALBUMIN in the last 168 hours. No results for input(s): LIPASE, AMYLASE in the last 168 hours. No results for input(s): AMMONIA in the last 168 hours. Coagulation Profile: No results for input(s): INR, PROTIME in the last 168 hours. Cardiac Enzymes: No results for input(s): CKTOTAL, CKMB, CKMBINDEX,  TROPONINI in the last 168 hours. BNP (last 3 results) No results for input(s): PROBNP in the last 8760 hours. HbA1C: No results for input(s): HGBA1C in the last 72 hours. CBG: No results for input(s): GLUCAP in the last 168 hours. Lipid Profile: No results for input(s): CHOL, HDL, LDLCALC, TRIG, CHOLHDL, LDLDIRECT in the last 72 hours. Thyroid Function Tests: No results for input(s): TSH, T4TOTAL, FREET4, T3FREE, THYROIDAB in the last 72 hours. Anemia Panel: No results for input(s): VITAMINB12, FOLATE, FERRITIN, TIBC, IRON, RETICCTPCT in the last 72 hours. Urine analysis:    Component Value Date/Time   COLORURINE YELLOW 02/08/2014 2040   APPEARANCEUR CLEAR 02/08/2014 2040   LABSPEC 1.011 02/08/2014 2040   PHURINE 7.0 02/08/2014 2040  GLUCOSEU NEGATIVE 02/08/2014 2040   HGBUR NEGATIVE 02/08/2014 2040   HGBUR trace-intact 11/10/2007 0929   BILIRUBINUR NEGATIVE 02/08/2014 2040   KETONESUR NEGATIVE 02/08/2014 2040   PROTEINUR NEGATIVE 02/08/2014 2040   UROBILINOGEN 1.0 02/08/2014 2040   NITRITE NEGATIVE 02/08/2014 2040   LEUKOCYTESUR NEGATIVE 02/08/2014 2040    Radiological Exams on Admission: Dg Chest 2 View  Result Date: 07/31/2019 CLINICAL DATA:  Post fall with left hip pain. EXAM: CHEST - 2 VIEW COMPARISON:  Radiograph 02/06/2014 FINDINGS: The cardiomediastinal contours are normal. Atherosclerosis of the aortic arch. Pulmonary vasculature is normal. No consolidation, pleural effusion, or pneumothorax. No acute osseous abnormalities are seen. IMPRESSION: 1. No acute abnormality. 2.  Aortic Atherosclerosis (ICD10-I70.0). Electronically Signed   By: Keith Rake M.D.   On: 07/31/2019 22:10   Ct Hip Left Wo Contrast  Result Date: 07/31/2019 CLINICAL DATA:  78 year old female status post fall with left hip pain. EXAM: CT OF THE LEFT HIP WITHOUT CONTRAST TECHNIQUE: Multidetector CT imaging of the left hip was performed according to the standard protocol. Multiplanar CT image  reconstructions were also generated. COMPARISON:  Left hip x-rays 2200 hours today. FINDINGS: Diverticulosis of the sigmoid colon, normal diminutive left ovary, small pelvic phleboliths and iliac artery calcified atherosclerosis in the visible pelvis. No pelvic free fluid. Intact visible left sacral ala and SI joint. Oblique minimally displaced fracture of the left inferior pubic ramus on series 2, image 64. The pubic symphysis, left superior ramus, left acetabulum and iliac wing remain intact. The proximal left femur is intact. There is some subcutaneous soft tissue contusion/hematoma overlying the left greater trochanter on series 3, image 66. No other superficial soft tissue injury identified. Calcified plaque continues in the left femoral artery. No left inguinal lymphadenopathy. IMPRESSION: 1. Minimally displaced left inferior pubic ramus fracture. 2. No other acute fracture identified about the left hip. There is a mild soft tissue contusion/hematoma overlying the left greater trochanter. 3. Sigmoid diverticulosis. Electronically Signed   By: Genevie Ann M.D.   On: 07/31/2019 23:03   Dg Hip Unilat W Or Wo Pelvis 2-3 Views Left  Result Date: 07/31/2019 CLINICAL DATA:  Post fall today with left hip pain. EXAM: DG HIP (WITH OR WITHOUT PELVIS) 2-3V LEFT COMPARISON:  None. FINDINGS: The cortical margins of the bony pelvis and left hip are intact. No fracture. Pubic symphysis and sacroiliac joints are congruent. Both femoral heads are well-seated in the respective acetabula. IMPRESSION: No visualized fracture of the pelvis or left hip. Electronically Signed   By: Keith Rake M.D.   On: 07/31/2019 22:09   EKG: Independently reviewed. normal sinus rhythm, nonspecific ST and T waves changes, chronic J-point elevation, no ST elevation unchanged from prior EKG in March 2020.  I reviewed all nursing notes, pharmacy notes, vitals, pertinent old records.  Assessment/Plan 1. Closed fracture of pubic ramus, left,  initial encounter [facility]) Patient presents with complaints of a mechanical fall. CT scan of the hip shows minimally displaced left inferior pubic rami fracture.  No other acute fracture. Mild soft tissue contusion of the left greater trochanter. Patient was referred for admission for pain control and immobility. Patient lives alone and is on anticoagulation and remains at risk for further fall due to severe pain. I personally discussed with Dr. Lyla Glassing from orthopedics who recommended patient to follow-up in the clinic 2 weeks after the discharge. Continue pain control with IV morphine and oral Norco.  2.  Essential hypertension Blood pressure relatively controlled. Continue home medication  other than the diuretic.  3.  History of CVA. Paroxysmal A. fib. PAD. Chronic anticoagulation. Patient is on Pradaxa at home. Currently denies having any head injury or neck injury or focal deficit. We will continue Pradaxa for now. EKG shows sinus rhythm. Continue rate control medications.  4.  Chronic kidney disease stage II. Renal function at baseline shows GFR of around 70. Currently her GFR has decreased to 50 with elevated serum creatinine. Likely secondary to patient's prolonged fall and poor p.o. intake. Continue to monitor. Avoiding diuretics for now.  5.  Mild hyponatremia. Secondary to poor p.o. intake. Monitor. Holding diuretic.  6.  Mechanical fall. Patient presents with a mechanical fall.  Currently unable to ambulate due to severe pain. Patient is on Pradaxa and at risk for frequent falls secondary to her acute fracture. Currently requires physical therapy and unsafe to go home on her own given the new indication for pain medication. Social worker consulted for SNF per physical therapy recommendation.  Addendum: In the evening received a page from the nurse that the patient's blood pressure is elevated and patient also has expiratory wheezing. On further evaluation patient  reports expiratory wheezing but no chest pain or chest tightness. Blood pressure elevated and patient did not receive her antihypertensive medication that were initially ordered although patient reports that she has taken her blood pressure medications at home. Currently we will perform EKG chest x-ray provide IV hydralazine. We will transfer her to telemetry due to elevated blood pressure. DuoNebs scheduled. Chest x-ray on personal evaluation does not show any evidence of significant volume overload but possibility of that cannot be ruled out. Low threshold to provide IV Lasix if respiratory status worsens.  Lynden Oxford 6:57 PM 08/01/2019   Nutrition: Cardiac diet DVT Prophylaxis: Therapeutic Anticoagulation with Pradaxa  Advance goals of care discussion: Full code   Consults: I personally Discussed with orthopedics, no official consultation  Family Communication: no family was present at bedside, at the time of interview.   Disposition: Admitted as observation, med-surge unit. Likely to be discharged SNF, in 1 to 2 days once pain control is achieved.  I have discussed plan of care as described above with RN and patient/family.   Author: Lynden Oxford, MD [facility] 08/01/2019 9:47 AM   To reach On-call, see care teams to locate the attending and reach out to them via [email or web address]. If 7PM-7AM, please contact night-coverage If you still have difficulty reaching the attending provider, please page the [facility] (Director on Call) for [facility] on amion for assistance.

## 2019-08-01 NOTE — ED Notes (Signed)
Hospitalist at bedside 

## 2019-08-01 NOTE — ED Notes (Signed)
Rpt given to [facility]

## 2019-08-01 NOTE — Care Management (Signed)
This is a no charge note  Pending admission per Dr. Roslynn Amble  78 year old lady with past medical history of hypertension, hyperlipidemia, PVD, atrial fibrillation on Pradaxa, carotid artery stenosis, who presents with fall and left hip pain.  CT scan showed displaced left inferior pubic rami fracture.  Patient is placed on telemetry bed for observation.     Ivor Costa, MD  [facility]   If 7PM-7AM, please contact night-coverage [email or web address] Password [facility] 08/01/2019, 3:54 AM

## 2019-08-01 NOTE — Evaluation (Addendum)
Physical Therapy Evaluation Patient Details Name: Tammy Barron MRN: [id number] DOB: November 02, 1941 Today's Date: 08/01/2019   History of Present Illness  78 y.o. female admitted with mechanical fall and L hip pain, dx of L inferior pubic rami fracture. PMH of cerebellar CVA 2015, anxiety/depression.  Clinical Impression  Pt admitted with above diagnosis. +2 mod assist for supine to partial sit, pt unable to come to full upright sitting position 2* severe L pelvic pain with activity. Pt reported dizziness with activity. BP 155/60 at rest, 171/64 after activity. SaO2 90% on room air, wheezing noted. ST-SNF recommended as pt lives alone and lacks 24* assistance.  Pt currently with functional limitations due to the deficits listed below (see PT Problem List). Pt will benefit from skilled PT to increase their independence and safety with mobility to allow discharge to the venue listed below.       Follow Up Recommendations SNF;Supervision/Assistance - 24 hour    Equipment Recommendations  None recommended by PT    Recommendations for Other Services       Precautions / Restrictions Precautions Precautions: Fall Precaution Comments: pt denies other falls in past 1 year Restrictions Weight Bearing Restrictions: No      Mobility  Bed Mobility Overal bed mobility: Needs Assistance Bed Mobility: Rolling;Supine to Sit;Sit to Supine Rolling: Min assist   Supine to sit: Mod assist;+2 for physical assistance Sit to supine: Max assist;+2 for physical assistance   General bed mobility comments: assist to initiate movement, assist to raise trunk, pt not able to come to full upright position 2* L pelvic pain. Pt reported dizziness with attempted supine to sit.   BP 155/60 at rest, 177/64 after attempted supine to sit, SaO2 90% on room air, wheezing noted  Transfers                 General transfer comment: unable  Ambulation/Gait             General Gait Details:  unable  Stairs            Wheelchair Mobility    Modified Rankin (Stroke Patients Only)       Balance                                             Pertinent Vitals/Pain Pain Assessment: Faces Faces Pain Scale: Hurts whole lot Pain Location: L pelvis with movement Pain Descriptors / Indicators: Grimacing;Guarding Pain Intervention(s): Limited activity within patient's tolerance;Monitored during session;Repositioned    Home Living Family/patient expects to be discharged to:: Private residence Living Arrangements: Alone   Type of Home: House Home Access: Stairs to enter Entrance Stairs-Rails: Right Entrance Stairs-Number of Steps: 6 Home Layout: One level Home Equipment: Environmental consultant - 2 wheels;Cane - single point;Shower seat;Grab bars - tub/shower Additional Comments: Surveyor, quantity has no grab bar, other bathroom has grab bar    Prior Function Level of Independence: Independent with assistive device(s)         Comments: drives, uses RW or SPC as needed, sometimes uses no AD, denies falls in past 1 year     Hand Dominance        Extremity/Trunk Assessment   Upper Extremity Assessment Upper Extremity Assessment: Defer to OT evaluation    Lower Extremity Assessment Lower Extremity Assessment: LLE deficits/detail LLE Deficits / Details: hip flexion AAROM ~35*, limited by pain, ankle PF/DF  AROM WNL LLE: Unable to fully assess due to pain LLE Sensation: WNL       Communication   Communication: No difficulties  Cognition Arousal/Alertness: Awake/alert Behavior During Therapy: WFL for tasks assessed/performed Overall Cognitive Status: Within Functional Limits for tasks assessed                                        General Comments      Exercises General Exercises - Lower Extremity Ankle Circles/Pumps: AROM;Both;10 reps;Supine   Assessment/Plan    PT Assessment Patient needs continued PT services  PT Problem List  Decreased strength;Decreased activity tolerance;Decreased mobility;Pain       PT Treatment Interventions      PT Goals (Current goals can be found in the Care Plan section)  Acute Rehab PT Goals Patient Stated Goal: to be able to walk and return home PT Goal Formulation: With patient Time For Goal Achievement: 08/15/19 Potential to Achieve Goals: Good    Frequency Min 3X/week   Barriers to discharge Decreased caregiver support lives alone, lacks 24* assist at home    Co-evaluation               AM-PAC PT "6 Clicks" Mobility  Outcome Measure Help needed turning from your back to your side while in a flat bed without using bedrails?: A Little Help needed moving from lying on your back to sitting on the side of a flat bed without using bedrails?: A Lot Help needed moving to and from a bed to a chair (including a wheelchair)?: Total Help needed standing up from a chair using your arms (e.g., wheelchair or bedside chair)?: Total Help needed to walk in hospital room?: Total Help needed climbing 3-5 steps with a railing? : Total 6 Click Score: 9    End of Session   Activity Tolerance: Patient limited by pain Patient left: in bed;with call bell/phone within reach Nurse Communication: Mobility status;Need for lift equipment PT Visit Diagnosis: Difficulty in walking, not elsewhere classified (R26.2);Pain;History of falling (Z91.81) Pain - Right/Left: Left Pain - part of body: Hip    Time: 7858-8502 PT Time Calculation (min) (ACUTE ONLY): 34 min   Charges:   PT Evaluation $PT Eval Moderate Complexity: 1 Mod         Tamala Ser PT 08/01/2019  Acute Rehabilitation Services Pager [phone] Office [phone]

## 2019-08-01 NOTE — Evaluation (Signed)
Occupational Therapy Evaluation Patient Details Name: Tammy Barron MRN: [id number] DOB: 03-04-1941 Today's Date: 08/01/2019    History of Present Illness 78 y.o. female admitted with mechanical fall and L hip pain, dx of L inferior pubic rami fracture. PMH of cerebellar CVA 2015, anxiety/depression.   Clinical Impression   Pt was admitted after a fall resulting in L inferior pubic rami fx. Pt attempted to sit EOB but could not tolerate today. She c/o dizziness; BP WFLs.  Pt did roll to L so that bedding under her could be straightened. She can complete UB adls with set up; she needs max to total +2 assistance for LB adls. Will follow in acute setting with the goals listed below. As mobility improves, will introduce AE for adls    Follow Up Recommendations  SNF    Equipment Recommendations  3 in 1 bedside commode    Recommendations for Other Services       Precautions / Restrictions Precautions Precautions: Fall Precaution Comments: pt denies other falls in past 1 year Restrictions Weight Bearing Restrictions: No      Mobility Bed Mobility Overal bed mobility: Needs Assistance Bed Mobility: Rolling;Supine to Sit;Sit to Supine Rolling: Min assist   Supine to sit: Mod assist;+2 for physical assistance Sit to supine: Max assist;+2 for physical assistance   General bed mobility comments: assist to initiate movement, assist to raise trunk, pt not able to come to full upright position 2* L pelvic pain, pt reported dizziness with attempted supine to sit, BP 155/60 at rest, 177/64 after attempted supine to sit, SaO2 90% on room air, wheezing noted  Transfers                 General transfer comment: unable    Balance                                           ADL either performed or assessed with clinical judgement   ADL Overall ADL's : Needs assistance/impaired Eating/Feeding: Independent   Grooming: Set up   Upper Body Bathing: Set up    Lower Body Bathing: Maximal assistance;+2 for physical assistance   Upper Body Dressing : Minimal assistance   Lower Body Dressing: Total assistance;+2 for physical assistance;Sit to/from stand                 General ADL Comments: from bed level. Pt unable to sit EOB     Vision         Perception     Praxis      Pertinent Vitals/Pain Pain Assessment: Faces Faces Pain Scale: Hurts whole lot Pain Location: L pelvis with movement Pain Descriptors / Indicators: Grimacing;Guarding Pain Intervention(s): Limited activity within patient's tolerance;Monitored during session;Repositioned;Premedicated before session     Hand Dominance Right   Extremity/Trunk Assessment Upper Extremity Assessment Upper Extremity Assessment: Generalized weakness          Communication Communication Communication: No difficulties   Cognition Arousal/Alertness: Awake/alert Behavior During Therapy: WFL for tasks assessed/performed Overall Cognitive Status: Within Functional Limits for tasks assessed                                     General Comments  c/o dizziness.  took 2 seated BPs--see vitals flowsheets    Exercises    Shoulder  Instructions      Home Living Family/patient expects to be discharged to:: Unsure Living Arrangements: Alone   Type of Home: House Home Access: Stairs to enter [employer] of Steps: 6 Entrance Stairs-Rails: Right Home Layout: One level     Bathroom Shower/Tub: Occupational psychologist: Standard     Home Equipment: Environmental consultant - 2 wheels;Cane - single point;Shower seat;Grab bars - tub/shower   Additional Comments: master bathroom has no grab bar, other bathroom has grab bar      Prior Functioning/Environment Level of Independence: Independent with assistive device(s)        Comments: drives, uses RW or SPC as needed, sometimes uses no AD, denies falls in past 1 year        OT Problem List: Decreased  strength;Decreased activity tolerance;Decreased knowledge of use of DME or AE;Pain;Cardiopulmonary status limiting activity      OT Treatment/Interventions: Self-care/ADL training;Energy conservation;DME and/or AE instruction;Therapeutic activities;Balance training;Patient/family education    OT Goals(Current goals can be found in the care plan section) Acute Rehab OT Goals Patient Stated Goal: to be able to walk and return home OT Goal Formulation: With patient Time For Goal Achievement: 08/15/19 Potential to Achieve Goals: Good ADL Goals Pt Will Transfer to Toilet: with mod assist;stand pivot transfer;with +2 assist;bedside commode Additional ADL Goal #1: Pt will perform bed mobility with mod A and sit EOB at supervision level for adls Additional ADL Goal #2: pt will go from sit to stand with mod +2 assistance and maintain x 2 minutes for adls at min guard level  OT Frequency: Min 2X/week   Barriers to D/C:            Co-evaluation PT/OT/SLP Co-Evaluation/Treatment: Yes Reason for Co-Treatment: Complexity of the patient's impairments (multi-system involvement);For patient/therapist safety PT goals addressed during session: Mobility/safety with mobility OT goals addressed during session: ADL's and self-care      AM-PAC OT "6 Clicks" Daily Activity     Outcome Measure Help from another person eating meals?: None Help from another person taking care of personal grooming?: A Little Help from another person toileting, which includes using toliet, bedpan, or urinal?: A Lot Help from another person bathing (including washing, rinsing, drying)?: A Lot Help from another person to put on and taking off regular upper body clothing?: A Little Help from another person to put on and taking off regular lower body clothing?: Total 6 Click Score: 15   End of Session    Activity Tolerance: Patient limited by pain Patient left: in bed;with call bell/phone within reach  OT Visit Diagnosis:  Muscle weakness (generalized) (M62.81);Pain Pain - Right/Left: Left Pain - part of body: Hip                Time: 2876-8115 OT Time Calculation (min): 38 min Charges:  OT General Charges $OT Visit: 1 Visit OT Evaluation $OT Eval Low Complexity: [place], OTR/L Acute Rehabilitation Services [phone] pager [phone] office 08/01/2019  [place] 08/01/2019, 2:58 PM

## 2019-08-01 NOTE — Progress Notes (Addendum)
CSW received a call from [place] at [facility] the patient has been offered a bed and has been accepted and that the pt can arrive on 9/29 if ready for D/C from the Medical Floor.  The pt's accepting doctor is SNF MD.  The room number will be TBD.  The number for report is [phone].  Of note: Per Ebony Hail at [facility], an Josem Kaufmann will have to submit for auth with [facility] Medicare.  Thus, Ebony Hail will have to be informed of pt's D/C date so she will know to "start the ins auth".  CSW will update RN.  Alphonse Guild. Rumi Kolodziej, Latanya Presser, LCAS Clinical Social Worker Ph: [phone]

## 2019-08-02 DIAGNOSIS — F419 Anxiety disorder, unspecified: Secondary | ICD-10-CM | POA: Diagnosis present

## 2019-08-02 DIAGNOSIS — S32592A Other specified fracture of left pubis, initial encounter for closed fracture: Secondary | ICD-10-CM | POA: Diagnosis present

## 2019-08-02 DIAGNOSIS — E876 Hypokalemia: Secondary | ICD-10-CM | POA: Diagnosis not present

## 2019-08-02 DIAGNOSIS — Z79899 Other long term (current) drug therapy: Secondary | ICD-10-CM | POA: Diagnosis not present

## 2019-08-02 DIAGNOSIS — I129 Hypertensive chronic kidney disease with stage 1 through stage 4 chronic kidney disease, or unspecified chronic kidney disease: Secondary | ICD-10-CM | POA: Diagnosis present

## 2019-08-02 DIAGNOSIS — I739 Peripheral vascular disease, unspecified: Secondary | ICD-10-CM | POA: Diagnosis present

## 2019-08-02 DIAGNOSIS — I480 Paroxysmal atrial fibrillation: Secondary | ICD-10-CM | POA: Diagnosis present

## 2019-08-02 DIAGNOSIS — Z8673 Personal history of transient ischemic attack (TIA), and cerebral infarction without residual deficits: Secondary | ICD-10-CM | POA: Diagnosis not present

## 2019-08-02 DIAGNOSIS — Z7901 Long term (current) use of anticoagulants: Secondary | ICD-10-CM | POA: Diagnosis not present

## 2019-08-02 DIAGNOSIS — E785 Hyperlipidemia, unspecified: Secondary | ICD-10-CM | POA: Diagnosis present

## 2019-08-02 DIAGNOSIS — F17210 Nicotine dependence, cigarettes, uncomplicated: Secondary | ICD-10-CM | POA: Diagnosis present

## 2019-08-02 DIAGNOSIS — E871 Hypo-osmolality and hyponatremia: Secondary | ICD-10-CM | POA: Diagnosis present

## 2019-08-02 DIAGNOSIS — J410 Simple chronic bronchitis: Secondary | ICD-10-CM | POA: Diagnosis present

## 2019-08-02 DIAGNOSIS — Z20828 Contact with and (suspected) exposure to other viral communicable diseases: Secondary | ICD-10-CM | POA: Diagnosis present

## 2019-08-02 DIAGNOSIS — N182 Chronic kidney disease, stage 2 (mild): Secondary | ICD-10-CM | POA: Diagnosis present

## 2019-08-02 DIAGNOSIS — S32599A Other specified fracture of unspecified pubis, initial encounter for closed fracture: Secondary | ICD-10-CM | POA: Diagnosis present

## 2019-08-02 DIAGNOSIS — Z91040 Latex allergy status: Secondary | ICD-10-CM | POA: Diagnosis not present

## 2019-08-02 DIAGNOSIS — Z8249 Family history of ischemic heart disease and other diseases of the circulatory system: Secondary | ICD-10-CM | POA: Diagnosis not present

## 2019-08-02 DIAGNOSIS — F329 Major depressive disorder, single episode, unspecified: Secondary | ICD-10-CM | POA: Diagnosis present

## 2019-08-02 DIAGNOSIS — M25552 Pain in left hip: Secondary | ICD-10-CM | POA: Diagnosis present

## 2019-08-02 DIAGNOSIS — W010XXA Fall on same level from slipping, tripping and stumbling without subsequent striking against object, initial encounter: Secondary | ICD-10-CM | POA: Diagnosis present

## 2019-08-02 DIAGNOSIS — Y92480 Sidewalk as the place of occurrence of the external cause: Secondary | ICD-10-CM | POA: Diagnosis not present

## 2019-08-02 LAB — COMPREHENSIVE METABOLIC PANEL
ALT: 16 U/L (ref 0–44)
AST: 26 U/L (ref 15–41)
Albumin: 3.3 g/dL — ABNORMAL LOW (ref 3.5–5.0)
Alkaline Phosphatase: 38 U/L (ref 38–126)
Anion gap: 8 (ref 5–15)
BUN: 15 mg/dL (ref 8–23)
CO2: 24 mmol/L (ref 22–32)
Calcium: 8.5 mg/dL — ABNORMAL LOW (ref 8.9–10.3)
Chloride: 100 mmol/L (ref 98–111)
Creatinine, Ser: 0.73 mg/dL (ref 0.44–1.00)
GFR calc Af Amer: >60 mL/min (ref >60)
GFR calc non Af Amer: >60 mL/min (ref >60)
Glucose, Bld: 121 mg/dL — ABNORMAL HIGH (ref 70–99)
Potassium: 3.3 mmol/L — ABNORMAL LOW (ref 3.5–5.1)
Sodium: 132 mmol/L — ABNORMAL LOW (ref 135–145)
Total Bilirubin: 0.9 mg/dL (ref 0.3–1.2)
Total Protein: 6.2 g/dL — ABNORMAL LOW (ref 6.5–8.1)

## 2019-08-02 LAB — CBC
HCT: 38.7 % (ref 36.0–46.0)
Hemoglobin: 12.4 g/dL (ref 12.0–15.0)
MCH: 30.6 pg (ref 26.0–34.0)
MCHC: 32 g/dL (ref 30.0–36.0)
MCV: 95.6 fL (ref 80.0–100.0)
Platelets: 127 10*3/uL — ABNORMAL LOW (ref 150–400)
RBC: 4.05 MIL/uL (ref 3.87–5.11)
RDW: 12.5 % (ref 11.5–15.5)
WBC: 9 10*3/uL (ref 4.0–10.5)
nRBC: 0 % (ref 0.0–0.2)

## 2019-08-02 LAB — BRAIN NATRIURETIC PEPTIDE: B Natriuretic Peptide: 65 pg/mL (ref 0.0–100.0)

## 2019-08-02 MED ORDER — HYDROCODONE-ACETAMINOPHEN 5-325 MG PO TABS: 1.0000 | ORAL_TABLET | ORAL | 0 refills | Status: DC | PRN

## 2019-08-02 MED ORDER — POTASSIUM CHLORIDE CRYS ER 20 MEQ PO TBCR
20.0000 meq | EXTENDED_RELEASE_TABLET | Freq: Two times a day (BID) | ORAL | Status: AC
  Administered 2019-08-02 – 2019-08-03 (×4): 20 meq via ORAL
  Filled 2019-08-02 (×5): qty 1

## 2019-08-02 MED ORDER — IPRATROPIUM-ALBUTEROL 0.5-2.5 (3) MG/3ML IN SOLN
3.0000 mL | Freq: Three times a day (TID) | RESPIRATORY_TRACT | Status: DC
  Administered 2019-08-02 – 2019-08-03 (×4): 3 mL via RESPIRATORY_TRACT
  Filled 2019-08-02 (×4): qty 3

## 2019-08-02 MED ORDER — TRAMADOL HCL 50 MG PO TABS
50.0000 mg | ORAL_TABLET | Freq: Four times a day (QID) | ORAL | Status: DC | PRN
  Administered 2019-08-02 – 2019-08-04 (×4): 50 mg via ORAL
  Filled 2019-08-02 (×4): qty 1

## 2019-08-02 MED ORDER — ACETAMINOPHEN 500 MG PO TABS
1000.0000 mg | ORAL_TABLET | Freq: Four times a day (QID) | ORAL | Status: DC | PRN
  Administered 2019-08-04: 1000 mg via ORAL
  Filled 2019-08-02: qty 2

## 2019-08-02 NOTE — TOC Progression Note (Signed)
Transition of Care [facility]) - Progression Note    Patient Details  Name: CAYLEA FORONDA MRN: [id number] Date of Birth: 05-21-1941  Transition of [facility]) CM/SW Contact  Jolana Runkles, Juliann Pulse, RN Phone Number: 08/02/2019, 11:44 AM  Clinical Narrative:   08/02/2019 Medicare Nursing Home Results [email or web address], [email or web address]&[email or web address]&paging=131 1/6 Close window 31 hospitals within 25 miles from the center of [place], [place]. Nursing Home Search Results Results List Table Nursing Home Information Overall Rating Health Inpections Staffing Quality Ratings [facility] [place], [place] [address] [phone] Below Average Below Average Below Average Below Average [place], [place] [address] [phone] Below Average Below Average Below Average Average [place], [place] [address] [phone] Much Above Average Much Above Average Average Below Average [place], [place] [address] [phone] Below Average Below Average Below Average Average 2 out of 5 stars 2 out of 5 stars 2 out of 5 stars 2 out of 5 stars 2 out of 5 stars 2 out of 5 stars 2 out of 5 stars 3 out of 5 stars 5 out of 5 stars 5 out of 5 stars 3 out of 5 stars 2 out of 5 stars 2 out of 5 stars 2 out of 5 stars 2 out of 5 stars 3 out of 5 stars 08/02/2019 Medicare Nursing Home Results [email or web address], [email or web address]&sort=[email or web address]&paging=131 2/6 Nursing Home Information Overall Rating Health Inpections Staffing Quality Ratings [facility] [place], [place] [address] [phone] Much  Above Average Above Average Much Above Average Much Above Average [place] [facility] [place], [place] [address] [phone] Much Below Average Much Below Average Below Average Below Average [place], [place] [address] [phone] Much Below Average Much Below Average Below Average Much Below Average [facility] [address] [place], [place] [address] [phone] Much Below Average Much Below Average Below Average Below Average [place], [place] [address] [phone] Below Average Below Average Below Average Above Average [facility] [place], [place] [address] [phone] Much Above Average Much Above Average Much Above Average Much Above Average 5 out of 5 stars 4 out of 5 stars 5 out of 5 stars 5 out of 5 stars 1 out of 5 stars 1 out of 5 stars 2 out of 5 stars 2 out of 5 stars 1 out of 5 stars 1 out of 5 stars 2 out of 5 stars 1 out of 5 stars 1 out of 5 stars 1 out of 5 stars 2 out of 5 stars 2 out of 5 stars 2 out of 5 stars 2 out of 5 stars 2 out of 5 stars 4 out of 5 stars 5 out of 5 stars 5 out of 5 stars 5 out of 5 stars 5 out of 5 stars 08/02/2019 Medicare Nursing Home Results [email or web address], [email or web address]&sort=[email or web address]&paging=131 3/6 Nursing Home Information Overall Rating Health Inpections Staffing Quality Ratings [facility] [address] [place], [place] [address] [phone] Below Average Below Average Below Average Average [facility] [place], [place] [address] [phone] Much Above Average Above Average Much Above Average Much Above Average [facility] [address] [place], [place] [address] [phone] Average Average Below Average Average [facility] [address] [place], [place] [address] [phone] Much Below Average Much Below Average Below Average Average [facility] [address] [place], [place] [address] [phone] Above Average Above Average Below Average Above Average [facility] [address] [place], [place] [address] [phone] Below Average Below Average Below Average Below Average 2 out of 5 stars 2 out of 5 stars 2 out of 5 stars 3 out of 5 stars 5 out of 5 stars 4 out of 5 stars 5 out of 5 stars 5 out of 5 stars 3 out of 5 stars 3 out of 5 stars 2 out of 5 stars 3 out of 5 stars 1 out of 5 stars 1 out of 5 stars 2 out of 5 stars 3 out of 5 stars 4 out of 5 stars 4 out of 5 stars 2 out of 5 stars 4 out of 5 stars 2 out of 5 stars 2 out of 5 stars 2 out of 5 stars 2 out of 5 stars 08/02/2019 Medicare Nursing Home Results [email or web address], [id number]&sort=[email or web address]&paging=131 4/6 Nursing Home Information Overall Rating Health Inpections Staffing Quality Ratings [facility] [address] [place], [place] [address] [phone] Much Above Average Much Above Average Below Average Much Above Average [facility] [address] [place], [place] [address] [phone] Much Above Average Much Above Average Above Average Much Above Average [facility] AND REHABILITA [address] [place], [place] [address] [phone] Much Below Average Much Below Average Below Average Average [facility] [address] [place], [place] [address] [phone] Much Below Average Much Below Average Much Below Average Average [facility] [address] [place], [place] [address] [phone] Much Below Average Much Below Average Below Average Below Average [facility] [address] [place], [place] [address] [phone] Above Average Average Below Average Much Above Average [facility] [address] [place], [place] [address] [phone] Below Average Much Below Average Above Average Average 5 out of 5 stars 5 out of 5 stars 2 out of 5 stars 5 out of 5 stars 5 out of 5 stars 5 out of 5 stars 4 out of 5 stars 5 out of 5 stars 1 out of 5 stars 1 out of 5 stars 2 out of 5 stars 3 out of 5 stars 1 out of 5 stars 1 out of 5 stars 1 out of 5 stars 3 out of 5 stars 1 out of 5 stars 1 out of 5 stars 2 out of 5 stars 2 out of 5 stars 4 out of 5 stars 3 out of 5 stars 2 out of 5 stars 5 out of 5 stars 2 out of 5 stars 1 out of 5 stars 4 out of 5 stars 3 out of 5 stars 08/02/2019 Medicare Nursing Home Results [email or web address], [id number]&sort=19ASC&paging=131 5/6 Nursing Home Information Overall Rating Health Inpections Staffing Quality Ratings [facility] [address] [place], [place] [address] [phone] Much Above Average Above Average Much Above Average Much Above Average [facility] [address] [place], [place] [address] [phone] Much Above Average Above Average Much Above Average Much Above Average THE [facility] & [facility] [address] [place], [place] [address] [phone] Below Average Below Average Below Average Below Average [facility] [address] [place] [address] [address] [place], [place] [id number] [phone] Much Below Average Much Below Average Average Above Average [facility] [address] [place], [place] [phone] Average Average Below Average  Average [facility] [place] [address] [place], [place] [id number] [phone] Average Average Below Average Below Average 5 out of 5 stars 4 out of 5 stars 5 out of 5 stars 5 out of 5 stars 5 out of 5 stars 4 out of 5 stars 5 out of 5 stars 5 out of 5 stars 2 out of 5 stars 2 out of 5 stars 2 out of 5 stars 2 out of 5 stars 1 out of 5 stars 1 out of 5 stars 3 out of 5 stars 4 out of 5 stars 3 out of 5 stars 3 out of 5 stars 2 out of 5 stars 3 out of 5 stars 3 out of 5 stars 3 out of 5 stars 2 out of 5 stars 2 out of 5 stars 08/02/2019 Medicare Nursing Home Results [email or web address]&[email or web address]&paging=131 6/6 Nursing Home Information Overall Rating Health Inpections Staffing Quality Ratings [facility] [address] [place], [place] [phone] Above Average Above Average Above Average Above Average [facility] [address] [place], [place] [phone] Much Below Average Much Below Average Not Available12 Below Average NursingHomeCompare Footnotes Footnote number Footnote as displayed on nursing home Compare 1 Newly certified nursing home with less than 12-15 months of data available or the nursing opened less than 6 months ago, and there were no data to submit or claims for this measure. 2 Not enough data available to calculate a star rating. 6 This facility did not submit staffing data, or submitted data that did not meet the criteria required to calculate a staffing measure. 7 CMS determined that the percentage was not accurate or data suppressed by CMS for one or more quarters. 9 The number of residents or resident stays is too small to report. Call the facility to discuss this quality measure. 10 The data for this measure is missing or was not submitted. Call the facility to  discuss this quality measure. 12 This facility either did not submit staffing data, has reported a high number of days without a registered nurse onsite, or submitted data that could not be verified through an audit. 13 Results are based on a shorter time period than required. 14 This nursing home is not required to submit data for the Skilled Nursing Facility Quality Reporting Program. 18 This facility is not rated due to a history of serious quality issues and is included in the special focus facility program. 4 out of 5 stars 4 out of 5 stars 4 out of 5 stars 4 out of 5 stars 1 out of 5 stars 1 out of 5 stars 2 out of 5 stars    Expected Discharge Plan: Skilled Nursing Facility Barriers to Discharge: SNF Pending bed offer, Continued Medical Work up  Expected Discharge Plan and Services Expected Discharge Plan: Skilled Nursing Facility In-house Referral: Clinical Social Work   Post Acute Care Choice: Skilled Nursing Facility Living arrangements for the past 2 months: Single Family Home Expected Discharge Date: (unknon)                                     Social Determinants of  Health (SDOH) Interventions    Readmission Risk Interventions No flowsheet data found.

## 2019-08-02 NOTE — Progress Notes (Signed)
PROGRESS NOTE    Tammy ClarksMargaret L Barron  WJX:[id number] DOB: 1941-09-10 DOA: 07/31/2019 PCP: Wynn BankerKoberlein, Tammy C, MD    Brief Narrative:  78 year old female with history of A. fib, peripheral artery disease, hypertension, hyperlipidemia, history of stroke and anxiety admitted to the hospital with pelvic fracture, left inferior pubic rami fracture with no complications, severe pain and inability to ambulate.  Also developed some wheezing on presentation.   Assessment & Plan:   Principal Problem:   Closed fracture of pubic ramus, left, initial encounter [facility](HCC) Active Problems:   Dyslipidemia   Essential hypertension   AF (paroxysmal atrial fibrillation) (HCC)   Peripheral artery disease (HCC)   Fall   H/O: CVA (cerebrovascular accident)   CKD (chronic kidney disease) stage 2, GFR 60-89 ml/min   mild Hyponatremia   Chronic anticoagulation   Closed stable fracture of multiple pubic rami (HCC)  Closed fracture of the pubic ramus, left inferior MI with pain and difficulty ambulation: Currently stabilizing.  Adequate pain management, mobility with PT OT. Orthopedic recommended conservative management and mobility. Patient lives alone, she has significant discomfort and will benefit with inpatient therapies, referred to skilled nursing facility.  Hypertension: Blood pressures are fairly stable.  Home medications were resumed.  History of a stroke and paroxysmal A. fib: Currently sinus rhythm.  No new neurological deficit.  She is on Pradaxa that was continued.  Rate controlled.  Chronic cough with wheezing: Probably has a smoker's cough.  Treated with nebulizer with improvement of symptoms.  She has low-grade temperature recorded in the hospital, WBC count is normal.  No localizing evidence of infection.  COVID-19 was negative.  Will monitor.  No indication for antibiotic at this time.  Hypokalemia: We will replace.  Patient has significant pain and discomfort, she is restricted with her  mobility.  She is not able to go home and current situation.  She is anticipated to stay in the hospital and transferred to skilled nursing facility for inpatient therapies.  Unsafe to go home.  Will change to inpatient.   DVT prophylaxis: On Pradaxa Code Status: Full code Family Communication: None, left voice message for patient's son to call back Disposition Plan: Skilled nursing facility.  Anticipate tomorrow.  Waiting for prior Auth   Consultants:   Orthopedics, on consultation  Procedures:   None  Antimicrobials:   None   Subjective: Patient seen and examined.  No overnight events.  Her main concern was going to a nursing home instead of going home, however she is agreeable that she may not be able to get around due to pain and discomfort as well as restricted mobility.  Objective: Vitals:   08/01/19 2000 08/01/19 2019 08/02/19 0439 08/02/19 0725  BP: (!) 164/92  (!) 151/54   Pulse: 89  87   Resp: 18  20   Temp: 98.7 F (37.1 C)  100.1 F (37.8 C)   TempSrc: Oral  Oral   SpO2: 95% 92% 91% 92%  Weight:      Height:        Intake/Output Summary (Last 24 hours) at 08/02/2019 1129 Last data filed at 08/02/2019 1000 Gross per 24 hour  Intake 160 ml  Output 400 ml  Net -240 ml   Filed Weights   07/31/19 2203  Weight: 77.1 kg    Examination:  General exam: Appears calm and comfortable, mildly anxious with pain on her left pelvis. Respiratory system: Clear to auscultation. Respiratory effort normal. Cardiovascular system: S1 & S2 heard, RRR. No JVD,  murmurs, rubs, gallops or clicks. No pedal edema. Gastrointestinal system: Abdomen is nondistended, soft and nontender. No organomegaly or masses felt. Normal bowel sounds heard. Central nervous system: Alert and oriented. No focal neurological deficits. Extremities: Symmetric 5 x 5 power. Skin: No rashes, lesions or ulcers Psychiatry: Judgement and insight appear normal. Mood & affect appropriate.     Data  Reviewed: I have personally reviewed following labs and imaging studies  CBC: Recent Labs  Lab 07/31/19 2212 08/02/19 0548  WBC 11.0* 9.0  NEUTROABS 9.2*  --   HGB 14.5 12.4  HCT 43.7 38.7  MCV 91.6 95.6  PLT 160 956*   Basic Metabolic Panel: Recent Labs  Lab 07/31/19 2212 08/02/19 0548  NA 134* 132*  K 4.3 3.3*  CL 100 100  CO2 24 24  GLUCOSE 130* 121*  BUN 21 15  CREATININE 1.07* 0.73  CALCIUM 9.4 8.5*   GFR: Estimated Creatinine Clearance: 55.2 mL/min (by C-G formula based on SCr of 0.73 mg/dL). Liver Function Tests: Recent Labs  Lab 08/02/19 0548  AST 26  ALT 16  ALKPHOS 38  BILITOT 0.9  PROT 6.2*  ALBUMIN 3.3*   No results for input(s): LIPASE, AMYLASE in the last 168 hours. No results for input(s): AMMONIA in the last 168 hours. Coagulation Profile: No results for input(s): INR, PROTIME in the last 168 hours. Cardiac Enzymes: No results for input(s): CKTOTAL, CKMB, CKMBINDEX, TROPONINI in the last 168 hours. BNP (last 3 results) No results for input(s): PROBNP in the last 8760 hours. HbA1C: No results for input(s): HGBA1C in the last 72 hours. CBG: No results for input(s): GLUCAP in the last 168 hours. Lipid Profile: No results for input(s): CHOL, HDL, LDLCALC, TRIG, CHOLHDL, LDLDIRECT in the last 72 hours. Thyroid Function Tests: No results for input(s): TSH, T4TOTAL, FREET4, T3FREE, THYROIDAB in the last 72 hours. Anemia Panel: No results for input(s): VITAMINB12, FOLATE, FERRITIN, TIBC, IRON, RETICCTPCT in the last 72 hours. Sepsis Labs: No results for input(s): PROCALCITON, LATICACIDVEN in the last 168 hours.  Recent Results (from the past 240 hour(s))  SARS CORONAVIRUS 2 (TAT 6-24 HRS) Nasopharyngeal Nasopharyngeal Swab     Status: None   Collection Time: 08/01/19 12:01 AM   Specimen: Nasopharyngeal Swab  Result Value Ref Range Status   SARS Coronavirus 2 NEGATIVE NEGATIVE Final    Comment: (NOTE) SARS-CoV-2 target nucleic acids are NOT  DETECTED. The SARS-CoV-2 RNA is generally detectable in upper and lower respiratory specimens during the acute phase of infection. Negative results do not preclude SARS-CoV-2 infection, do not rule out co-infections with other pathogens, and should not be used as the sole basis for treatment or other patient management decisions. Negative results must be combined with clinical observations, patient history, and epidemiological information. The expected result is Negative. Fact Sheet for Patients: SugarRoll.be Fact Sheet for Healthcare Providers: https://www.woods-mathews.com/ This test is not yet approved or cleared by the [place] FDA and  has been authorized for detection and/or diagnosis of SARS-CoV-2 by FDA under an Emergency Use Authorization (EUA). This EUA will remain  in effect (meaning this test can be used) for the duration of the COVID-19 declaration under Section 56 4(b)(1) of the Act, 21 U.S.C. section 360bbb-3(b)(1), unless the authorization is terminated or revoked sooner. Performed at [facility], [place] [address], [place], [place] [address]          Radiology Studies: Dg Chest 2 View  Result Date: 07/31/2019 CLINICAL DATA:  Post fall with left  hip pain. EXAM: CHEST - 2 VIEW COMPARISON:  Radiograph 02/06/2014 FINDINGS: The cardiomediastinal contours are normal. Atherosclerosis of the aortic arch. Pulmonary vasculature is normal. No consolidation, pleural effusion, or pneumothorax. No acute osseous abnormalities are seen. IMPRESSION: 1. No acute abnormality. 2.  Aortic Atherosclerosis (ICD10-I70.0). Electronically Signed   By: Narda Rutherford M.D.   On: 07/31/2019 22:10   Ct Hip Left Wo Contrast  Result Date: 07/31/2019 CLINICAL DATA:  78 year old female status post fall with left hip pain. EXAM: CT OF THE LEFT HIP WITHOUT CONTRAST TECHNIQUE: Multidetector CT imaging of the left hip was performed according to the  standard protocol. Multiplanar CT image reconstructions were also generated. COMPARISON:  Left hip x-rays 2200 hours today. FINDINGS: Diverticulosis of the sigmoid colon, normal diminutive left ovary, small pelvic phleboliths and iliac artery calcified atherosclerosis in the visible pelvis. No pelvic free fluid. Intact visible left sacral ala and SI joint. Oblique minimally displaced fracture of the left inferior pubic ramus on series 2, image 64. The pubic symphysis, left superior ramus, left acetabulum and iliac wing remain intact. The proximal left femur is intact. There is some subcutaneous soft tissue contusion/hematoma overlying the left greater trochanter on series 3, image 66. No other superficial soft tissue injury identified. Calcified plaque continues in the left femoral artery. No left inguinal lymphadenopathy. IMPRESSION: 1. Minimally displaced left inferior pubic ramus fracture. 2. No other acute fracture identified about the left hip. There is a mild soft tissue contusion/hematoma overlying the left greater trochanter. 3. Sigmoid diverticulosis. Electronically Signed   By: Odessa Fleming M.D.   On: 07/31/2019 23:03   Dg Chest Port 1 View  Result Date: 08/01/2019 CLINICAL DATA:  78 y.o female. SOB. Anticipated discharge. EXAM: PORTABLE CHEST 1 VIEW COMPARISON:  None. FINDINGS: Normal mediastinum and cardiac silhouette. Normal pulmonary vasculature. No evidence of effusion, infiltrate, or pneumothorax. No acute bony abnormality. IMPRESSION: No acute cardiopulmonary process. Electronically Signed   By: Genevive Bi M.D.   On: 08/01/2019 19:04   Dg Hip Unilat W Or Wo Pelvis 2-3 Views Left  Result Date: 07/31/2019 CLINICAL DATA:  Post fall today with left hip pain. EXAM: DG HIP (WITH OR WITHOUT PELVIS) 2-3V LEFT COMPARISON:  None. FINDINGS: The cortical margins of the bony pelvis and left hip are intact. No fracture. Pubic symphysis and sacroiliac joints are congruent. Both femoral heads are  well-seated in the respective acetabula. IMPRESSION: No visualized fracture of the pelvis or left hip. Electronically Signed   By: Narda Rutherford M.D.   On: 07/31/2019 22:09        Scheduled Meds: . atorvastatin  10 mg Oral q1800  . bisoprolol  10 mg Oral Daily  . dabigatran  150 mg Oral BID  . ipratropium-albuterol  3 mL Nebulization TID  . potassium chloride  20 mEq Oral BID  . senna-docusate  1 tablet Oral BID   Continuous Infusions:   LOS: 0 days    Time spent: 25 minutes    Dorcas Carrow, MD [facility] Pager [phone]  If 7PM-7AM, please contact night-coverage [email or web address] Password [email or web address] 08/02/2019, 11:29 AM

## 2019-08-02 NOTE — TOC Progression Note (Signed)
Transition of Care [facility]) - Progression Note    Patient Details  Name: Tammy Barron MRN: [id number] Date of Birth: 05-16-1941  [facility]) CM/SW Contact  [name], Juliann Pulse, RN Phone Number: 08/02/2019, 11:44 AM  Clinical Narrative: Provided patient w/SNF bed offers-allowed CM to contact her son Doren Custard also to asst w/bed offers for choice.      Expected Discharge Plan: [place] Barriers to Discharge: SNF Pending bed offer, Continued Medical Work up  Expected Discharge Plan and Services Expected Discharge Plan: [place] In-house Referral: Clinical Social Work   Post Acute Care Choice: [place] Living arrangements for the past 2 months: Single Family Home Expected Discharge Date: (unknon)                                     Social Determinants of Health (SDOH) Interventions    Readmission Risk Interventions No flowsheet data found.

## 2019-08-02 NOTE — Plan of Care (Signed)
  Problem: Education: Goal: Knowledge of General Education information will improve Description: Including pain rating scale, medication(s)/side effects and non-pharmacologic comfort measures Outcome: Completed/Met   Problem: Clinical Measurements: Goal: Ability to maintain clinical measurements within normal limits will improve Outcome: Completed/Met Goal: Will remain free from infection Outcome: Completed/Met Goal: Diagnostic test results will improve Outcome: Completed/Met Goal: Respiratory complications will improve Outcome: Completed/Met Goal: Cardiovascular complication will be avoided Outcome: Completed/Met   Problem: Activity: Goal: Risk for activity intolerance will decrease Outcome: Progressing   Problem: Nutrition: Goal: Adequate nutrition will be maintained Outcome: Completed/Met   Problem: Coping: Goal: Level of anxiety will decrease Outcome: Completed/Met   Problem: Elimination: Goal: Will not experience complications related to bowel motility Outcome: Completed/Met Goal: Will not experience complications related to urinary retention Outcome: Completed/Met   Problem: Pain Managment: Goal: General experience of comfort will improve Outcome: Progressing   Problem: Safety: Goal: Ability to remain free from injury will improve Outcome: Progressing   Problem: Skin Integrity: Goal: Risk for impaired skin integrity will decrease Outcome: Completed/Met

## 2019-08-03 MED ORDER — TRAMADOL HCL 50 MG PO TABS: 50.0000 mg | ORAL_TABLET | Freq: Four times a day (QID) | ORAL | 0 refills | Status: AC | PRN

## 2019-08-03 MED ORDER — IPRATROPIUM-ALBUTEROL 0.5-2.5 (3) MG/3ML IN SOLN
3.0000 mL | Freq: Two times a day (BID) | RESPIRATORY_TRACT | Status: DC
  Administered 2019-08-03 – 2019-08-04 (×2): 3 mL via RESPIRATORY_TRACT
  Filled 2019-08-03 (×2): qty 3

## 2019-08-03 NOTE — Progress Notes (Signed)
PROGRESS NOTE    Tammy Barron  JXB:[id number] DOB: 14-Jul-1941 DOA: 07/31/2019 PCP: Caren Macadam, MD    Brief Narrative:  78 year old female with history of A. fib, peripheral artery disease, hypertension, hyperlipidemia, history of stroke and anxiety admitted to the hospital with pelvic fracture, left inferior pubic rami fracture with no complications, severe pain and inability to ambulate.  Also developed some wheezing on presentation.   Assessment & Plan:   Principal Problem:   Closed fracture of pubic ramus, left, initial encounter [facility]) Active Problems:   Dyslipidemia   Essential hypertension   AF (paroxysmal atrial fibrillation) (HCC)   Peripheral artery disease ([place])   Fall   H/O: CVA (cerebrovascular accident)   CKD (chronic kidney disease) stage 2, GFR 60-89 ml/min   mild Hyponatremia   Chronic anticoagulation   Closed stable fracture of multiple pubic rami (HCC)  Closed fracture of the pubic ramus, left inferior rami with pain and difficulty ambulation: Currently stabilizing.  Adequate pain management, mobility with PT OT. Orthopedic recommended conservative management and mobility. Patient lives alone, she has significant discomfort and will benefit with inpatient therapies, referred to skilled nursing facility.  Hypertension: Blood pressures are fairly stable.  Home medications were resumed.  History of stroke and paroxysmal A. fib: Currently sinus rhythm.  No new neurological deficit.  She is on Pradaxa that was continued.  Rate controlled.  Chronic cough with wheezing: Probably has a smoker's cough.  Treated with nebulizer with improvement of symptoms.  She has low-grade temperature recorded in the hospital, WBC count is normal.  No localizing evidence of infection.  COVID-19 was negative.  Will monitor.  No indication for antibiotic at this time.  Hypokalemia: Replaced.   DVT prophylaxis: On Pradaxa Code Status: Full code Family Communication:  Patient's son. Disposition Plan: Skilled nursing facility.  Medically stable.  Can be transferred when bed is available.  Consultants:   Orthopedics, phone consultation  Procedures:   None  Antimicrobials:   None   Subjective: Patient seen and examined.  No overnight events.  Some pain persist on movement.  Otherwise no other complaints.  No more wheezing or cough.  Objective: Vitals:   08/02/19 1949 08/02/19 2106 08/03/19 0414 08/03/19 0719  BP:  (!) 167/71 (!) 167/99   Pulse:  84 84   Resp:  18 18   Temp:  98.7 F (37.1 C) 98.7 F (37.1 C)   TempSrc:      SpO2: 94% 94% (!) 89% 90%  Weight:      Height:        Intake/Output Summary (Last 24 hours) at 08/03/2019 1130 Last data filed at 08/03/2019 0458 Gross per 24 hour  Intake 300 ml  Output 500 ml  Net -200 ml   Filed Weights   07/31/19 2203  Weight: 77.1 kg    Examination:  General exam: Appears calm and comfortable, on room air. Respiratory system: Clear to auscultation. Respiratory effort normal. Cardiovascular system: S1 & S2 heard, RRR. No JVD, murmurs, rubs, gallops or clicks. No pedal edema. Gastrointestinal system: Abdomen is nondistended, soft and nontender. No organomegaly or masses felt. Normal bowel sounds heard. Central nervous system: Alert and oriented. No focal neurological deficits. Extremities: Symmetric 5 x 5 power. Skin: No rashes, lesions or ulcers Psychiatry: Judgement and insight appear normal. Mood & affect appropriate.     Data Reviewed: I have personally reviewed following labs and imaging studies  CBC: Recent Labs  Lab 07/31/19 2212 08/02/19 0548  WBC 11.0*  9.0  NEUTROABS 9.2*  --   HGB 14.5 12.4  HCT 43.7 38.7  MCV 91.6 95.6  PLT 160 127*   Basic Metabolic Panel: Recent Labs  Lab 07/31/19 2212 08/02/19 0548  NA 134* 132*  K 4.3 3.3*  CL 100 100  CO2 24 24  GLUCOSE 130* 121*  BUN 21 15  CREATININE 1.07* 0.73  CALCIUM 9.4 8.5*   GFR: Estimated Creatinine  Clearance: 55.2 mL/min (by C-G formula based on SCr of 0.73 mg/dL). Liver Function Tests: Recent Labs  Lab 08/02/19 0548  AST 26  ALT 16  ALKPHOS 38  BILITOT 0.9  PROT 6.2*  ALBUMIN 3.3*   No results for input(s): LIPASE, AMYLASE in the last 168 hours. No results for input(s): AMMONIA in the last 168 hours. Coagulation Profile: No results for input(s): INR, PROTIME in the last 168 hours. Cardiac Enzymes: No results for input(s): CKTOTAL, CKMB, CKMBINDEX, TROPONINI in the last 168 hours. BNP (last 3 results) No results for input(s): PROBNP in the last 8760 hours. HbA1C: No results for input(s): HGBA1C in the last 72 hours. CBG: No results for input(s): GLUCAP in the last 168 hours. Lipid Profile: No results for input(s): CHOL, HDL, LDLCALC, TRIG, CHOLHDL, LDLDIRECT in the last 72 hours. Thyroid Function Tests: No results for input(s): TSH, T4TOTAL, FREET4, T3FREE, THYROIDAB in the last 72 hours. Anemia Panel: No results for input(s): VITAMINB12, FOLATE, FERRITIN, TIBC, IRON, RETICCTPCT in the last 72 hours. Sepsis Labs: No results for input(s): PROCALCITON, LATICACIDVEN in the last 168 hours.  Recent Results (from the past 240 hour(s))  SARS CORONAVIRUS 2 (TAT 6-24 HRS) Nasopharyngeal Nasopharyngeal Swab     Status: None   Collection Time: 08/01/19 12:01 AM   Specimen: Nasopharyngeal Swab  Result Value Ref Range Status   SARS Coronavirus 2 NEGATIVE NEGATIVE Final    Comment: (NOTE) SARS-CoV-2 target nucleic acids are NOT DETECTED. The SARS-CoV-2 RNA is generally detectable in upper and lower respiratory specimens during the acute phase of infection. Negative results do not preclude SARS-CoV-2 infection, do not rule out co-infections with other pathogens, and should not be used as the sole basis for treatment or other patient management decisions. Negative results must be combined with clinical observations, patient history, and epidemiological information. The expected  result is Negative. Fact Sheet for Patients: HairSlick.nohttps://www.fda.gov/media/138098/download Fact Sheet for Healthcare Providers: quierodirigir.comhttps://www.fda.gov/media/138095/download This test is not yet approved or cleared by the [place] FDA and  has been authorized for detection and/or diagnosis of SARS-CoV-2 by FDA under an Emergency Use Authorization (EUA). This EUA will remain  in effect (meaning this test can be used) for the duration of the COVID-19 declaration under Section 56 4(b)(1) of the Act, 21 U.S.C. section 360bbb-3(b)(1), unless the authorization is terminated or revoked sooner. Performed at [facility], [address] [place], [place] [address]          Radiology Studies: Dg Chest [place] 1 View  Result Date: 08/01/2019 CLINICAL DATA:  78 y.o female. SOB. Anticipated discharge. EXAM: PORTABLE CHEST 1 VIEW COMPARISON:  None. FINDINGS: Normal mediastinum and cardiac silhouette. Normal pulmonary vasculature. No evidence of effusion, infiltrate, or pneumothorax. No acute bony abnormality. IMPRESSION: No acute cardiopulmonary process. Electronically Signed   By: Genevive BiStewart  Edmunds M.D.   On: 08/01/2019 19:04        Scheduled Meds: . atorvastatin  10 mg Oral q1800  . bisoprolol  10 mg Oral Daily  . dabigatran  150 mg Oral BID  . ipratropium-albuterol  3 mL Nebulization TID  . potassium chloride  20 mEq Oral BID  . senna-docusate  1 tablet Oral BID   Continuous Infusions:   LOS: 1 day    Time spent: 25 minutes    Dorcas Carrow, MD [facility] Pager [phone]  If 7PM-7AM, please contact night-coverage [email or web address] Password [email or web address] 08/03/2019, 11:30 AM

## 2019-08-03 NOTE — TOC Progression Note (Signed)
Transition of Care [facility]) - Progression Note    Patient Details  Name: Tammy Barron MRN: [id number] Date of Birth: 04-05-1941  Transition of Care [facility]) CM/SW Contact  Nashae Maudlin, Juliann Pulse, RN Phone Number: 08/03/2019, 2:02 PM  Clinical Narrative:CM initiated auth-contacted [facility] for [facility] medicare-tel#[phone] faxed w/confirmation to fax#[phone]-await call back for auth under TKP#[id number].      Expected Discharge Plan: Skilled Nursing Facility Barriers to Discharge: Insurance Authorization  Expected Discharge Plan and Services Expected Discharge Plan: [place] In-house Referral: Clinical Social Work   Post Acute Care Choice: [place] Living arrangements for the past 2 months: Single Family Home Expected Discharge Date: (unknon)                                     Social Determinants of Health (SDOH) Interventions    Readmission Risk Interventions No flowsheet data found.

## 2019-08-03 NOTE — TOC Progression Note (Signed)
Transition of Care [facility]) - Progression Note    Patient Details  Name: Tammy Barron MRN: [id number] Date of Birth: 02-24-1941  Transition of Care [facility]) CM/SW Contact  Sansa Alkema, Juliann Pulse, RN Phone Number: 08/03/2019, 1:12 PM  Clinical Narrative:  Laurell Josephs chosen rep Lexine Baton following for [facility]. covid done on 9/29.     Expected Discharge Plan: Skilled Nursing Facility Barriers to Discharge: Insurance Authorization  Expected Discharge Plan and Services Expected Discharge Plan: [place] In-house Referral: Clinical Social Work   Post Acute Care Choice: [place] Living arrangements for the past 2 months: Single Family Home Expected Discharge Date: (unknon)                                     Social Determinants of Health (SDOH) Interventions    Readmission Risk Interventions No flowsheet data found.

## 2019-08-03 NOTE — Progress Notes (Signed)
Physical Therapy Treatment Patient Details Name: Tammy Barron MRN: [id number] DOB: 08/29/1941 Today's Date: 08/03/2019    History of Present Illness 78 y.o. female admitted with mechanical fall and L hip pain, dx of L inferior pubic rami fracture. PMH of cerebellar CVA 2015, anxiety/depression.    PT Comments    Supine to sit with +2 mod assist, sit to stand +2 mod assist. Pt unable to weight shift to take steps with RW. Pt is progressing with mobility. ST-SNF recommended.   Follow Up Recommendations  SNF;Supervision/Assistance - 24 hour     Equipment Recommendations  None recommended by PT    Recommendations for Other Services       Precautions / Restrictions Precautions Precautions: Fall Restrictions Weight Bearing Restrictions: Yes LLE Weight Bearing: Weight bearing as tolerated    Mobility  Bed Mobility Overal bed mobility: Needs Assistance Bed Mobility: Supine to Sit     Supine to sit: Mod assist;+2 for physical assistance     General bed mobility comments: assist to initiate movement, assist to raise trunk to sit  Transfers Overall transfer level: Needs assistance Equipment used: Rolling walker (2 wheeled) Transfers: Sit to/from Stand Sit to Stand: +2 physical assistance;Mod assist;From elevated surface         General transfer comment: assist to rise from elevated bed and from 3 in 1 (sit to stand x 2); pt unable to weight shift to take steps, recliner pulled behind pt while she was in standing  Ambulation/Gait             General Gait Details: unable   Stairs             Wheelchair Mobility    Modified Rankin (Stroke Patients Only)       Balance Overall balance assessment: Needs assistance Sitting-balance support: Feet supported Sitting balance-Leahy Scale: Fair     Standing balance support: Bilateral upper extremity supported Standing balance-Leahy Scale: Poor Standing balance comment: relies on BUE support                             Cognition Arousal/Alertness: Awake/alert Behavior During Therapy: WFL for tasks assessed/performed Overall Cognitive Status: Within Functional Limits for tasks assessed                                        Exercises General Exercises - Lower Extremity Heel Slides: AAROM;Left;10 reps;Supine Hip ABduction/ADduction: AAROM;Left;10 reps;Supine    General Comments        Pertinent Vitals/Pain Pain Score: 3  Pain Location: L groin Pain Descriptors / Indicators: Aching Pain Intervention(s): Limited activity within patient's tolerance;Monitored during session;Premedicated before session    Home Living                      Prior Function            PT Goals (current goals can now be found in the care plan section) Acute Rehab PT Goals Patient Stated Goal: to be able to walk and return home PT Goal Formulation: With patient/family Time For Goal Achievement: 08/15/19 Potential to Achieve Goals: Good Progress towards PT goals: Progressing toward goals    Frequency    Min 3X/week      PT Plan Current plan remains appropriate    Co-evaluation PT/OT/SLP Co-Evaluation/Treatment: Yes  AM-PAC PT "6 Clicks" Mobility   Outcome Measure  Help needed turning from your back to your side while in a flat bed without using bedrails?: A Little Help needed moving from lying on your back to sitting on the side of a flat bed without using bedrails?: A Lot Help needed moving to and from a bed to a chair (including a wheelchair)?: A Lot Help needed standing up from a chair using your arms (e.g., wheelchair or bedside chair)?: A Lot Help needed to walk in hospital room?: Total Help needed climbing 3-5 steps with a railing? : Total 6 Click Score: 11    End of Session Equipment Utilized During Treatment: Gait belt Activity Tolerance: Patient limited by pain Patient left: with call bell/phone within reach;in chair;with  chair alarm set;with family/visitor present Nurse Communication: Mobility status;Need for lift equipment PT Visit Diagnosis: Difficulty in walking, not elsewhere classified (R26.2);Pain;History of falling (Z91.81) Pain - Right/Left: Left Pain - part of body: Hip     Time: 4650-3546 PT Time Calculation (min) (ACUTE ONLY): 28 min  Charges:  $Therapeutic Activity: 23-37 mins                     Blondell Reveal Kistler PT 08/03/2019  Acute Rehabilitation Services Pager [phone] Office [phone]

## 2019-08-03 NOTE — TOC Progression Note (Signed)
Transition of Care [facility]) - Progression Note    Patient Details  Name: TALMA AGUILLARD MRN: [id number] Date of Birth: 05/30/1941  Transition of [facility]) CM/SW Contact  Darey Hershberger, Juliann Pulse, RN Phone Number: 08/03/2019, 10:47 AM  Clinical Narrative: Patient chose [facility]-rep Janie aware-await if able to accept-they will need to get auth. Last covid 9/29.      Expected Discharge Plan: [place] Barriers to Discharge: SNF Pending bed offer, Continued Medical Work up  Expected Discharge Plan and Services Expected Discharge Plan: [place] In-house Referral: Clinical Social Work   Post Acute Care Choice: [place] Living arrangements for the past 2 months: Single Family Home Expected Discharge Date: ([place]kn[place])                                     Social Determinants of Health (SDOH) Interventions    Readmission Risk Interventions No flowsheet data found.

## 2019-08-04 MED ORDER — CLORAZEPATE DIPOTASSIUM 3.75 MG PO TABS: 3.7500 mg | ORAL_TABLET | Freq: Two times a day (BID) | ORAL | 0 refills | Status: AC | PRN

## 2019-08-04 NOTE — Care Management Important Message (Signed)
Important Message  Patient Details IM Letter given to Dessa Phi RN to present to the Patient Name: Tammy Barron MRN: [id number] Date of Birth: 1941-01-26   Medicare Important Message Given:  Yes     Kerin Salen 08/04/2019, 12:15 PM

## 2019-08-04 NOTE — Progress Notes (Signed)
Physical Therapy Treatment Patient Details Name: Tammy Barron MRN: [id number] DOB: [birth year] Today's Date: 08/04/2019    History of Present Illness 78 y.o. female admitted with mechanical fall and L hip pain, dx of L inferior pubic rami fracture. PMH of cerebellar CVA 2015, anxiety/depression.    PT Comments    Pt progressing. incr ability to st shift and take small steps during stand pivot today.  Continue to recommend SNF post acute   Follow Up Recommendations  SNF;Supervision/Assistance - 24 hour     Equipment Recommendations  None recommended by PT    Recommendations for Other Services       Precautions / Restrictions Precautions Precautions: Fall Precaution Comments: pt denies other falls in past 1 year Restrictions Weight Bearing Restrictions: No LLE Weight Bearing: Weight bearing as tolerated    Mobility  Bed Mobility Overal bed mobility: Needs Assistance Bed Mobility: Supine to Sit     Supine to sit: Min assist;Mod assist     General bed mobility comments: assist to initiate LEs, assist to elevate trunk  Transfers Overall transfer level: Needs assistance Equipment used: Rolling walker (2 wheeled) Transfers: Sit to/from Stand Sit to Stand: Min assist;+2 physical assistance;+2 safety/equipment         General transfer comment: assist for anterior-superior wt shift and to transition to RW. incr time adn multi-modal cues for stand pivot to recliner  Ambulation/Gait             General Gait Details: 2 small steps forward, then pivotal steps to recliner   Stairs             Wheelchair Mobility    Modified Rankin (Stroke Patients Only)       Balance     Sitting balance-Leahy Scale: Fair     Standing balance support: Bilateral upper extremity supported Standing balance-Leahy Scale: Poor Standing balance comment: reliant on bil UE support and external assist to maintain static standing                             Cognition Arousal/Alertness: Awake/alert Behavior During Therapy: WFL for tasks assessed/performed Overall Cognitive Status: Within Functional Limits for tasks assessed                                        Exercises      General Comments        Pertinent Vitals/Pain Pain Assessment: 0-10 Pain Score: 5  Pain Location: L groin with activity, especially WBing Pain Descriptors / Indicators: Aching;Grimacing;Sore Pain Intervention(s): Limited activity within patient's tolerance;Monitored during session;Premedicated before session;Repositioned    Home Living                      Prior Function            PT Goals (current goals can now be found in the care plan section) Acute Rehab PT Goals Patient Stated Goal: to be able to walk and return home PT Goal Formulation: With patient/family Time For Goal Achievement: 08/15/19 Potential to Achieve Goals: Good Progress towards PT goals: Progressing toward goals    Frequency    Min 3X/week      PT Plan Current plan remains appropriate    Co-evaluation              AM-PAC PT "6 Clicks" Mobility  Outcome Measure  Help needed turning from your back to your side while in a flat bed without using bedrails?: A Little Help needed moving from lying on your back to sitting on the side of a flat bed without using bedrails?: A Lot Help needed moving to and from a bed to a chair (including a wheelchair)?: A Lot Help needed standing up from a chair using your arms (e.g., wheelchair or bedside chair)?: A Lot Help needed to walk in hospital room?: Total Help needed climbing 3-5 steps with a railing? : Total 6 Click Score: 11    End of Session Equipment Utilized During Treatment: Gait belt Activity Tolerance: Patient tolerated treatment well Patient left: in chair;with call bell/phone within reach;with chair alarm set   PT Visit Diagnosis: Difficulty in walking, not elsewhere classified  (R26.2);Pain;History of falling (Z91.81) Pain - Right/Left: Left Pain - part of body: Hip     Time: 2706-2376 PT Time Calculation (min) (ACUTE ONLY): 13 min  Charges:  $Therapeutic Activity: 8-22 mins                     Kenyon Ana, PT  Pager: [phone] Acute Rehab Dept [facility]): [phone]   08/04/2019    [facility] 08/04/2019, 12:24 PM

## 2019-08-04 NOTE — TOC Transition Note (Signed)
Transition of Care [facility]) - CM/SW Discharge Note   Patient Details  Name: Tammy Barron MRN: [id number] Date of Birth: 1941-09-04  Transition of Care [facility]) CM/SW Contact:  Dessa Phi, RN Phone Number: 08/04/2019, 12:53 PM   Clinical Narrative:  D/c today to [facility] [phone] SNF. [place], report tel# [phone].      Final next level of care: Skilled Nursing Facility Barriers to Discharge: No Barriers Identified   Patient Goals and CMS Choice Patient states their goals for this hospitalization and ongoing recovery are:: "feel better with my hip" CMS Medicare.gov Compare Post Acute Care list provided to:: Patient Choice offered to / list presented to : Sibling  Discharge Placement PASRR number recieved: 08/03/19            Patient chooses bed at: [facility]) Patient to be transferred to facility by: [place] Name of family member notified: son Doren Custard Patient and family notified of of transfer: 08/04/19  Discharge Plan and Services In-house Referral: Clinical Social Work   Post Acute Care Choice: [place]                               Social Determinants of Health (SDOH) Interventions     Readmission Risk Interventions No flowsheet data found.

## 2019-08-04 NOTE — TOC Progression Note (Signed)
Transition of Care [facility]) - Progression Note    Patient Details  Name: Tammy Barron MRN: [id number] Date of Birth: [birth year]  Transition of Care [facility]) CM/SW Contact  Yordy Matton, Juliann Pulse, RN Phone Number: 08/04/2019, 1:22 PM  Clinical Narrative: [facility] [phone] 08-02-09/6-update to [place] fax#[phone].      Expected Discharge Plan: [place] Barriers to Discharge: No Barriers Identified  Expected Discharge Plan and Services Expected Discharge Plan: [place] In-house Referral: Clinical Social Work   Post Acute Care Choice: [place] Living arrangements for the past 2 months: Single Family Home Expected Discharge Date: 08/04/19                                     Social Determinants of Health (SDOH) Interventions    Readmission Risk Interventions No flowsheet data found.

## 2019-08-04 NOTE — Discharge Summary (Addendum)
Physician Discharge Summary  Tammy Barron QQI:[id number] DOB: [birth year] DOA: 07/31/2019  PCP: Caren Macadam, MD  Admit date: 07/31/2019 Discharge date: 08/04/2019  Admitted From: Home Disposition: Skilled nursing facility  Recommendations for Outpatient Follow-up:  1. Follow up with PCP in 1-2 weeks 2.   Home Health: Not applicable Equipment/Devices: Not applicable  Discharge Condition: Stable CODE STATUS: Full code Diet recommendation: regular diet   Brief/Interim Summary: 78 year old female with history of A. fib, peripheral artery disease, hypertension, hyperlipidemia, history of stroke and anxiety admitted to the hospital with pelvic fracture, left inferior pubic rami fracture with no complications, severe pain and inability to ambulate.  Also developed some wheezing on presentation.   Discharge Diagnoses:  Principal Problem:   Closed fracture of pubic ramus, left, initial encounter [facility]) Active Problems:   Dyslipidemia   Essential hypertension   AF (paroxysmal atrial fibrillation) (HCC)   Peripheral artery disease ([place])   Fall   H/O: CVA (cerebrovascular accident)   CKD (chronic kidney disease) stage 2, GFR 60-89 ml/min   mild Hyponatremia   Chronic anticoagulation   Closed stable fracture of multiple pubic rami (HCC)  Closed fracture of the pubic ramus, left inferior rami with pain and difficulty ambulation: Currently stabilizing.   Orthopedic recommended conservative management and mobility. Pain medications, Tylenol 1 g every 6 hours, tramadol 50 mg every 6 hours as needed for severe pain. Continue to work with PT OT.  Weightbearing as tolerated. DVT prophylaxis, patient is already on Pradaxa that she will continue.  Hypertension: Blood pressures are fairly stable.  Home medications were resumed.  History of stroke and paroxysmal A. fib: Currently sinus rhythm.  No new neurological deficit.  She is on Pradaxa that was continued.  Rate  controlled.  Chronic cough with wheezing: Probably has a smoker's cough.  Treated with nebulizer with improvement of symptoms.  She has low-grade temperature recorded in the hospital, WBC count is normal.  No localizing evidence of infection.  COVID-19 was negative.   Patient clinically stable.  She is stable to transfer to skilled level of care today to continue inpatient therapies.  Discharge Instructions  Discharge Instructions    Call MD for:  severe uncontrolled pain   Complete by: As directed    Diet - low sodium heart healthy   Complete by: As directed    Increase activity slowly   Complete by: As directed      Allergies as of 08/04/2019      Reactions   Latex Rash      Medication List    STOP taking these medications   diclofenac sodium 1 % Gel Commonly known as: VOLTAREN     TAKE these medications   acetaminophen 500 MG tablet Commonly known as: TYLENOL Take 1,000 mg by mouth every 6 (six) hours as needed for mild pain, moderate pain or headache.   atorvastatin 10 MG tablet Commonly known as: LIPITOR TAKE 1 TABLET BY MOUTH EVERY DAY AT 6 PM What changed:   how much to take  how to take this  when to take this  additional instructions   bisoprolol-hydrochlorothiazide 10-6.25 MG tablet Commonly known as: ZIAC Take 1 tablet by mouth daily.   clorazepate 3.75 MG tablet Commonly known as: TRANXENE TAKE 1/2 TO 1 TABLET BY MOUTH TWICE A DAY AS NEEDED FOR SEVERE ANXIETY What changed: See the new instructions.   dabigatran 150 MG Caps capsule Commonly known as: Pradaxa Take 1 capsule (150 mg total) by mouth 2 (two)  times daily.   multivitamin tablet Take 1 tablet by mouth daily.   polyethylene glycol 17 g packet Commonly known as: MIRALAX / GLYCOLAX Take 17 g by mouth daily. For constipation   traMADol 50 MG tablet Commonly known as: ULTRAM Take 1 tablet (50 mg total) by mouth every 6 (six) hours as needed for up to 5 days for moderate pain or  severe pain.      Contact information for after-discharge care    Destination    [facility] Preferred SNF .   Service: Skilled Nursing Contact information: [address] [place] [address] [phone]             Allergies  Allergen Reactions  . Latex Rash    Consultations:  None   Procedures/Studies: Dg Chest 2 View  Result Date: 07/31/2019 CLINICAL DATA:  Post fall with left hip pain. EXAM: CHEST - 2 VIEW COMPARISON:  Radiograph 02/06/2014 FINDINGS: The cardiomediastinal contours are normal. Atherosclerosis of the aortic arch. Pulmonary vasculature is normal. No consolidation, pleural effusion, or pneumothorax. No acute osseous abnormalities are seen. IMPRESSION: 1. No acute abnormality. 2.  Aortic Atherosclerosis (ICD10-I70.0). Electronically Signed   By: Narda Rutherford M.D.   On: 07/31/2019 22:10   Ct Hip Left Wo Contrast  Result Date: 07/31/2019 CLINICAL DATA:  78 year old female status post fall with left hip pain. EXAM: CT OF THE LEFT HIP WITHOUT CONTRAST TECHNIQUE: Multidetector CT imaging of the left hip was performed according to the standard protocol. Multiplanar CT image reconstructions were also generated. COMPARISON:  Left hip x-rays 2200 hours today. FINDINGS: Diverticulosis of the sigmoid colon, normal diminutive left ovary, small pelvic phleboliths and iliac artery calcified atherosclerosis in the visible pelvis. No pelvic free fluid. Intact visible left sacral ala and SI joint. Oblique minimally displaced fracture of the left inferior pubic ramus on series 2, image 64. The pubic symphysis, left superior ramus, left acetabulum and iliac wing remain intact. The proximal left femur is intact. There is some subcutaneous soft tissue contusion/hematoma overlying the left greater trochanter on series 3, image 66. No other superficial soft tissue injury identified. Calcified plaque continues in the left femoral artery. No left  inguinal lymphadenopathy. IMPRESSION: 1. Minimally displaced left inferior pubic ramus fracture. 2. No other acute fracture identified about the left hip. There is a mild soft tissue contusion/hematoma overlying the left greater trochanter. 3. Sigmoid diverticulosis. Electronically Signed   By: Odessa Fleming M.D.   On: 07/31/2019 23:03   Dg Chest Port 1 View  Result Date: 08/01/2019 CLINICAL DATA:  78 y.o female. SOB. Anticipated discharge. EXAM: PORTABLE CHEST 1 VIEW COMPARISON:  None. FINDINGS: Normal mediastinum and cardiac silhouette. Normal pulmonary vasculature. No evidence of effusion, infiltrate, or pneumothorax. No acute bony abnormality. IMPRESSION: No acute cardiopulmonary process. Electronically Signed   By: Genevive Bi M.D.   On: 08/01/2019 19:04   Dg Hip Unilat W Or Wo Pelvis 2-3 Views Left  Result Date: 07/31/2019 CLINICAL DATA:  Post fall today with left hip pain. EXAM: DG HIP (WITH OR WITHOUT PELVIS) 2-3V LEFT COMPARISON:  None. FINDINGS: The cortical margins of the bony pelvis and left hip are intact. No fracture. Pubic symphysis and sacroiliac joints are congruent. Both femoral heads are well-seated in the respective acetabula. IMPRESSION: No visualized fracture of the pelvis or left hip. Electronically Signed   By: Narda Rutherford M.D.   On: 07/31/2019 22:09       Subjective: Patient seen and examined.  No overnight events.  Still has some pain on ambulation, however tolerable.   Discharge Exam: Vitals:   08/03/19 2135 08/04/19 0442  BP:  (!) 159/62  Pulse:  71  Resp:  18  Temp:  98.6 F (37 C)  SpO2: 93% 90%   Vitals:   08/03/19 0719 08/03/19 1327 08/03/19 2135 08/04/19 0442  BP:  (!) 143/80  (!) 159/62  Pulse:  69  71  Resp:  17  18  Temp:  97.8 F (36.6 C)  98.6 F (37 C)  TempSrc:  Oral  Oral  SpO2: 90% (!) 89% 93% 90%  Weight:      Height:        General: Pt is alert, awake, not in acute distress Cardiovascular: RRR, S1/S2 +, no rubs, no  gallops Respiratory: CTA bilaterally, no wheezing, no rhonchi Abdominal: Soft, NT, ND, bowel sounds + Extremities: no edema, no cyanosis    The results of significant diagnostics from this hospitalization (including imaging, microbiology, ancillary and laboratory) are listed below for reference.     Microbiology: Recent Results (from the past 240 hour(s))  SARS CORONAVIRUS 2 (TAT 6-24 HRS) Nasopharyngeal Nasopharyngeal Swab     Status: None   Collection Time: 08/01/19 12:01 AM   Specimen: Nasopharyngeal Swab  Result Value Ref Range Status   SARS Coronavirus 2 NEGATIVE NEGATIVE Final    Comment: (NOTE) SARS-CoV-2 target nucleic acids are NOT DETECTED. The SARS-CoV-2 RNA is generally detectable in upper and lower respiratory specimens during the acute phase of infection. Negative results do not preclude SARS-CoV-2 infection, do not rule out co-infections with other pathogens, and should not be used as the sole basis for treatment or other patient management decisions. Negative results must be combined with clinical observations, patient history, and epidemiological information. The expected result is Negative. Fact Sheet for Patients: HairSlick.nohttps://www.fda.gov/media/138098/download Fact Sheet for Healthcare Providers: quierodirigir.comhttps://www.fda.gov/media/138095/download This test is not yet approved or cleared by the [place] FDA and  has been authorized for detection and/or diagnosis of SARS-CoV-2 by FDA under an Emergency Use Authorization (EUA). This EUA will remain  in effect (meaning this test can be used) for the duration of the COVID-19 declaration under Section 56 4(b)(1) of the Act, 21 U.S.C. section 360bbb-3(b)(1), unless the authorization is terminated or revoked sooner. Performed at [facility], [address] [place], [place] [address]      Labs: BNP (last 3 results) Recent Labs    08/01/19 1902  BNP 65.0   Basic Metabolic Panel: Recent Labs  Lab  07/31/19 2212 08/02/19 0548  NA 134* 132*  K 4.3 3.3*  CL 100 100  CO2 24 24  GLUCOSE 130* 121*  BUN 21 15  CREATININE 1.07* 0.73  CALCIUM 9.4 8.5*   Liver Function Tests: Recent Labs  Lab 08/02/19 0548  AST 26  ALT 16  ALKPHOS 38  BILITOT 0.9  PROT 6.2*  ALBUMIN 3.3*   No results for input(s): LIPASE, AMYLASE in the last 168 hours. No results for input(s): AMMONIA in the last 168 hours. CBC: Recent Labs  Lab 07/31/19 2212 08/02/19 0548  WBC 11.0* 9.0  NEUTROABS 9.2*  --   HGB 14.5 12.4  HCT 43.7 38.7  MCV 91.6 95.6  PLT 160 127*   Cardiac Enzymes: No results for input(s): CKTOTAL, CKMB, CKMBINDEX, TROPONINI in the last 168 hours. BNP: Invalid input(s): POCBNP CBG: No results for input(s): GLUCAP in the last 168 hours. D-Dimer No results for input(s): DDIMER in the  last 72 hours. Hgb A1c No results for input(s): HGBA1C in the last 72 hours. Lipid Profile No results for input(s): CHOL, HDL, LDLCALC, TRIG, CHOLHDL, LDLDIRECT in the last 72 hours. Thyroid function studies No results for input(s): TSH, T4TOTAL, T3FREE, THYROIDAB in the last 72 hours.  Invalid input(s): FREET3 Anemia work up No results for input(s): VITAMINB12, FOLATE, FERRITIN, TIBC, IRON, RETICCTPCT in the last 72 hours. Urinalysis    Component Value Date/Time   COLORURINE YELLOW 02/08/2014 2040   APPEARANCEUR CLEAR 02/08/2014 2040   LABSPEC 1.011 02/08/2014 2040   PHURINE 7.0 02/08/2014 2040   GLUCOSEU NEGATIVE 02/08/2014 2040   HGBUR NEGATIVE 02/08/2014 2040   HGBUR trace-intact 11/10/2007 0929   BILIRUBINUR NEGATIVE 02/08/2014 2040   KETONESUR NEGATIVE 02/08/2014 2040   PROTEINUR NEGATIVE 02/08/2014 2040   UROBILINOGEN 1.0 02/08/2014 2040   NITRITE NEGATIVE 02/08/2014 2040   LEUKOCYTESUR NEGATIVE 02/08/2014 2040   Sepsis Labs Invalid input(s): PROCALCITONIN,  WBC,  LACTICIDVEN Microbiology Recent Results (from the past 240 hour(s))  SARS CORONAVIRUS 2 (TAT 6-24 HRS)  Nasopharyngeal Nasopharyngeal Swab     Status: None   Collection Time: 08/01/19 12:01 AM   Specimen: Nasopharyngeal Swab  Result Value Ref Range Status   SARS Coronavirus 2 NEGATIVE NEGATIVE Final    Comment: (NOTE) SARS-CoV-2 target nucleic acids are NOT DETECTED. The SARS-CoV-2 RNA is generally detectable in upper and lower respiratory specimens during the acute phase of infection. Negative results do not preclude SARS-CoV-2 infection, do not rule out co-infections with other pathogens, and should not be used as the sole basis for treatment or other patient management decisions. Negative results must be combined with clinical observations, patient history, and epidemiological information. The expected result is Negative. Fact Sheet for Patients: HairSlick.no Fact Sheet for Healthcare Providers: quierodirigir.com This test is not yet approved or cleared by the [place] FDA and  has been authorized for detection and/or diagnosis of SARS-CoV-2 by FDA under an Emergency Use Authorization (EUA). This EUA will remain  in effect (meaning this test can be used) for the duration of the COVID-19 declaration under Section 56 4(b)(1) of the Act, 21 U.S.C. section 360bbb-3(b)(1), unless the authorization is terminated or revoked sooner. Performed at [facility], [address] [place], [place] [address]      Time coordinating discharge:  32 minutes  SIGNED:   Dorcas Carrow, MD  [facility] 08/04/2019, 10:56 AM

## 2019-08-07 ENCOUNTER — Ambulatory Visit: Payer: Medicare Other | Admitting: Cardiology

## 2019-08-10 ENCOUNTER — Other Ambulatory Visit: Payer: Medicare Other

## 2019-08-15 ENCOUNTER — Other Ambulatory Visit: Payer: Self-pay | Admitting: Family Medicine

## 2019-08-15 DIAGNOSIS — I10 Essential (primary) hypertension: Secondary | ICD-10-CM

## 2019-08-31 ENCOUNTER — Telehealth: Payer: Self-pay | Admitting: Family Medicine

## 2019-08-31 NOTE — Telephone Encounter (Signed)
Home Health Verbal Orders - Caller/Agency: Pamala Hurry with [facility] Number: [phone], OK to leave message Requesting OT/PT/Skilled Nursing/Social Work/Speech Therapy: home health aid, CNA Frequency: 2x a week for 3 weeks

## 2019-09-01 ENCOUNTER — Telehealth: Payer: Self-pay | Admitting: Family Medicine

## 2019-09-01 NOTE — Telephone Encounter (Signed)
I called Tammy Barron and informed him of the verbal orders as below.  Tammy Barron stated each person has to call as requested for each order and Dr Ethlyn Gallery was informed of this.

## 2019-09-01 NOTE — Telephone Encounter (Signed)
Okay 

## 2019-09-01 NOTE — Telephone Encounter (Signed)
I called Pamala Hurry and informed her of the verbal orders as below.

## 2019-09-01 NOTE — Telephone Encounter (Signed)
Home Health Verbal Orders - Caller/Agency: Laurey Arrow ([facility]--PT) Callback Number: [phone] Requesting OT/PT/Skilled Nursing/Social Work/Speech Therapy: PT Frequency: 2x3wk, 1x2wk (5 additional weeks total)

## 2019-09-01 NOTE — Telephone Encounter (Signed)
I called Laurey Arrow and informed him of the verbal orders as below.

## 2019-09-01 NOTE — Telephone Encounter (Signed)
ok 

## 2019-09-01 NOTE — Telephone Encounter (Signed)
This is the third time I have seen this message today?  I am ok with these orders

## 2019-09-01 NOTE — Telephone Encounter (Signed)
Home Health Verbal Orders - Caller/Agency:  Will With [facility] Callback Number: [phone] Requesting OT/PT/Skilled Nursing/Social Work/Speech Therapy: Needing [place] OT   Frequency: 2 week 3 stating next week

## 2019-09-06 ENCOUNTER — Ambulatory Visit: Payer: Medicare Other | Admitting: Cardiology

## 2019-09-12 ENCOUNTER — Telehealth: Payer: Self-pay

## 2019-09-15 ENCOUNTER — Telehealth: Payer: Self-pay | Admitting: *Deleted

## 2019-09-15 ENCOUNTER — Telehealth: Payer: Self-pay | Admitting: Family Medicine

## 2019-09-15 ENCOUNTER — Other Ambulatory Visit: Payer: Self-pay | Admitting: Family Medicine

## 2019-09-15 MED ORDER — CLORAZEPATE DIPOTASSIUM 3.75 MG PO TABS: ORAL_TABLET | ORAL | 0 refills | Status: DC

## 2019-09-15 NOTE — Telephone Encounter (Signed)
Sent!

## 2019-09-15 NOTE — Telephone Encounter (Signed)
Copied from [place] [id number]. Topic: Quick Communication - Home Health Verbal Orders >> Sep 15, 2019 12:21 PM Rainey Pines A wrote: Danae Chen from [facility] is requesting verbal order for social worker for resources. Best contact is [phone]

## 2019-09-15 NOTE — Telephone Encounter (Signed)
I left a detailed message on Tammy Barron's voicemail with the orders as below.

## 2019-09-15 NOTE — Telephone Encounter (Signed)
Patient requesting clorazepate (TRANXENE) 3.75 MG tablet not on current medication list

## 2019-09-15 NOTE — Telephone Encounter (Signed)
Copied from [place] [id number]. Topic: Quick Communication - Rx Refill/Question >> Sep 15, 2019  2:58 PM Rainey Pines A wrote: Medication: clorazepate (TRANXENE) 3.75 MG tablet  Has the patient contacted their pharmacy? Yes (Agent: If no, request that the patient contact the pharmacy for the refill.) (Agent: If yes, when and what did the pharmacy advise?)Contact PCP  Preferred Pharmacy (with phone number or street name): [id number] [address], [place] [phone] (Phone) [phone] (Fax)    Agent: Please be advised that RX refills may take up to 3 business days. We ask that you follow-up with your pharmacy.

## 2019-09-15 NOTE — Telephone Encounter (Signed)
ok 

## 2019-09-18 ENCOUNTER — Telehealth: Payer: Self-pay | Admitting: Family Medicine

## 2019-09-18 NOTE — Telephone Encounter (Signed)
[facility] IS CALLING AND SHE IS A BATH AID AND PT HAS BEEN CANCELLING HER APPT. OCTHERAPY DOES BATHS ALSO... pT IS GETTING 2 BATHS A DAY SO  [phone] KIMBERLY VERBAL CALL BACK TO DISCONTINUE [place]

## 2019-09-19 NOTE — Telephone Encounter (Signed)
Ok. As long as patient is getting the help she needs; probably doesn't need 2 baths daily.

## 2019-09-19 NOTE — Telephone Encounter (Signed)
I called Tammy Barron and informed her of the message below.

## 2019-09-22 ENCOUNTER — Other Ambulatory Visit: Payer: Self-pay

## 2019-09-22 ENCOUNTER — Telehealth: Payer: Self-pay | Admitting: *Deleted

## 2019-09-22 ENCOUNTER — Encounter: Payer: Self-pay | Admitting: Family Medicine

## 2019-09-22 ENCOUNTER — Telehealth (INDEPENDENT_AMBULATORY_CARE_PROVIDER_SITE_OTHER): Payer: Medicare Other | Admitting: Family Medicine

## 2019-09-22 DIAGNOSIS — S32592A Other specified fracture of left pubis, initial encounter for closed fracture: Secondary | ICD-10-CM | POA: Diagnosis not present

## 2019-09-22 DIAGNOSIS — I10 Essential (primary) hypertension: Secondary | ICD-10-CM

## 2019-09-22 DIAGNOSIS — F419 Anxiety disorder, unspecified: Secondary | ICD-10-CM

## 2019-09-22 DIAGNOSIS — N182 Chronic kidney disease, stage 2 (mild): Secondary | ICD-10-CM

## 2019-09-22 DIAGNOSIS — I480 Paroxysmal atrial fibrillation: Secondary | ICD-10-CM

## 2019-09-22 NOTE — Addendum Note (Signed)
Addended by: Caren Macadam on: 09/22/2019 02:32 PM   Modules accepted: Level of Service

## 2019-09-22 NOTE — Progress Notes (Addendum)
Virtual Visit via Telephone Note  I connected with Tammy Barron on 09/22/19 at  1:30 PM EST  by telephone and verified that I am speaking with the correct person using two identifiers.   I discussed the limitations, risks, security and privacy concerns of performing an evaluation and management service by telephone and the availability of in person appointments. I also discussed with the patient that there may be a patient responsible charge related to this service. The patient expressed understanding and agreed to proceed.  Location patient: home Location provider: work office Participants present for the call: patient, provider Patient did not have a visit in the prior 7 days to address this/these issue(s).  Tammy Barron DOB: December 29, 1940 Encounter date: 09/22/2019  This is a 78 y.o. female who presents with Chief Complaint  Patient presents with  . Follow-up    History of present illness: She is currently in last full week of PT/OT after fall. Transitioning to once a week PT. She is going up and down steps and walking outside; using walker mainly. She is at home now.   Doesn't have any pain, but has had some arthritis in left knee for years. Sometimes this acts up. Was given tramadol for pain q 6 hours. Still has some of those left.   Frustrated with not being able to drive, and go to store. Everything she needs is all within block of house.   Blood pressures are averaging 120-140/70-80. No issues with low blood pressures.   When she was in the care center, a couple of nights was waking up frequently to help with voiding. NO urinary infection. Started on oxybutynin 5mg  qhs. Hasn't taken this regularly. Before she fell she was going 1-2 x/night. No issues with urinary leaking during the day.   Feels like she would have a nervous breakdown if she didn't have the clorazepate. Feels that this truly works well for her. Sometimes daily but does have days without taking it. Today  had OT supervisor today come out. He was asking her about OT care. He was defending OT's job; this got her very frustrated.   Allergies  Allergen Reactions  . Latex Rash   Current Meds  Medication Sig  . acetaminophen (TYLENOL) 500 MG tablet Take 500 mg by mouth every 6 (six) hours as needed for mild pain, moderate pain or headache.   atorvastatin (LIPITOR) 10 MG tablet TAKE 1 TABLET BY MOUTH EVERY DAY AT 6 PM  . bisoprolol-hydrochlorothiazide (ZIAC) 10-6.25 MG tablet TAKE 1 TABLET BY MOUTH EVERY DAY  . clorazepate (TRANXENE) 3.75 MG tablet TAKE 1/2-1 TABLET BY MOUTH TWICE A DAY AS NEEDED for severe anxiety  . dabigatran (PRADAXA) 150 MG CAPS capsule Take 1 capsule (150 mg total) by mouth 2 (two) times daily.  . Multiple Vitamin (MULTIVITAMIN) tablet Take 1 tablet by mouth daily.  . Sennosides (SENOKOT PO) Take by mouth as needed.  . [DISCONTINUED] polyethylene glycol (MIRALAX / GLYCOLAX) packet Take 17 g by mouth daily. For constipation    Review of Systems  Constitutional: Negative for chills, fatigue and fever.  Respiratory: Negative for cough, chest tightness, shortness of breath and wheezing.   Cardiovascular: Negative for chest pain, palpitations and leg swelling.    Objective:  There were no vitals taken for this visit.      BP Readings from Last 3 Encounters:  08/04/19 132/67  01/09/19 130/72  10/05/18 130/64   Wt Readings from Last 3 Encounters:  07/31/19 170 lb (77.1  kg)  01/09/19 178 lb 8 oz (81 kg)  10/05/18 178 lb (80.7 kg)   Observations/Objective: Patient sounds cheerful and well on the phone. I do not appreciate any SOB. Speech and thought processing are grossly intact. Patient reported vitals:see above  Assessment/Plan  1. Essential hypertension Has been stable. Continue to monitor at home.   2. Closed fracture of pubic ramus, left, initial encounter ([place]) Doing well with ambulation and activity. Working with PT regularly. Walking regularly at  home.   3. AF (paroxysmal atrial fibrillation) ([place]) On pradaxa.  4. CKD (chronic kidney disease) stage 2, GFR 60-89 ml/min Has been stable. Plan for recheck bloodwork at next visit.  5. Anxiety Stable. Uses the clorazepate sparingly. We discussed trying to limit use, which she does. We discussed risks of medication.   I did not refer this patient for an OV in the next 24 hours for this/these issue(s).  I discussed the assessment and treatment plan with the patient. The patient was provided an opportunity to ask questions and all were answered. The patient agreed with the plan and demonstrated an understanding of the instructions.   The patient was advised to call back or seek an in-person evaluation if the symptoms worsen or if the condition fails to improve as anticipated.  I provided 20 minutes of non-face-to-face time during this encounter.    Return in about 3 months (around 12/23/2019) for Chronic condition visit.    Micheline Rough, MD

## 2019-09-22 NOTE — Telephone Encounter (Signed)
-----   Message from Caren Macadam, MD sent at 09/22/2019  2:02 PM EST ----- Follow up visit in office in 3 months

## 2019-09-22 NOTE — Telephone Encounter (Signed)
Left a detailed message at the pts home number to schedule an in office appt as below.

## 2019-10-17 ENCOUNTER — Telehealth: Payer: Self-pay | Admitting: *Deleted

## 2019-10-17 NOTE — Telephone Encounter (Signed)
Copied from [place] [id number]. Topic: General - Other >> Oct 17, 2019 11:15 AM Yvette Rack wrote: Reason for CRM: Chantel with [facility] called to see if the orders that she faxed over on 10/09/19 was received. Cb# [phone]

## 2019-10-18 NOTE — Telephone Encounter (Signed)
I believe I signed some orders today for them on ms. Julien Girt. I still have a stack on my desk that I will finish today; so if you didn't see them yet, hold this message until end of day to check. Thanks!

## 2019-10-18 NOTE — Telephone Encounter (Signed)
Folder with additional forms was received today and placed on your desk.

## 2019-10-18 NOTE — Telephone Encounter (Signed)
Forms faxed to [phone] and sent to be scanned.

## 2019-10-18 NOTE — Telephone Encounter (Signed)
Signed and returned to your desk. 

## 2019-10-30 ENCOUNTER — Other Ambulatory Visit: Payer: Self-pay | Admitting: Family Medicine

## 2019-11-07 ENCOUNTER — Telehealth: Payer: Self-pay

## 2019-11-07 NOTE — Telephone Encounter (Signed)
Copied from CRM [id number] [phone]. Topic: General - Inquiry >> Nov 07, 2019 12:36 PM Reggie Pile, NT wrote: Reason for CRM: Pt called in stating she is being summoned and would like to have a note from PCP stating she cannot go. Pt states she also has a POA form that she will be dropping off. Finally pt is wondering if PCP can write a prescription for a new front wheel walker as hers is not assisting anymore. Please advise.

## 2019-11-08 ENCOUNTER — Encounter: Payer: Self-pay | Admitting: *Deleted

## 2019-11-08 DIAGNOSIS — S32592A Other specified fracture of left pubis, initial encounter for closed fracture: Secondary | ICD-10-CM

## 2019-11-08 NOTE — Telephone Encounter (Signed)
Ok for note - due to risk of COVID and physical limitations from fall/recent pelvic fracture.   We can get the POA formed scanned in once she drops it off.   OK for rx for front wheel walker, but I'm not sure about insurance limitations for filing for this if she has already gotten one through insurance? Use dx of pelvic fracture for this. If hers is broken we can specify that on the rx.

## 2019-11-08 NOTE — Telephone Encounter (Signed)
I called the pt, left a detailed message with the information below at her home number and stated the letter and Rx for the walker were left at the front desk for pick up.

## 2019-11-16 ENCOUNTER — Telehealth: Payer: Self-pay | Admitting: Family Medicine

## 2019-11-16 NOTE — Telephone Encounter (Signed)
Message Routed to PCP CMA 

## 2019-11-16 NOTE — Telephone Encounter (Signed)
DME order printed and faxed along with demographics sheet to [employer] at [phone].

## 2019-11-16 NOTE — Telephone Encounter (Signed)
Pt got order for walker from dr Melton Krebs on 11-08-2019 and the order need to be fax to [facility] fax number [phone]. Pt does have hard copy and she is unable to fax. Please send demographic and insurance info

## 2019-11-21 ENCOUNTER — Other Ambulatory Visit: Payer: Medicare Other

## 2019-11-30 ENCOUNTER — Ambulatory Visit (INDEPENDENT_AMBULATORY_CARE_PROVIDER_SITE_OTHER): Payer: Medicare PPO

## 2019-11-30 VITALS — BP 130/80 | Ht 64.0 in | Wt 174.0 lb

## 2019-11-30 DIAGNOSIS — Z0000 Encounter for general adult medical examination without abnormal findings: Secondary | ICD-10-CM

## 2019-11-30 NOTE — Progress Notes (Addendum)
This visit is being conducted via phone call due to the COVID-19 pandemic. This patient has given me verbal consent via phone to conduct this visit, patient states they are participating from their home address. Some vital signs may be absent or patient reported.   Patient identification: identified by name, DOB, and current address.  Location provider: [facility], Office Persons participating in the virtual visit: Tammy Barron and Nathaniel Man, LPN.    Subjective:   Tammy Barron is a 79 y.o. female who presents for Medicare Annual (Subsequent) preventive examination.  Tammy Barron is doing well at this time. She is not getting any physical exercise and is eating 3 meals/day. She reports getting up 2-3 times during the night to urinate. She is currently smoking 4-5 cigarettes daily and states she can't quit right now during the pandemic.  Review of Systems:         Objective:     Vitals: BP 130/80   Ht 5\' 4"  (1.626 m)   Wt 174 lb (78.9 kg)   BMI 29.87 kg/m   Body mass index is 29.87 kg/m.  Advanced Directives 11/30/2019 08/01/2019 10/05/2018 06/10/2017 12/10/2016 04/09/2016 10/03/2015  Does Patient Have a Medical Advance Directive? Yes Yes Yes No Yes Yes Yes  Type of Advance Directive Living will;Healthcare Power of 14/11/2014 Power of [place];Living will - - Healthcare Power of [place];Living will Healthcare Power of [place] Power of Attorney  Does patient want to make changes to medical advance directive? No - Patient declined No - Patient declined - - - No - Patient declined No - Patient declined  Copy of Healthcare Power of Attorney in Chart? No - copy requested No - copy requested - - - No - copy requested No - copy requested  Would patient like information on creating a medical advance directive? - No - Patient declined - No - Patient declined - - -  Pre-existing out of facility DNR order (yellow form or pink MOST form) - - - - - - -    Tobacco  Social History   Tobacco Use  Smoking Status Current Some Day Smoker  . Packs/day: 0.25  . Years: 40.00  . Pack years: 10.00  . Types: Cigarettes  . Last attempt to quit: 11/10/2018  . Years since quitting: 1.0  Smokeless Tobacco Never Used  Tobacco Comment   3-4 per day     Ready to quit: Not Answered Counseling given: Not Answered Comment: 3-4 per day   Clinical Intake:                       Past Medical History:  Diagnosis Date  . Anemia   . Anxiety   . Atrial fibrillation (HCC)   . Carotid stenosis, right 01/09/2019  . Depression   . Hx of abnormal Pap smear   . Hyperlipidemia   . Hypertension   . Peripheral artery disease (HCC) 01/09/2019   Past Surgical History:  Procedure Laterality Date  . ABDOMINAL HYSTERECTOMY  1982  . ANKLE ARTHODESIS W/ ARTHROSCOPY     Family History  Problem Relation Age of Onset  . Heart disease Mother   . Heart attack Father   . Cancer Sister   . Hypertension Maternal Aunt    Social History   Socioeconomic History  . Marital status: Widowed    Spouse name: Not on file  . Number of children: 1  . Years of education: Not on file  . Highest  education level: Not on file  Occupational History  . Not on file  Tobacco Use  . Smoking status: Current Some Day Smoker    Packs/day: 0.25    Years: 40.00    Pack years: 10.00    Types: Cigarettes    Last attempt to quit: 11/10/2018    Years since quitting: 1.0  . Smokeless tobacco: Never Used  . Tobacco comment: 3-4 per day  Substance and Sexual Activity  . Alcohol use: No    Alcohol/week: 0.0 standard drinks  . Drug use: Never  . Sexual activity: Not on file  Other Topics Concern  . Not on file  Social History Narrative   HH 1   2 children out of state   2 grandchildren   Brother, niece, & nephew live locally   Social Determinants of Health   Financial Resource Strain: Low Risk   . Difficulty of Paying Living Expenses: Not hard at all  Food Insecurity: No Food  Insecurity  . Worried About Charity fundraiser in the Last Year: Never true  . Ran Out of Food in the Last Year: Never true  Transportation Needs: No Transportation Needs  . Lack of Transportation (Medical): No  . Lack of Transportation (Non-Medical): No  Physical Activity: Inactive  . Days of Exercise per Week: 0 days  . Minutes of Exercise per Session: 0 min  Stress:   . Feeling of Stress : Not on file  Social Connections: Somewhat Isolated  . Frequency of Communication with Friends and Family: More than three times a week  . Frequency of Social Gatherings with Friends and Family: Never  . Attends Religious Services: Never  . Active Member of Clubs or Organizations: Yes  . Attends Archivist Meetings: Never  . Marital Status: Widowed    Outpatient Encounter Medications as of 11/30/2019  Medication Sig  . acetaminophen (TYLENOL) 500 MG tablet Take 500 mg by mouth every 6 (six) hours as needed for mild pain, moderate pain or headache.   Marland Kitchen atorvastatin (LIPITOR) 10 MG tablet TAKE 1 TABLET BY MOUTH EVERY DAY AT 6 PM  . bisoprolol-hydrochlorothiazide (ZIAC) 10-6.25 MG tablet TAKE 1 TABLET BY MOUTH EVERY DAY  . clorazepate (TRANXENE) 3.75 MG tablet TAKE 1/2-1 TABLET BY MOUTH TWICE A DAY AS NEEDED FOR SEVERE ANXIETY  . dabigatran (PRADAXA) 150 MG CAPS capsule Take 1 capsule (150 mg total) by mouth 2 (two) times daily.  . Multiple Vitamin (MULTIVITAMIN) tablet Take 1 tablet by mouth daily.  . Sennosides (SENOKOT PO) Take by mouth as needed.   No facility-administered encounter medications on file as of 11/30/2019.    Activities of Daily Living In your present state of health, do you have any difficulty performing the following activities: 11/30/2019 08/01/2019  Hearing? N N  Vision? N N  Difficulty concentrating or making decisions? N N  Walking or climbing stairs? N Y  Dressing or bathing? N Y  Doing errands, shopping? N N  Preparing Food and eating ? N -  Using the  Toilet? N -  In the past six months, have you accidently leaked urine? Y -  Do you have problems with loss of bowel control? N -  Managing your Medications? N -  Managing your Finances? N -  Housekeeping or managing your Housekeeping? N -  Some recent data might be hidden    Patient Care Team: Caren Macadam, MD as PCP - General (Family Medicine) Adrian Prows, MD as Consulting Physician (Cardiology)  Assessment:   This is a routine wellness examination for [facility].  Exercise Activities and Dietary recommendations Current Exercise Habits: The patient does not participate in regular exercise at present, Exercise limited by: None identified  Goals    . Patient Stated     To lives your life!        Fall Risk Fall Risk  11/30/2019 10/05/2018 01/05/2017 10/18/2015  Falls in the past year? 1 0 No No  Number falls in past yr: 0 - - -  Injury with Fall? 1 - - -  Risk for fall due to : History of fall(s);Medication side effect - - -  Follow up Falls evaluation completed;Education provided;Falls prevention discussed - - -   Is the patient's home free of loose throw rugs in walkways, pet beds, electrical cords, etc?   yes      Grab bars in the bathroom? yes      Handrails on the stairs?   yes      Adequate lighting?   yes  Timed Get Up and Go performed: N/A due to telephone visit  Depression Screen PHQ 2/9 Scores 11/30/2019 10/05/2018 01/05/2017 10/18/2015  PHQ - 2 Score 0 0 0 0     Cognitive Function MMSE - Mini Mental State Exam 10/05/2018  Not completed: (No Data)     6CIT Screen 11/30/2019  What Year? 0 points  What month? 0 points  What time? 0 points  Count back from 20 0 points  Months in reverse 0 points  Repeat phrase 0 points  Total Score 0    Immunization History  Administered Date(s) Administered  . Influenza Whole 08/22/2009  . Influenza, High Dose Seasonal PF 10/18/2015, 09/18/2017, 10/05/2018  . Influenza,inj,Quad PF,6+ Mos 08/23/2014  .  Influenza-Unspecified 09/02/2013, 10/05/2016, 08/03/2019  . Pneumococcal Conjugate-13 08/23/2014  . Pneumococcal Polysaccharide-23 08/06/2008, 10/08/2010    Qualifies for Shingles Vaccine?Yes  Screening Tests Health Maintenance  Topic Date Due  . TETANUS/TDAP  04/19/1960  . INFLUENZA VACCINE  Completed  . PNA vac Low Risk Adult  Completed    Cancer Screenings: Lung: Low Dose CT Chest recommended if Age 34-80 years, 30 pack-year currently smoking OR have quit w/in 15years. Patient does not qualify. Breast:  Up to date on Mammogram? Aged out   Up to date of Bone Density/Dexa? Aged out Colorectal: aged out  Additional Screenings:  Hepatitis C Screening: N/A due to age.     Plan:   Tammy Barron wants covid vaccines and will delay updating Tdap and obtaining shingrix until she receives covid vaccines. Encouraged her to be more physically active and to drink 6-8 glasses of water daily.    I have personally reviewed and noted the following in the patient's chart:   . Medical and social history . Use of alcohol, tobacco or illicit drugs  . Current medications and supplements . Functional ability and status . Nutritional status . Physical activity . Advanced directives . List of other physicians . Hospitalizations, surgeries, and ER visits in previous 12 months . Vitals . Screenings to include cognitive, depression, and falls . Referrals and appointments  In addition, I have reviewed and discussed with patient certain preventive protocols, quality metrics, and best practice recommendations. A written personalized care plan for preventive services as well as general preventive health recommendations were provided to patient.     Nathaniel Man, LPN  3/38/2505

## 2019-12-11 ENCOUNTER — Ambulatory Visit: Payer: Medicare Other | Admitting: Cardiology

## 2019-12-19 ENCOUNTER — Other Ambulatory Visit: Payer: Medicare PPO

## 2019-12-19 ENCOUNTER — Other Ambulatory Visit: Payer: Self-pay | Admitting: Family Medicine

## 2019-12-22 NOTE — Telephone Encounter (Signed)
complete

## 2020-01-09 ENCOUNTER — Ambulatory Visit: Payer: Medicare PPO | Admitting: Cardiology

## 2020-01-23 DIAGNOSIS — M6281 Muscle weakness (generalized): Secondary | ICD-10-CM | POA: Diagnosis not present

## 2020-01-23 DIAGNOSIS — Z9181 History of falling: Secondary | ICD-10-CM | POA: Diagnosis not present

## 2020-02-23 DIAGNOSIS — M6281 Muscle weakness (generalized): Secondary | ICD-10-CM | POA: Diagnosis not present

## 2020-02-23 DIAGNOSIS — Z9181 History of falling: Secondary | ICD-10-CM | POA: Diagnosis not present

## 2020-03-24 DIAGNOSIS — M6281 Muscle weakness (generalized): Secondary | ICD-10-CM | POA: Diagnosis not present

## 2020-03-24 DIAGNOSIS — Z9181 History of falling: Secondary | ICD-10-CM | POA: Diagnosis not present

## 2020-04-02 ENCOUNTER — Other Ambulatory Visit: Payer: Self-pay

## 2020-04-02 ENCOUNTER — Ambulatory Visit: Payer: Medicare PPO

## 2020-04-02 DIAGNOSIS — I6523 Occlusion and stenosis of bilateral carotid arteries: Secondary | ICD-10-CM

## 2020-04-02 DIAGNOSIS — I6521 Occlusion and stenosis of right carotid artery: Secondary | ICD-10-CM

## 2020-04-07 ENCOUNTER — Other Ambulatory Visit: Payer: Self-pay | Admitting: Cardiology

## 2020-04-07 DIAGNOSIS — I6523 Occlusion and stenosis of bilateral carotid arteries: Secondary | ICD-10-CM

## 2020-04-08 ENCOUNTER — Other Ambulatory Visit: Payer: Self-pay | Admitting: Family Medicine

## 2020-04-09 NOTE — Progress Notes (Signed)
Looks like she has cancelled about 6 appointments with me. Carotid stenosis is stable and needs continued studies which I have ordered. LDL goal around 50. Let me know  Tammy Barron [phone]

## 2020-04-24 DIAGNOSIS — M6281 Muscle weakness (generalized): Secondary | ICD-10-CM | POA: Diagnosis not present

## 2020-04-24 DIAGNOSIS — Z9181 History of falling: Secondary | ICD-10-CM | POA: Diagnosis not present

## 2020-05-22 ENCOUNTER — Telehealth: Payer: Self-pay

## 2020-05-22 ENCOUNTER — Telehealth (INDEPENDENT_AMBULATORY_CARE_PROVIDER_SITE_OTHER): Payer: Medicare PPO | Admitting: Family Medicine

## 2020-05-22 ENCOUNTER — Telehealth: Payer: Self-pay | Admitting: *Deleted

## 2020-05-22 ENCOUNTER — Other Ambulatory Visit: Payer: Self-pay

## 2020-05-22 DIAGNOSIS — I10 Essential (primary) hypertension: Secondary | ICD-10-CM | POA: Diagnosis not present

## 2020-05-22 DIAGNOSIS — I480 Paroxysmal atrial fibrillation: Secondary | ICD-10-CM | POA: Diagnosis not present

## 2020-05-22 DIAGNOSIS — E785 Hyperlipidemia, unspecified: Secondary | ICD-10-CM

## 2020-05-22 DIAGNOSIS — L989 Disorder of the skin and subcutaneous tissue, unspecified: Secondary | ICD-10-CM | POA: Diagnosis not present

## 2020-05-22 DIAGNOSIS — Z8673 Personal history of transient ischemic attack (TIA), and cerebral infarction without residual deficits: Secondary | ICD-10-CM

## 2020-05-22 MED ORDER — DABIGATRAN ETEXILATE MESYLATE 150 MG PO CAPS: 150.0000 mg | ORAL_CAPSULE | Freq: Two times a day (BID) | ORAL | 3 refills | Status: DC

## 2020-05-22 MED ORDER — BISOPROLOL-HYDROCHLOROTHIAZIDE 10-6.25 MG PO TABS: 1.0000 | ORAL_TABLET | Freq: Every day | ORAL | 1 refills | Status: DC

## 2020-05-22 MED ORDER — ATORVASTATIN CALCIUM 10 MG PO TABS: ORAL_TABLET | ORAL | 1 refills | Status: DC

## 2020-05-22 NOTE — Progress Notes (Signed)
Virtual Visit via Telephone Note  I connected with Tammy Barron  on 05/22/20 at  1:00 PM EDT by telephone and verified that I am speaking with the correct person using two identifiers.   I discussed the limitations, risks, security and privacy concerns of performing an evaluation and management service by telephone and the availability of in person appointments. I also discussed with the patient that there may be a patient responsible charge related to this service. The patient expressed understanding and agreed to proceed.  Location patient: home Location provider: work office Participants present for the call: patient, provider Patient did not have a visit in the prior 7 days to address this/these issue(s).   History of Present Illness:  Has a couple areas on top of hand - 1/4 inch calcified area. Has another area on other hand that is growing but not that big. Has several areas on other areas of body.    Also wants full skin evaluation. Has little areas that pop up over body and some areas that have been on skin for years. Worries about upper chest area around neck.   Has been dealing this for years thinking it would go away.   Doing well, just has nagging fear of falling. She is getting around fine.   Blood pressure has been good- ranging between 120-140/80-90.  Observations/Objective: Patient sounds cheerful and well on the phone. I do not appreciate any SOB. Speech and thought processing are grossly intact. Patient reported vitals: See above  Assessment and Plan: 1. Skin lesions - Ambulatory referral to Dermatology  2. Essential hypertension Well-controlled.  Continue current medication. - bisoprolol-hydrochlorothiazide (ZIAC) 10-6.25 MG tablet; Take 1 tablet by mouth daily.  Dispense: 90 tablet; Refill: 1 - CBC with Differential/Platelet; Future - Comprehensive metabolic panel; Future  3. History of cerebellar stroke - dabigatran (PRADAXA) 150 MG CAPS capsule;  Take 1 capsule (150 mg total) by mouth 2 (two) times daily.  Dispense: 180 capsule; Refill: 3  4. Dyslipidemia - atorvastatin (LIPITOR) 10 MG tablet; 1 tablet PO nightly  Dispense: 90 tablet; Refill: 1 - Lipid panel; Future  5. AF (paroxysmal atrial fibrillation) (HCC) - dabigatran (PRADAXA) 150 MG CAPS capsule; Take 1 capsule (150 mg total) by mouth 2 (two) times daily.  Dispense: 180 capsule; Refill: 3   Follow Up Instructions:  Return for Chronic condition visit.  99441 5-10 [id number] 11-20 9443 21-30 I did not refer this patient for an OV in the next 24 hours for this/these issue(s).  I discussed the assessment and treatment plan with the patient. The patient was provided an opportunity to ask questions and all were answered. The patient agreed with the plan and demonstrated an understanding of the instructions.   The patient was advised to call back or seek an in-person evaluation if the symptoms worsen or if the condition fails to improve as anticipated.  I provided 17 minutes of non-face-to-face time during this encounter.   Theodis Shove, MD

## 2020-05-22 NOTE — Telephone Encounter (Signed)
-----   Message from Wynn Banker, MD sent at 05/22/2020  1:21 PM EDT ----- She will need in office visit at time when she can also get bloodwork done. If she wants to do bloodwork ahead of time that is fine; she was concerned with skin lesions and although I am referring to dermatology, I feel like it will take some time to get in for this visit and she was interested in eval/treatment prior to visit. Visit will need to be 30 minutes

## 2020-05-22 NOTE — Telephone Encounter (Signed)
Left a detailed message at the pts home number with the information below and to call back for appts.

## 2020-05-22 NOTE — Telephone Encounter (Signed)
Patient called to get results of her Carotid duplex. I explained her results and that her results were sent to mychart patient asked me deactivate her mychart she doesn't want to use it. Mychart is deactivated. I informed her that she needs to make an appointment with Dr. Jacinto Halim, she said she will call back and make the appointment.

## 2020-05-24 DIAGNOSIS — M6281 Muscle weakness (generalized): Secondary | ICD-10-CM | POA: Diagnosis not present

## 2020-05-24 DIAGNOSIS — Z9181 History of falling: Secondary | ICD-10-CM | POA: Diagnosis not present

## 2020-05-24 NOTE — Addendum Note (Signed)
Addended by: Lerry Liner on: 05/24/2020 02:50 PM   Modules accepted: Orders

## 2020-05-29 ENCOUNTER — Other Ambulatory Visit: Payer: Medicare PPO

## 2020-06-03 ENCOUNTER — Other Ambulatory Visit: Payer: Medicare PPO

## 2020-06-03 ENCOUNTER — Other Ambulatory Visit: Payer: Self-pay

## 2020-06-03 DIAGNOSIS — I10 Essential (primary) hypertension: Secondary | ICD-10-CM

## 2020-06-03 DIAGNOSIS — E785 Hyperlipidemia, unspecified: Secondary | ICD-10-CM | POA: Diagnosis not present

## 2020-06-04 LAB — CBC WITH DIFFERENTIAL/PLATELET
Absolute Monocytes: 539 cells/uL (ref 200–950)
Basophils Absolute: 42 cells/uL (ref 0–200)
Basophils Relative: 0.6 %
Eosinophils Absolute: 238 cells/uL (ref 15–500)
Eosinophils Relative: 3.4 %
HCT: 44.7 % (ref 35.0–45.0)
Hemoglobin: 14.9 g/dL (ref 11.7–15.5)
Lymphs Abs: 1610 cells/uL (ref 850–3900)
MCH: 31 pg (ref 27.0–33.0)
MCHC: 33.3 g/dL (ref 32.0–36.0)
MCV: 92.9 fL (ref 80.0–100.0)
MPV: 12.3 fL (ref 7.5–12.5)
Monocytes Relative: 7.7 %
Neutro Abs: 4571 cells/uL (ref 1500–7800)
Neutrophils Relative %: 65.3 %
Platelets: 165 10*3/uL (ref 140–400)
RBC: 4.81 10*6/uL (ref 3.80–5.10)
RDW: 12.2 % (ref 11.0–15.0)
Total Lymphocyte: 23 %
WBC: 7 10*3/uL (ref 3.8–10.8)

## 2020-06-04 LAB — COMPREHENSIVE METABOLIC PANEL
AG Ratio: 1.4 (calc) (ref 1.0–2.5)
ALT: 12 U/L (ref 6–29)
AST: 19 U/L (ref 10–35)
Albumin: 3.8 g/dL (ref 3.6–5.1)
Alkaline phosphatase (APISO): 50 U/L (ref 37–153)
BUN/Creatinine Ratio: 18 (calc) (ref 6–22)
BUN: 17 mg/dL (ref 7–25)
CO2: 33 mmol/L — ABNORMAL HIGH (ref 20–32)
Calcium: 9.3 mg/dL (ref 8.6–10.4)
Chloride: 102 mmol/L (ref 98–110)
Creat: 0.94 mg/dL — ABNORMAL HIGH (ref 0.60–0.88)
Globulin: 2.8 g/dL (calc) (ref 1.9–3.7)
Glucose, Bld: 102 mg/dL — ABNORMAL HIGH (ref 65–99)
Potassium: 4.2 mmol/L (ref 3.5–5.3)
Sodium: 139 mmol/L (ref 135–146)
Total Bilirubin: 0.5 mg/dL (ref 0.2–1.2)
Total Protein: 6.6 g/dL (ref 6.1–8.1)

## 2020-06-04 LAB — LIPID PANEL
Cholesterol: 122 mg/dL (ref <200)
HDL: 46 mg/dL — ABNORMAL LOW (ref >=50)
LDL Cholesterol (Calc): 52 mg/dL (calc)
Non-HDL Cholesterol (Calc): 76 mg/dL (calc) (ref <130)
Total CHOL/HDL Ratio: 2.7 (calc) (ref <5.0)
Triglycerides: 165 mg/dL — ABNORMAL HIGH (ref <150)

## 2020-06-05 ENCOUNTER — Telehealth: Payer: Self-pay | Admitting: Family Medicine

## 2020-06-05 NOTE — Telephone Encounter (Signed)
Pt was returning JoAnne's call. She stated she will be gone to lunch within the next hour so it may be best to call late evening  Pt can be reached at [phone]

## 2020-06-05 NOTE — Telephone Encounter (Signed)
Left a message for the pt to return my call.  

## 2020-06-06 NOTE — Telephone Encounter (Signed)
See results note. 

## 2020-06-21 ENCOUNTER — Other Ambulatory Visit: Payer: Self-pay

## 2020-06-21 ENCOUNTER — Encounter: Payer: Self-pay | Admitting: Family Medicine

## 2020-06-21 ENCOUNTER — Ambulatory Visit: Payer: Medicare PPO | Admitting: Family Medicine

## 2020-06-21 VITALS — BP 112/80 | HR 66 | Temp 98.3°F | Wt 178.1 lb

## 2020-06-21 DIAGNOSIS — N182 Chronic kidney disease, stage 2 (mild): Secondary | ICD-10-CM | POA: Diagnosis not present

## 2020-06-21 DIAGNOSIS — E785 Hyperlipidemia, unspecified: Secondary | ICD-10-CM | POA: Diagnosis not present

## 2020-06-21 DIAGNOSIS — I10 Essential (primary) hypertension: Secondary | ICD-10-CM | POA: Diagnosis not present

## 2020-06-21 DIAGNOSIS — L821 Other seborrheic keratosis: Secondary | ICD-10-CM

## 2020-06-21 NOTE — Patient Instructions (Addendum)
Ask at pharmacy about Tdap  Adult Exercise Classes   1. [place] [employer]: [place].gov [phone]) a. All classes are free, but you have to register. Zoom classes are offered. Variety includes yoga, chair yoga, arthritis classes, Tai Chi, line dancing, balance classes, boot camp, and more!   2. [employer]  a. Daily chair exercises/Yoga classes

## 2020-06-21 NOTE — Progress Notes (Signed)
  Tammy Barron DOB: January 05, 1941 Encounter date: 06/21/2020  This is a 79 y.o. female who presents with Chief Complaint  Patient presents with  . Results  . Referral    Dermatology (has some spots all over)     History of present illness: Has spots all over - sun damage. Son who is dermatologist would like her to be evaluated.   Otherwise feeling well. No particular concerns today.   BP has been good.   Allergies  Allergen Reactions  . Latex Rash   Current Meds  Medication Sig  . acetaminophen (TYLENOL) 500 MG tablet Take 500 mg by mouth every 6 (six) hours as needed for mild pain, moderate pain or headache.   Marland Kitchen atorvastatin (LIPITOR) 10 MG tablet 1 tablet PO nightly  . bisoprolol-hydrochlorothiazide (ZIAC) 10-6.25 MG tablet Take 1 tablet by mouth daily.  . clorazepate (TRANXENE) 3.75 MG tablet TAKE 1/2-1 TABLET BY MOUTH TWICE A DAY AS NEEDED FOR SEVERE ANXIETY  . dabigatran (PRADAXA) 150 MG CAPS capsule Take 1 capsule (150 mg total) by mouth 2 (two) times daily.  . Multiple Vitamin (MULTIVITAMIN) tablet Take 1 tablet by mouth daily.  . Sennosides (SENOKOT PO) Take by mouth as needed.    Review of Systems  Constitutional: Negative for chills, fatigue and fever.  Respiratory: Negative for cough, chest tightness, shortness of breath and wheezing.   Cardiovascular: Negative for chest pain, palpitations and leg swelling.    Objective:  BP 112/80 (BP Location: Left Arm, Patient Position: Sitting, Cuff Size: Normal)   Pulse 66   Temp 98.3 F (36.8 C) (Oral)   Wt 178 lb 1.6 oz (80.8 kg)   SpO2 96%   BMI 30.57 kg/m   Weight: 178 lb 1.6 oz (80.8 kg)   BP Readings from Last 3 Encounters:  06/21/20 112/80  11/30/19 130/80  08/04/19 132/67   Wt Readings from Last 3 Encounters:  06/21/20 178 lb 1.6 oz (80.8 kg)  11/30/19 174 lb (78.9 kg)  07/31/19 170 lb (77.1 kg)    Physical Exam Constitutional:      General: She is not in acute distress.    Appearance: She is  well-developed.  Cardiovascular:     Rate and Rhythm: Normal rate and regular rhythm.     Heart sounds: Normal heart sounds. No murmur heard.  No friction rub.  Pulmonary:     Effort: Pulmonary effort is normal. No respiratory distress.     Breath sounds: Normal breath sounds. No wheezing or rales.  Musculoskeletal:     Right lower leg: No edema.     Left lower leg: No edema.  Skin:    Comments: Multiple actinic keratoses and seborrheic keratoses over chest arms legs back  Neurological:     Mental Status: She is alert and oriented to person, place, and time.  Psychiatric:        Behavior: Behavior normal.     Assessment/Plan  1. Essential hypertension Pressure well controlled.  Continue current medication.  2. CKD (chronic kidney disease) stage 2, GFR 60-89 ml/min Functions been stable.  Continue to monitor.  3. Dyslipidemia Continue Lipitor.  Cholesterol control.  4. Seborrheic keratosis We will send her to dermatology. - Ambulatory referral to Dermatology   Return in about 6 months (around 12/22/2020) for physical exam.    Theodis Shove, MD

## 2020-06-24 DIAGNOSIS — Z9181 History of falling: Secondary | ICD-10-CM | POA: Diagnosis not present

## 2020-06-24 DIAGNOSIS — M6281 Muscle weakness (generalized): Secondary | ICD-10-CM | POA: Diagnosis not present

## 2020-07-21 ENCOUNTER — Other Ambulatory Visit: Payer: Self-pay | Admitting: Family Medicine

## 2020-07-25 DIAGNOSIS — Z9181 History of falling: Secondary | ICD-10-CM | POA: Diagnosis not present

## 2020-07-25 DIAGNOSIS — M6281 Muscle weakness (generalized): Secondary | ICD-10-CM | POA: Diagnosis not present

## 2020-08-24 DIAGNOSIS — Z9181 History of falling: Secondary | ICD-10-CM | POA: Diagnosis not present

## 2020-08-24 DIAGNOSIS — M6281 Muscle weakness (generalized): Secondary | ICD-10-CM | POA: Diagnosis not present

## 2020-09-05 ENCOUNTER — Other Ambulatory Visit: Payer: Self-pay | Admitting: Family Medicine

## 2020-09-05 DIAGNOSIS — E785 Hyperlipidemia, unspecified: Secondary | ICD-10-CM

## 2020-09-05 DIAGNOSIS — I10 Essential (primary) hypertension: Secondary | ICD-10-CM

## 2020-09-24 DIAGNOSIS — Z9181 History of falling: Secondary | ICD-10-CM | POA: Diagnosis not present

## 2020-09-24 DIAGNOSIS — M6281 Muscle weakness (generalized): Secondary | ICD-10-CM | POA: Diagnosis not present

## 2020-10-24 DIAGNOSIS — Z9181 History of falling: Secondary | ICD-10-CM | POA: Diagnosis not present

## 2020-10-24 DIAGNOSIS — M6281 Muscle weakness (generalized): Secondary | ICD-10-CM | POA: Diagnosis not present

## 2020-10-29 ENCOUNTER — Other Ambulatory Visit: Payer: Self-pay | Admitting: Family Medicine

## 2020-11-14 ENCOUNTER — Telehealth: Payer: Self-pay | Admitting: Family Medicine

## 2020-11-14 NOTE — Telephone Encounter (Signed)
Left message for patient to call back and schedule Medicare Annual Wellness Visit (AWV) either virtually or in office.   Last AWV 11/30/19  please schedule at anytime with [facility] Nurse Health Advisor 1 or 2   This should be a 45 minute visit. 

## 2020-11-24 DIAGNOSIS — Z9181 History of falling: Secondary | ICD-10-CM | POA: Diagnosis not present

## 2020-11-24 DIAGNOSIS — M6281 Muscle weakness (generalized): Secondary | ICD-10-CM | POA: Diagnosis not present

## 2020-12-06 ENCOUNTER — Other Ambulatory Visit: Payer: Medicare PPO

## 2020-12-19 ENCOUNTER — Other Ambulatory Visit: Payer: Medicare PPO

## 2020-12-28 ENCOUNTER — Other Ambulatory Visit: Payer: Self-pay | Admitting: Family Medicine

## 2020-12-28 DIAGNOSIS — E785 Hyperlipidemia, unspecified: Secondary | ICD-10-CM

## 2020-12-28 DIAGNOSIS — I10 Essential (primary) hypertension: Secondary | ICD-10-CM

## 2021-01-03 ENCOUNTER — Ambulatory Visit: Payer: Medicare PPO

## 2021-01-03 ENCOUNTER — Other Ambulatory Visit: Payer: Self-pay

## 2021-01-03 DIAGNOSIS — I6523 Occlusion and stenosis of bilateral carotid arteries: Secondary | ICD-10-CM

## 2021-01-08 NOTE — Progress Notes (Signed)
I have been serially monitoring her carotid arteries.  There appeared to have progression of right carotid stenosis.  I would like to see her back in the office from cardiac standpoint. Cecelia please arrange office visit, not urgent.

## 2021-01-10 NOTE — Progress Notes (Signed)
Called and spoke with patient regarding her CAD results.   Front staff : Patient will call back to schedule that appointment with Dr. Jacinto Halim.

## 2021-01-15 ENCOUNTER — Telehealth: Payer: Self-pay | Admitting: Family Medicine

## 2021-01-15 NOTE — Telephone Encounter (Signed)
Pt called to let us know at this present time she is not interested in scheduling at this present time.

## 2021-01-15 NOTE — Telephone Encounter (Signed)
Left message for patient to call back and schedule Medicare Annual Wellness Visit (AWV) either virtually or in office. No detailed message    Last AWV 11/30/19  please schedule at anytime with [facility] Nurse Health Advisor 1 or 2   This should be a 45 minute visit.

## 2021-01-28 NOTE — Progress Notes (Signed)
Appt made with pt for 03/07/21

## 2021-03-04 ENCOUNTER — Other Ambulatory Visit: Payer: Self-pay | Admitting: Family Medicine

## 2021-03-04 DIAGNOSIS — E785 Hyperlipidemia, unspecified: Secondary | ICD-10-CM

## 2021-03-05 ENCOUNTER — Other Ambulatory Visit: Payer: Self-pay | Admitting: Family Medicine

## 2021-03-05 MED ORDER — CLORAZEPATE DIPOTASSIUM 3.75 MG PO TABS: 3.7500 mg | ORAL_TABLET | Freq: Two times a day (BID) | ORAL | 1 refills | Status: DC | PRN

## 2021-03-06 NOTE — Progress Notes (Signed)
Primary Physician/Referring:  Wynn Banker, MD  Patient ID: Tammy Barron, female    DOB: 1941-03-01, 80 y.o.   MRN: [id number]  Chief Complaint  Patient presents with  . carotid artery stenosis  . Follow-up   HPI:    Tammy Barron  is a 80 y.o. female with paroxysmal atrial fibrillation and carotid stenosis (asymptomatic). She has h/o right brain CVA with occipital infarct and mild right eye visual defect.She was noted to have paroxysmal A. fib during hospitalization in 04/2014 for CVA, she was started on Pradaxa at this time.  Patient lives alone and is relatively inactive.  Patient was last seen in the office 01/09/2019 by Dr. Jacinto Halim who had recommended 40-month follow-up at that time.  Unfortunately patient has been lost to follow-up until now, repeatedly no showing appointments with our office.  She now presents for follow-up of paroxysmal atrial fibrillation and carotid artery stenosis.  Patient is without specific complaints today.  Although she is frustrated as during her recent carotid ultrasound she became dizzy and was unable to complete the study. Denies chest pain, palpitations, dyspnea, syncope, near-syncope, symptoms suggestive of CVA/TIA. She continues to tolerate anticoagulation without bleeding diathesis. Patient keeps her house and lives alone, but is relatively inactive otherwise.   Patient made it clear today that she does not want to pursue aggressive medical intervention including additional carotid imagining. Patient reports she has completed advance care planning paperwork with PCP office.   Past Medical History:  Diagnosis Date  . Anemia   . Anxiety   . Atrial fibrillation (HCC)   . Carotid stenosis, right 01/09/2019  . Depression   . Hx of abnormal Pap smear   . Hyperlipidemia   . Hypertension   . Peripheral artery disease (HCC) 01/09/2019   Past Surgical History:  Procedure Laterality Date  . ABDOMINAL HYSTERECTOMY  1982  . ANKLE ARTHODESIS W/  ARTHROSCOPY     Family History  Problem Relation Age of Onset  . Heart disease Mother   . Heart attack Father   . Cancer Sister   . Hypertension Maternal Aunt     Social History   Tobacco Use  . Smoking status: Current Some Day Smoker    Packs/day: 0.25    Years: 40.00    Pack years: 10.00    Types: Cigarettes    Last attempt to quit: 11/10/2018    Years since quitting: 2.3  . Smokeless tobacco: Never Used  . Tobacco comment: 3-4 per day  Substance Use Topics  . Alcohol use: No    Alcohol/week: 0.0 standard drinks   Marital Status: Widowed   ROS  Review of Systems  Constitutional: Negative for malaise/fatigue and weight gain.  Eyes: Positive for blurred vision (cataract).  Cardiovascular: Negative for chest pain, claudication, leg swelling, near-syncope, orthopnea, palpitations, paroxysmal nocturnal dyspnea and syncope.  Respiratory: Negative for shortness of breath.   Hematologic/Lymphatic: Does not bruise/bleed easily.  Gastrointestinal: Negative for melena.  Neurological: Negative for dizziness and weakness.    Objective  Blood pressure 128/68, pulse 66, temperature 98.2 F (36.8 C), height 5\' 4"  (1.626 m), weight 185 lb 6.4 oz (84.1 kg), SpO2 96 %.  Vitals with BMI 03/10/2021 06/21/2020 11/30/2019  Height 5\' 4"  - 5\' 4"   Weight 185 lbs 6 oz 178 lbs 2 oz 174 lbs  BMI 31.81 - 29.85  Systolic 128 112 12/02/2019  Diastolic 68 80 80  Pulse 66 66 -      Physical Exam Vitals reviewed.  Constitutional:      Appearance: She is obese.     Comments: Ambulates with cane   HENT:     Head: Normocephalic and atraumatic.  Cardiovascular:     Rate and Rhythm: Normal rate and regular rhythm.     Pulses: Intact distal pulses.          Carotid pulses are on the right side with bruit and on the left side with bruit.    Heart sounds: S1 normal and S2 normal. No murmur heard. No gallop.   Pulmonary:     Effort: Pulmonary effort is normal. No respiratory distress.     Breath sounds: No  wheezing, rhonchi or rales.  Musculoskeletal:     Right lower leg: No edema.     Left lower leg: No edema.  Neurological:     Mental Status: She is alert.     Laboratory examination:   Recent Labs    06/03/20 1302  NA 139  K 4.2  CL 102  CO2 33*  GLUCOSE 102*  BUN 17  CREATININE 0.94*  CALCIUM 9.3   CrCl cannot be calculated (Patient's most recent lab result is older than the maximum 21 days allowed.).  CMP Latest Ref Rng & Units 06/03/2020 08/02/2019 07/31/2019  Glucose 65 - 99 mg/dL 086(V) 784(O) 962(X)  BUN 7 - 25 mg/dL 17 15 21   Creatinine 0.60 - 0.88 mg/dL ) 5.28(U 1.32)  Sodium 135 - 146 mmol/L 139 132(L) 134(L)  Potassium 3.5 - 5.3 mmol/L 4.2 3.3(L) 4.3  Chloride 98 - 110 mmol/L 102 100 100  CO2 20 - 32 mmol/L 33(H) 24 24  Calcium 8.6 - 10.4 mg/dL 9.3 4.40(N) 9.4  Total Protein 6.1 - 8.1 g/dL 6.6 6.2(L) -  Total Bilirubin 0.2 - 1.2 mg/dL 0.5 0.9 -  Alkaline Phos 38 - 126 U/L - 38 -  AST 10 - 35 U/L 19 26 -  ALT 6 - 29 U/L 12 16 -   CBC Latest Ref Rng & Units 06/03/2020 08/02/2019 07/31/2019  WBC 3.8 - 10.8 Thousand/uL 7.0 9.0 11.0(H)  Hemoglobin 11.7 - 15.5 g/dL 08/02/2019 25.3 66.4  Hematocrit 35.0 - 45.0 % 44.7 38.7 43.7  Platelets 140 - 400 Thousand/uL 165 127(L) 160    Lipid Panel Recent Labs    06/03/20 1302  CHOL 122  TRIG 165*  LDLCALC 52  HDL 46*  CHOLHDL 2.7    HEMOGLOBIN A1C Lab Results  Component Value Date   HGBA1C 5.3 02/01/2014   MPG 105 02/01/2014   TSH No results for input(s): TSH in the last 8760 hours.  External labs:  None   Medications and allergies   Allergies  Allergen Reactions  . Latex Rash     Outpatient Medications Prior to Visit  Medication Sig Dispense Refill  . acetaminophen (TYLENOL) 500 MG tablet Take 500 mg by mouth every 6 (six) hours as needed for mild pain, moderate pain or headache.     04/03/2014 atorvastatin (LIPITOR) 10 MG tablet TAKE 1 TABLET BY MOUTH EVERY DAY AT NIGHT 90 tablet 0  .  bisoprolol-hydrochlorothiazide (ZIAC) 10-6.25 MG tablet TAKE 1 TABLET BY MOUTH EVERY DAY (Patient taking differently: Take 1 tablet by mouth daily.) 90 tablet 0  . clorazepate (TRANXENE) 3.75 MG tablet Take 1 tablet (3.75 mg total) by mouth 2 (two) times daily as needed for anxiety. 60 tablet 1  . dabigatran (PRADAXA) 150 MG CAPS capsule Take 1 capsule (150 mg total) by mouth 2 (two) times daily. 180  capsule 3  . Multiple Vitamin (MULTIVITAMIN) tablet Take 1 tablet by mouth daily.    . Sennosides (SENOKOT PO) Take by mouth as needed.     No facility-administered medications prior to visit.     Radiology:   No results found.  Cardiac Studies:   Echocardiogram  TTE - 02/01/2014 -  EF 55% to 60% - wall motion was normal - grade 1 diastolic dysfunction - no evidence of embolic source  Carotid artery duplex 01/03/2021:  Stenosis in the right internal carotid artery (>=70%) due to heterogenous plaque, ICA to CCA ratio 6.2.  No significant stenosis within the right common carotid artery.  Stenosis in the right ECA and right carotid bifurcation <50% due to heterogenous plaque.  Left ICA not visualized due to patient discomfort.  Peak systolic velocities in the left common carotid, external carotid arteries and the carotid bifurcations within normal limits.  Bilateral vertebral arteries not visualized.  Compared to prior study dated 04/07/2020: Right ICA stenosis remained stable; however, ICA to CCA ratio has increased from 4.97 to 6.2. Left ICA stenosis was noted to be 50-69% stenosis; however, it was not studied as a part of this duplex.  Recommendations: May consider CTA or MRA to further evaluate bilateral internal carotid artery stenosis given the findings above. Clinical correlation is required.  Follow-up study in 6 months is appropriate if clinically indicated.  EKG:   EKG 03/10/2021: Sinus rhythm with first-degree AV block at a rate of 65 bpm.  Normal axis.  Incomplete right bundle  branch block.  Poor R wave progression, cannot exclude anteroseptal infarct old.  Low voltage complexes, consider pulmonary disease pattern.  No evidence of ischemia or underlying injury pattern.  Compared to EKG 01/09/2019, no significant change.  EKG 01/09/2019: Sinus rhythm with first-degree AV block at the rate of 63 bpm, borderline criteria for left atrial enlargement, otherwise normal EKG. No significant change from  EKG 06/14/2017.  Assessment     ICD-10-CM   1. Asymptomatic bilateral carotid artery stenosis  I65.23 EKG 12-Lead  2. Paroxysmal atrial fibrillation (HCC)  I48.0 EKG 12-Lead  3. Essential hypertension  I10   4. Hypercholesteremia  E78.00      Medications Discontinued During This Encounter  Medication Reason  . Sennosides (SENOKOT PO) Error    No orders of the defined types were placed in this encounter. This patients CHA2DS2-VASc Score 7 (CVA, F, Age, HTN, vasc) and yearly risk of stroke >9.8%.   Recommendations:   Tammy Barron is a 80 y.o.  female with paroxysmal atrial fibrillation and carotid stenosis (asymptomatic), as well as hyperlipidemia, hypertension, PAD. She has h/o right brain CVA with occipital infarct and mild right eye visual defect.She was noted to have paroxysmal A. fib during hospitalization in 04/2014 for CVA, she was started on Pradaxa at this time.  Patient lives alone and is relatively inactive.  Patient was last seen in the office 01/09/2019 by Dr. Jacinto HalimGanji who had recommended 7478-month follow-up at that time.  Unfortunately patient has been lost to follow-up until now, repeatedly no showing appointments with our office.  She now presents for follow-up of paroxysmal atrial fibrillation and carotid artery stenosis.  Reviewed and discussed with patient regarding recent carotid artery duplex which reveals progression of carotid artery stenosis.  Discussed with patient proceeding with further imaging either CTA or MRA to further evaluate bilateral internal  carotid artery stenosis.  However patient prefers not to pursue additional testing, despite discussion of risks versus benefits.  Shared  decision was therefore to hold off on further evaluation of carotid artery stenosis and instead continue with medical therapy.  Patient has made it very clear she does not wish to be considered for surgical intervention should the indication present itself even.  Blood pressure was initially elevated in the office, however upon recheck it is well controlled.  We will therefore not make changes to antihypertensive medications at this time.  Patient continues to tolerate Pradaxa without bleeding diathesis, and continues to wish not to switch to Eliquis or Xarelto.  I personally reviewed external labs, lipids are well controlled with the exception of elevated triglycerides and renal function remained stable.  Defer further management of hyperlipidemia to PCP.  Patient denies symptoms of claudication.  No changes were made to patient's medications at this time.  Shared decision was to hold off on pursuing further evaluation or consideration of vascular surgery intervention for carotid artery stenosis.  Patient also prefers to follow-up on an annual basis rather than every 6 months.  Advised patient regarding signs and symptoms that would warrant more urgent evaluation, she verbalized understanding agreement.  Follow-up in 1 year, sooner if needed, for carotid artery stenosis, paroxysmal atrial fibrillation, and hypertension.   Rayford Halsted, PA-C 03/10/2021, 2:50 PM Office: [phone]

## 2021-03-07 ENCOUNTER — Ambulatory Visit: Payer: Medicare PPO | Admitting: Cardiology

## 2021-03-10 ENCOUNTER — Encounter: Payer: Self-pay | Admitting: Student

## 2021-03-10 ENCOUNTER — Other Ambulatory Visit: Payer: Self-pay

## 2021-03-10 ENCOUNTER — Ambulatory Visit: Payer: Medicare PPO | Admitting: Student

## 2021-03-10 VITALS — BP 128/68 | HR 66 | Temp 98.2°F | Ht 64.0 in | Wt 185.4 lb

## 2021-03-10 DIAGNOSIS — I480 Paroxysmal atrial fibrillation: Secondary | ICD-10-CM

## 2021-03-10 DIAGNOSIS — E7800 Pure hypercholesterolemia, unspecified: Secondary | ICD-10-CM

## 2021-03-10 DIAGNOSIS — I6523 Occlusion and stenosis of bilateral carotid arteries: Secondary | ICD-10-CM

## 2021-03-10 DIAGNOSIS — I10 Essential (primary) hypertension: Secondary | ICD-10-CM

## 2021-04-09 ENCOUNTER — Telehealth: Payer: Self-pay | Admitting: Family Medicine

## 2021-04-09 NOTE — Telephone Encounter (Signed)
Left message for patient to call back and schedule Medicare Annual Wellness Visit (AWV) either virtually or in office.   Last AWV 11/30/19  please schedule at anytime with [facility] Nurse Health Advisor 1 or 2   This should be a 45 minute visit. 

## 2021-04-25 ENCOUNTER — Other Ambulatory Visit: Payer: Self-pay | Admitting: Family Medicine

## 2021-04-25 DIAGNOSIS — I480 Paroxysmal atrial fibrillation: Secondary | ICD-10-CM

## 2021-04-25 DIAGNOSIS — I10 Essential (primary) hypertension: Secondary | ICD-10-CM

## 2021-04-25 DIAGNOSIS — Z8673 Personal history of transient ischemic attack (TIA), and cerebral infarction without residual deficits: Secondary | ICD-10-CM

## 2021-05-04 ENCOUNTER — Other Ambulatory Visit: Payer: Self-pay | Admitting: Family Medicine

## 2021-05-04 MED ORDER — CLORAZEPATE DIPOTASSIUM 3.75 MG PO TABS: 3.7500 mg | ORAL_TABLET | Freq: Two times a day (BID) | ORAL | 1 refills | Status: DC | PRN

## 2021-05-13 ENCOUNTER — Telehealth: Payer: Self-pay | Admitting: Family Medicine

## 2021-05-13 NOTE — Telephone Encounter (Signed)
Left message for patient to call back and schedule Medicare Annual Wellness Visit (AWV) either virtually or in office.   Last AWV 11/30/19  please schedule at anytime with [facility] Nurse Health Advisor 1 or 2   This should be a 45 minute visit. 

## 2021-05-14 ENCOUNTER — Telehealth: Payer: Self-pay | Admitting: Family Medicine

## 2021-05-14 NOTE — Telephone Encounter (Signed)
Pt don't want a appt for a AWV.

## 2021-05-15 NOTE — Telephone Encounter (Signed)
Documented on spreadsheet 

## 2021-06-29 IMAGING — CT CT HIP*L* W/O CM
2 of 5 series · 17 of 46 positions shown, 19 images · non-contrast
Comparison: Left hip x-rays 6688 hours today.

CLINICAL DATA: 78-year-old female status post fall with left hip
pain.

EXAM:
CT OF THE LEFT HIP WITHOUT CONTRAST
TECHNIQUE: Multidetector CT imaging of the left hip was performed according to
the standard protocol. Multiplanar CT image reconstructions were
also generated.

[Series 3: axial st · axial · 0.49mm/px · z∈[-516,-310]mm · 14 of 119 slices shown, 16 images]
[im 8/119  soft-tissue]
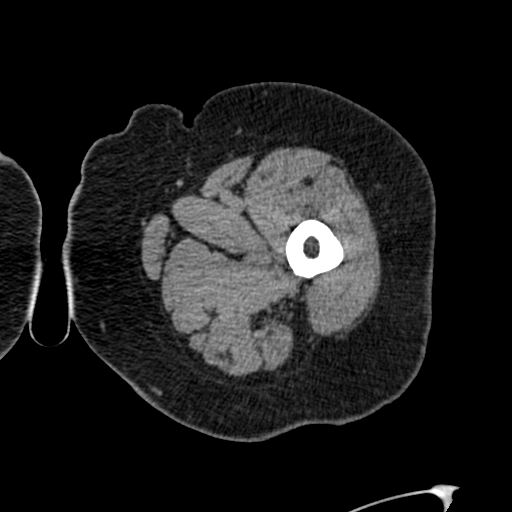
[im 8/119  bone]
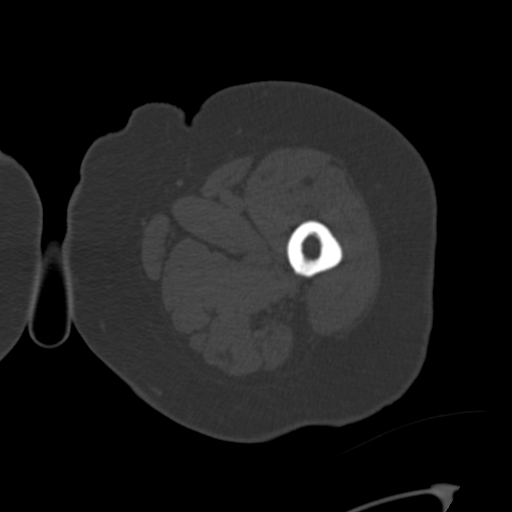
[im 16/119  soft-tissue]
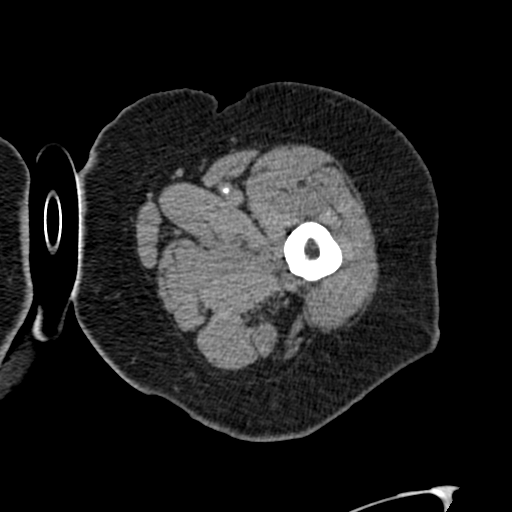
[im 23/119  soft-tissue]
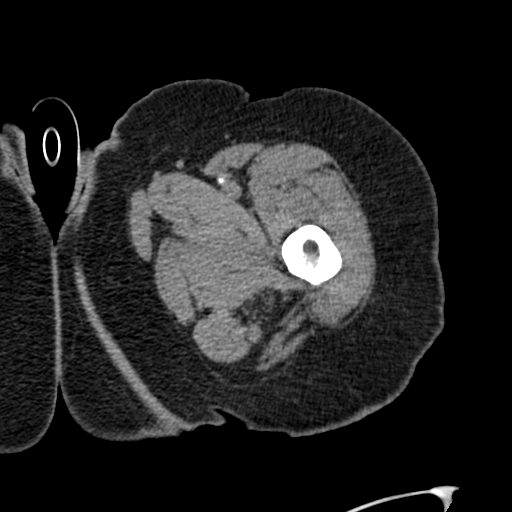
[im 31/119  soft-tissue]
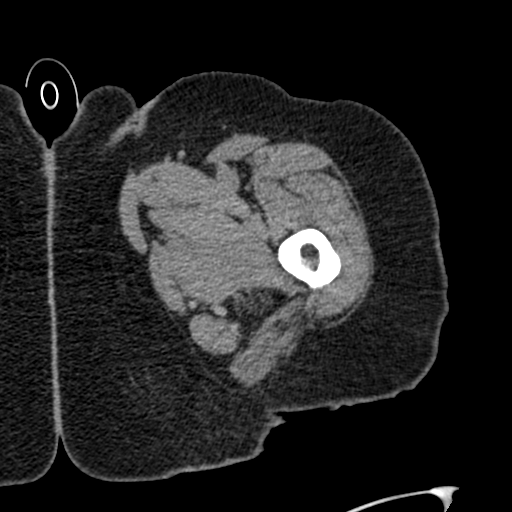
[im 39/119  soft-tissue]
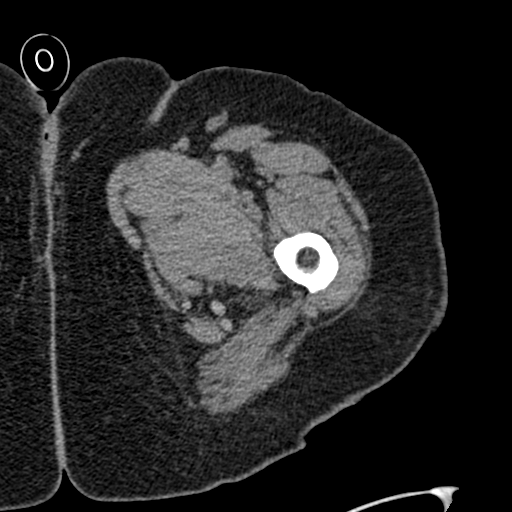
[im 46/119  soft-tissue]
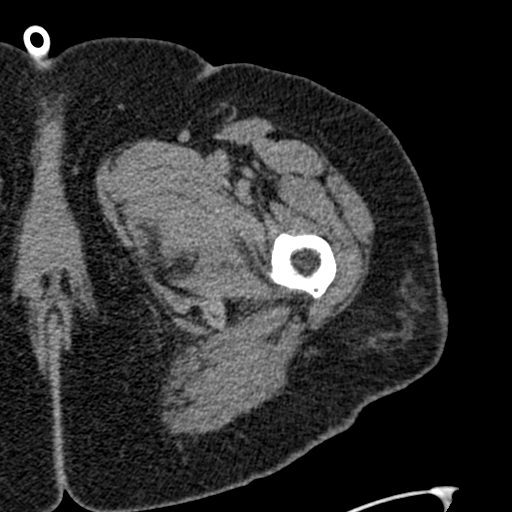
[im 54/119  soft-tissue]
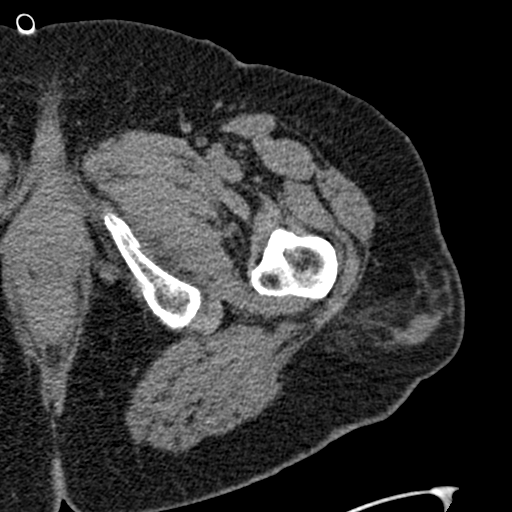
[im 65/119  soft-tissue]
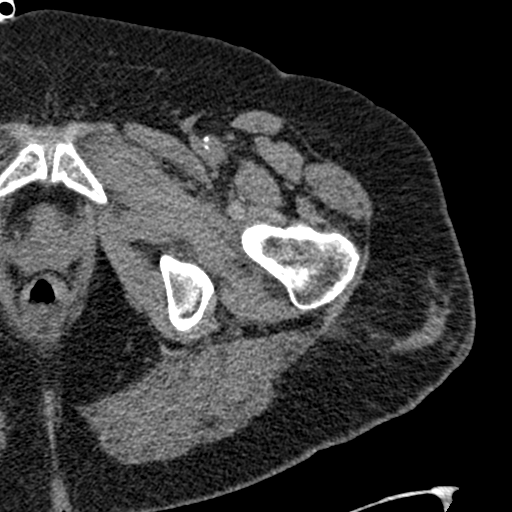
[im 73/119  soft-tissue]
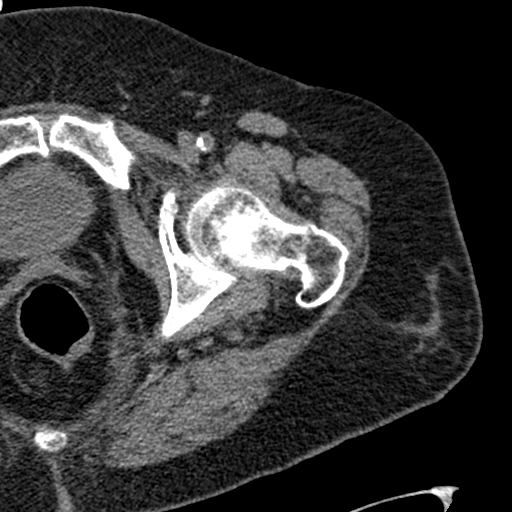
[im 73/119  bone]
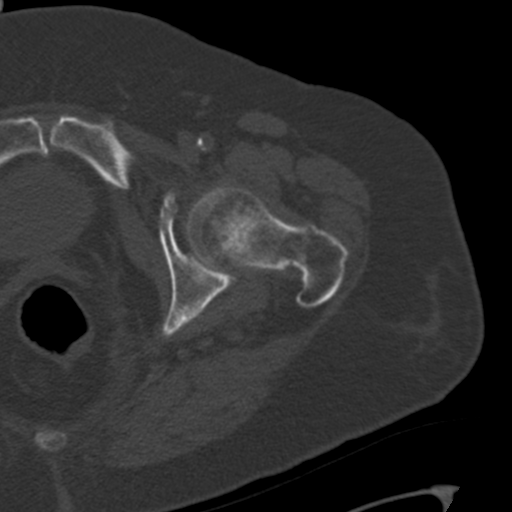
[im 80/119  soft-tissue]
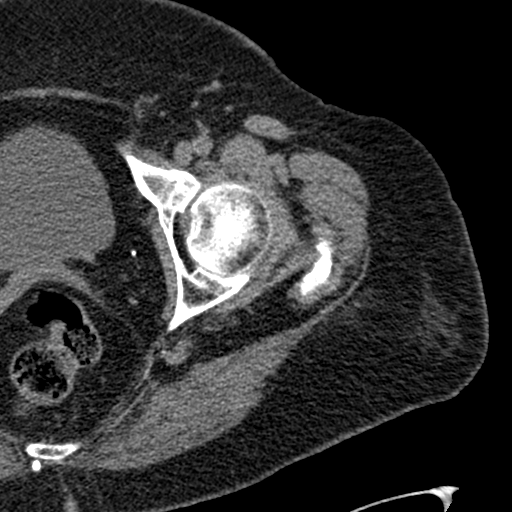
[im 88/119  soft-tissue]
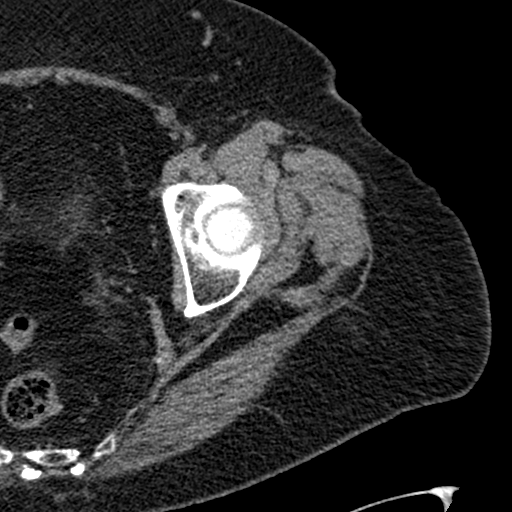
[im 96/119  soft-tissue]
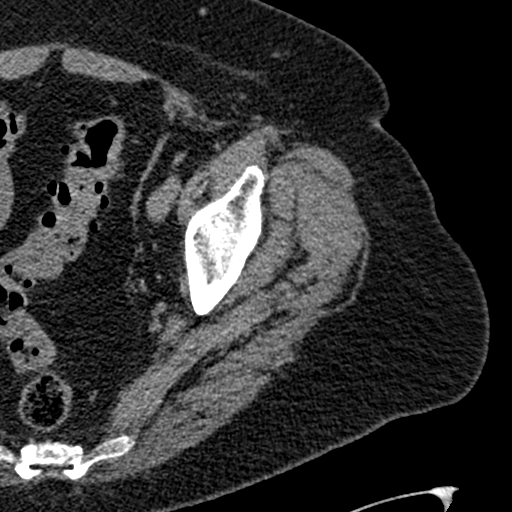
[im 103/119  soft-tissue]
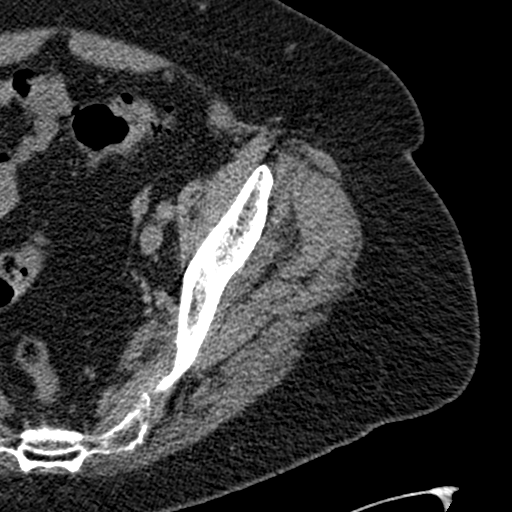
[im 111/119  soft-tissue]
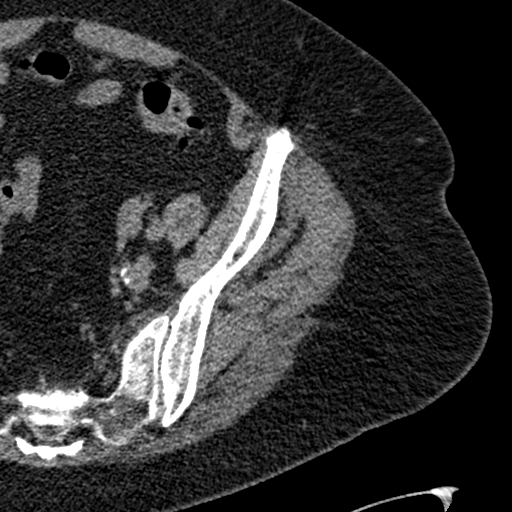

[Series 8: coronal st · coronal · 0.49mm/px · 3 of 127 slices shown]
[im 26/127  soft-tissue]
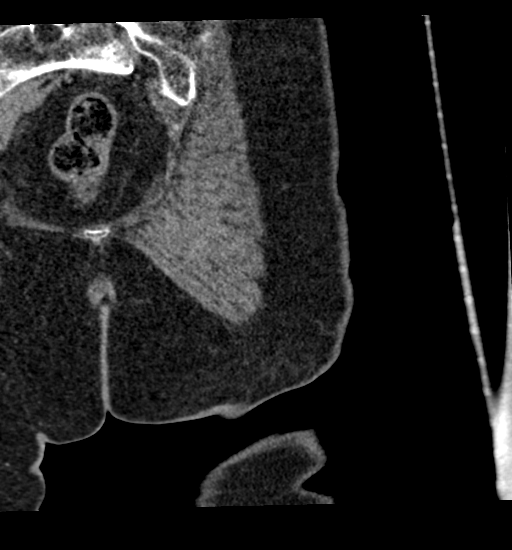
[im 51/127  soft-tissue]
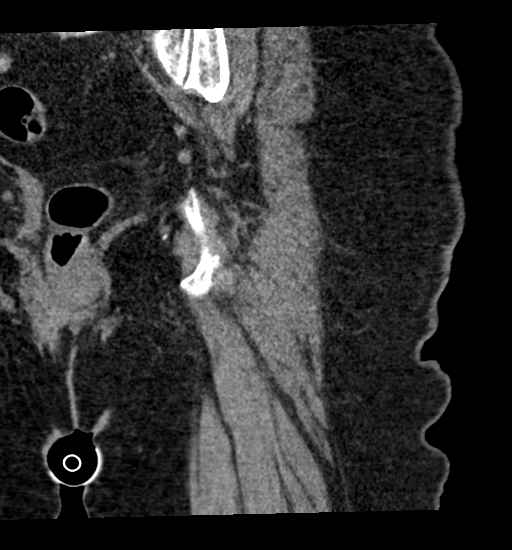
[im 76/127  soft-tissue]
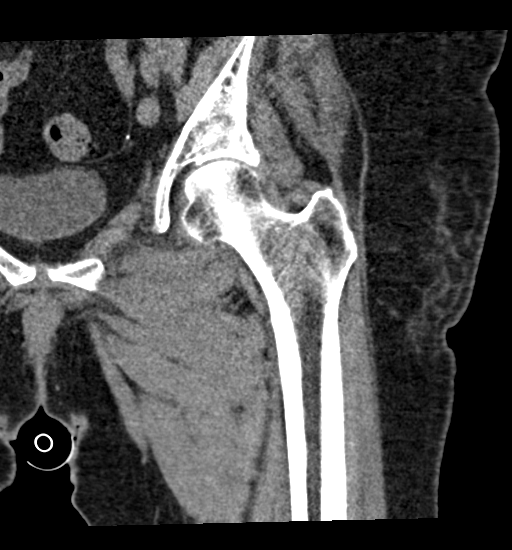

[17 of 46 positions shown; findings below may reference images not displayed]

FINDINGS: Diverticulosis of the sigmoid colon, normal diminutive left ovary,
small pelvic phleboliths and iliac artery calcified atherosclerosis
in the visible pelvis. No pelvic free fluid.

Intact visible left sacral ala and SI joint. Oblique minimally
displaced fracture of the left inferior pubic ramus on series 2,
image 64. The pubic symphysis, left superior ramus, left acetabulum
and iliac wing remain intact.

The proximal left femur is intact. There is some subcutaneous soft
tissue contusion/hematoma overlying the left greater trochanter on
series 3, image 66.

No other superficial soft tissue injury identified. Calcified plaque
continues in the left femoral artery. No left inguinal
lymphadenopathy.
IMPRESSION: 1. Minimally displaced left inferior pubic ramus fracture.
2. No other acute fracture identified about the left hip. There is a
mild soft tissue contusion/hematoma overlying the left greater
trochanter.
3. Sigmoid diverticulosis.

## 2021-06-30 IMAGING — DX DG CHEST 1V PORT
1 series · 1 of 1 positions shown · non-contrast
Comparison: None.

CLINICAL DATA: 78 y.o female. SOB. Anticipated discharge.

EXAM:
PORTABLE CHEST 1 VIEW

[chest ap]
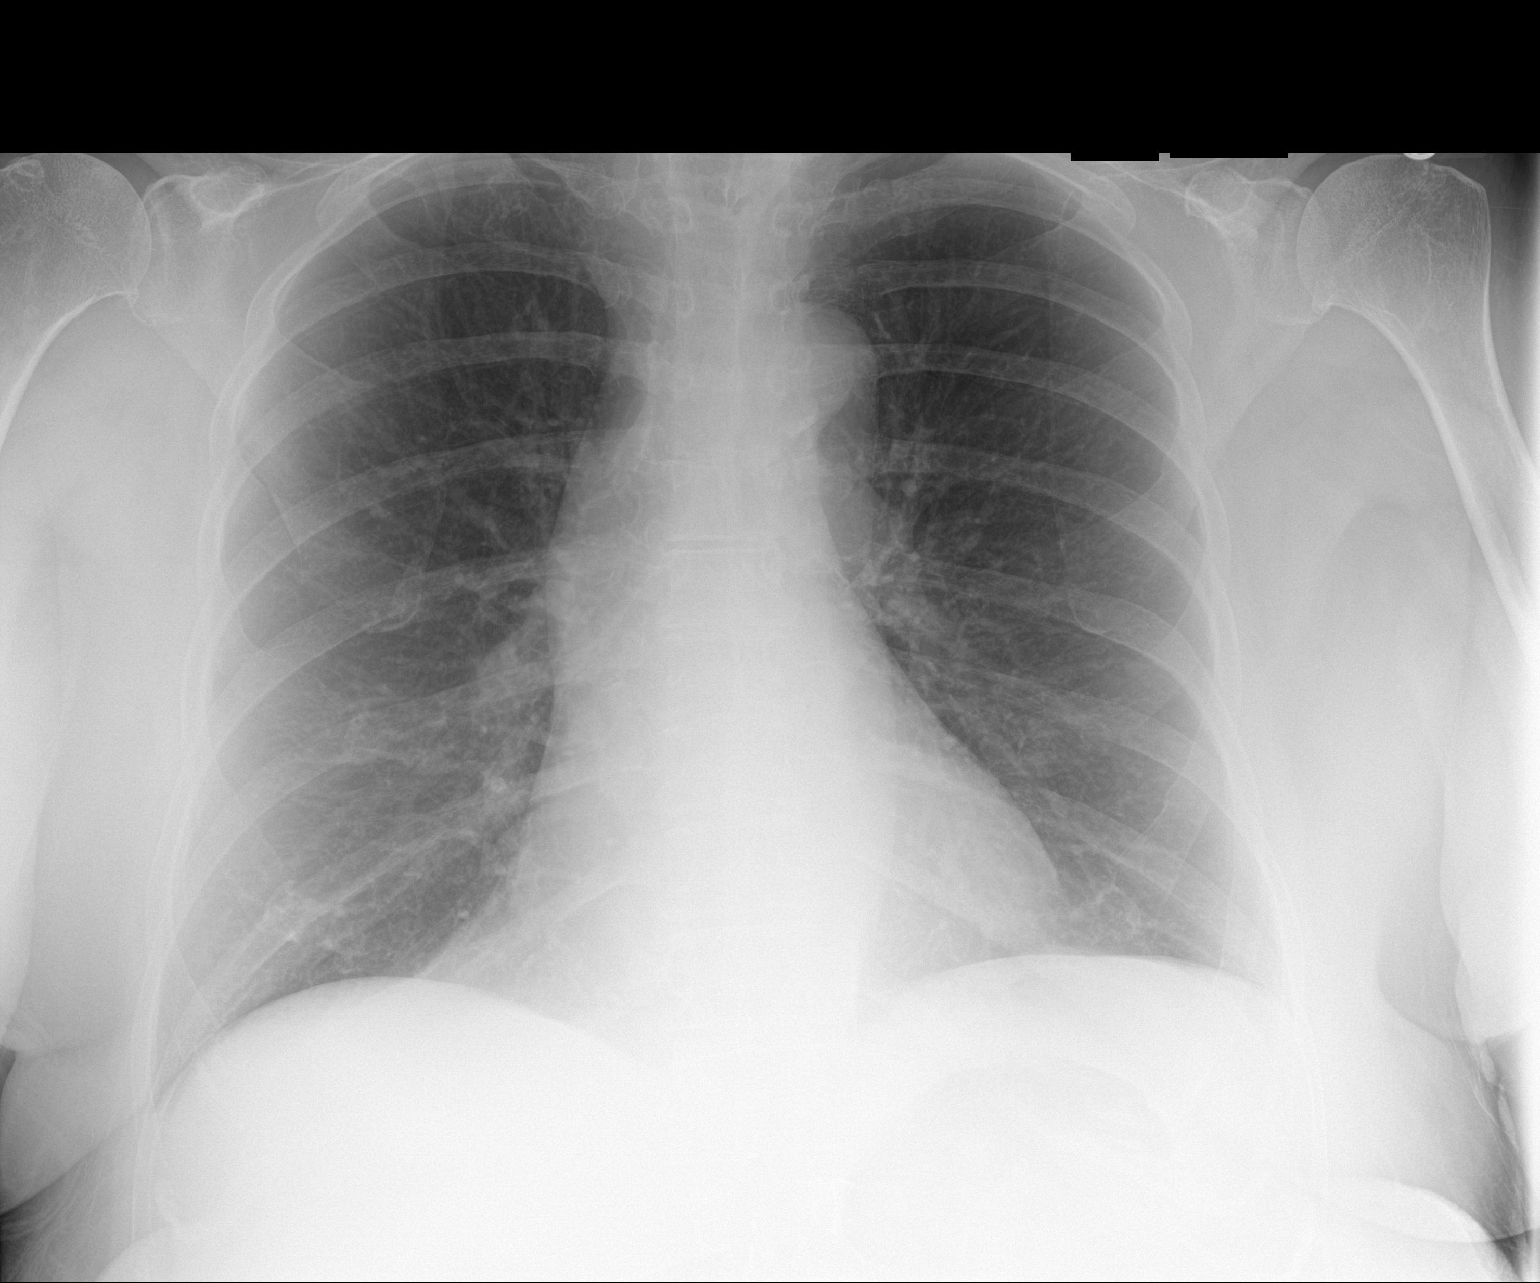

[1 of 1 positions shown; findings below may reference images not displayed]

FINDINGS: Normal mediastinum and cardiac silhouette. Normal pulmonary
vasculature. No evidence of effusion, infiltrate, or pneumothorax.
No acute bony abnormality.
IMPRESSION: No acute cardiopulmonary process.

## 2021-07-16 ENCOUNTER — Telehealth: Payer: Self-pay | Admitting: Family Medicine

## 2021-07-16 NOTE — Telephone Encounter (Signed)
Left message for patient to call back and schedule Medicare Annual Wellness Visit (AWV) either virtually or in office. Left  my jabber number [phone]   Last AWV ;10/09/20 please schedule at anytime with [facility] Nurse Health Advisor 1 or 2   This should be a 45 minute visit.  

## 2021-07-19 ENCOUNTER — Other Ambulatory Visit: Payer: Self-pay | Admitting: Family Medicine

## 2021-07-19 DIAGNOSIS — E785 Hyperlipidemia, unspecified: Secondary | ICD-10-CM

## 2021-07-30 ENCOUNTER — Other Ambulatory Visit: Payer: Self-pay

## 2021-07-30 ENCOUNTER — Ambulatory Visit (INDEPENDENT_AMBULATORY_CARE_PROVIDER_SITE_OTHER): Payer: Medicare PPO

## 2021-07-30 DIAGNOSIS — Z0000 Encounter for general adult medical examination without abnormal findings: Secondary | ICD-10-CM | POA: Diagnosis not present

## 2021-07-30 NOTE — Progress Notes (Addendum)
Virtual Visit via Telephone Note  I connected with  Tammy Barron on 07/30/21 at  1:00 PM EDT by telephone and verified that I am speaking with the correct person using two identifiers.  Medicare Annual Wellness visit completed telephonically due to Covid-19 pandemic.   Persons participating in this call: This Health Coach and this patient.   Location: Patient: Home Provider: Office   I discussed the limitations, risks, security and privacy concerns of performing an evaluation and management service by telephone and the availability of in person appointments. The patient expressed understanding and agreed to proceed.  Unable to perform video visit due to video visit attempted and failed and/or patient does not have video capability.   Some vital signs may be absent or patient reported.   Marzella Schlein, LPN   Subjective:   Tammy Barron is a 80 y.o. female who presents for Medicare Annual (Subsequent) preventive examination.  Review of Systems     Cardiac Risk Factors include: advanced age (>3men, >58 women);hypertension;dyslipidemia;obesity (BMI >30kg/m2);smoking/ tobacco exposure     Objective:    There were no vitals filed for this visit. There is no height or weight on file to calculate BMI.  Advanced Directives 07/30/2021 11/30/2019 08/01/2019 10/05/2018 06/10/2017 12/10/2016 04/09/2016  Does Patient Have a Medical Advance Directive? Yes Yes Yes Yes No Yes Yes  Type of Advance Directive Living will Living will;Healthcare Power of [employer] Power of [place];Living will - - Healthcare Power of [place];Living will Healthcare Power of Attorney  Does patient want to make changes to medical advance directive? - No - Patient declined No - Patient declined - - - No - Patient declined  Copy of Healthcare Power of Attorney in Chart? - No - copy requested No - copy requested - - - No - copy requested  Would patient like information on creating a medical advance  directive? - - No - Patient declined - No - Patient declined - -  Pre-existing out of facility DNR order (yellow form or pink MOST form) - - - - - - -    Current Medications (verified) Outpatient Encounter Medications as of 07/30/2021  Medication Sig   acetaminophen (TYLENOL) 500 MG tablet Take 500 mg by mouth every 6 (six) hours as needed for mild pain, moderate pain or headache.    atorvastatin (LIPITOR) 10 MG tablet TAKE 1 TABLET BY MOUTH EVERY DAY AT NIGHT   bisoprolol-hydrochlorothiazide (ZIAC) 10-6.25 MG tablet TAKE 1 TABLET BY MOUTH EVERY DAY   clorazepate (TRANXENE) 3.75 MG tablet Take 1 tablet (3.75 mg total) by mouth 2 (two) times daily as needed for anxiety.   Multiple Vitamin (MULTIVITAMIN) tablet Take 1 tablet by mouth daily.   PRADAXA 150 MG CAPS capsule TAKE 1 CAPSULE BY MOUTH TWICE A DAY   No facility-administered encounter medications on file as of 07/30/2021.    Allergies (verified) Latex   History: Past Medical History:  Diagnosis Date   Anemia    Anxiety    Atrial fibrillation (HCC)    Carotid stenosis, right 01/09/2019   Depression    Hx of abnormal Pap smear    Hyperlipidemia    Hypertension    Peripheral artery disease (HCC) 01/09/2019   Past Surgical History:  Procedure Laterality Date   ABDOMINAL HYSTERECTOMY  1982   ANKLE ARTHODESIS W/ ARTHROSCOPY     Family History  Problem Relation Age of Onset   Heart disease Mother    Heart attack Father    Cancer Sister  Hypertension Maternal Aunt    Social History   Socioeconomic History   Marital status: Widowed    Spouse name: Not on file   Number of children: 1   Years of education: Not on file   Highest education level: Not on file  Occupational History   Not on file  Tobacco Use   Smoking status: Some Days    Packs/day: 0.25    Years: 40.00    Pack years: 10.00    Types: Cigarettes    Last attempt to quit: 11/10/2018    Years since quitting: 2.7   Smokeless tobacco: Never   Tobacco  comments:    3-4 per day  Vaping Use   Vaping Use: Never used  Substance and Sexual Activity   Alcohol use: No    Alcohol/week: 0.0 standard drinks   Drug use: Never   Sexual activity: Not on file  Other Topics Concern   Not on file  Social History Narrative   HH 1   2 children out of state   2 grandchildren   Brother, niece, & nephew live locally   Social Determinants of Health   Financial Resource Strain: Low Risk    Difficulty of Paying Living Expenses: Not hard at all  Food Insecurity: No Food Insecurity   Worried About Programme researcher, broadcasting/film/video in the Last Year: Never true   Barista in the Last Year: Never true  Transportation Needs: No Transportation Needs   Lack of Transportation (Medical): No   Lack of Transportation (Non-Medical): No  Physical Activity: Inactive   Days of Exercise per Week: 0 days   Minutes of Exercise per Session: 0 min  Stress: No Stress Concern Present   Feeling of Stress : Not at all  Social Connections: Socially Isolated   Frequency of Communication with Friends and Family: More than three times a week   Frequency of Social Gatherings with Friends and Family: More than three times a week   Attends Religious Services: Never   Database administrator or Organizations: No   Attends Banker Meetings: Never   Marital Status: Widowed    Tobacco Counseling Ready to quit: Not Answered Counseling given: Not Answered Tobacco comments: 3-4 per day   Clinical Intake:  Pre-visit preparation completed: Yes  Pain : No/denies pain     BMI - recorded: 31.82 Nutritional Status: BMI > 30  Obese Nutritional Risks: None Diabetes: No  How often do you need to have someone help you when you read instructions, pamphlets, or other written materials from your doctor or pharmacy?: 1 - Never  Diabetic?No  Interpreter Needed?: No  Information entered by :: Shakeema Lippman, LPN   Activities of Daily Living In your present state of  health, do you have any difficulty performing the following activities: 07/30/2021  Hearing? N  Vision? N  Difficulty concentrating or making decisions? N  Walking or climbing stairs? N  Dressing or bathing? N  Doing errands, shopping? N  Preparing Food and eating ? N  Using the Toilet? N  Managing your Medications? N  Managing your Finances? N  Housekeeping or managing your Housekeeping? N  Some recent data might be hidden    Patient Care Team: Wynn Banker, MD as PCP - General (Family Medicine) Yates Decamp, MD as Consulting Physician (Cardiology)  Indicate any recent Medical Services you may have received from other than [facility] providers in the past year (date may be approximate).  Assessment:   This is a routine wellness examination for [facility].  Hearing/Vision screen Hearing Screening - Comments:: Pt denies hearing issue Vision Screening - Comments:: Pt follows up with dr at [facility] for annual eye exams   Dietary issues and exercise activities discussed: Current Exercise Habits: The patient does not participate in regular exercise at present   Goals Addressed             This Visit's Progress    Patient Stated       Stay healthy        Depression Screen PHQ 2/9 Scores 07/30/2021 11/30/2019 10/05/2018 01/05/2017 10/18/2015  PHQ - 2 Score 0 0 0 0 0    Fall Risk Fall Risk  07/30/2021 11/30/2019 10/05/2018 01/05/2017 10/18/2015  Falls in the past year? 0 1 0 No No  Number falls in past yr: 0 0 - - -  Injury with Fall? 0 1 - - -  Risk for fall due to : Impaired vision History of fall(s);Medication side effect - - -  Follow up Falls prevention discussed Falls evaluation completed;Education provided;Falls prevention discussed - - -    FALL RISK PREVENTION PERTAINING TO THE HOME:  Any stairs in or around the home? Yes  If so, are there any without handrails? No  Home free of loose throw rugs in walkways, pet beds, electrical cords, etc? Yes  Adequate lighting  in your home to reduce risk of falls? Yes   ASSISTIVE DEVICES UTILIZED TO PREVENT FALLS:  Life alert? Yes  smart phone app Use of a cane, walker or w/c? Yes  Grab bars in the bathroom? Yes  Shower chair or bench in shower? Yes  Elevated toilet seat or a handicapped toilet? No   TIMED UP AND GO:  Was the test performed? No .   Cognitive Function: MMSE - Mini Mental State Exam 10/05/2018  Not completed: (No Data)     6CIT Screen 07/30/2021 11/30/2019  What Year? 0 points 0 points  What month? 0 points 0 points  What time? 0 points 0 points  Count back from 20 0 points 0 points  Months in reverse 4 points 0 points  Repeat phrase 4 points 0 points  Total Score 8 0    Immunizations Immunization History  Administered Date(s) Administered   Influenza Whole 08/22/2009   Influenza, High Dose Seasonal PF 10/18/2015, 09/18/2017, 10/05/2018   Influenza,inj,Quad PF,6+ Mos 08/23/2014   Influenza-Unspecified 09/02/2013, 10/05/2016, 08/03/2019   PFIZER(Purple Top)SARS-COV-2 Vaccination 12/07/2019, 12/28/2019   Pneumococcal Conjugate-13 08/23/2014   Pneumococcal Polysaccharide-23 08/06/2008, 10/08/2010    TDAP status: Due, Education has been provided regarding the importance of this vaccine. Advised may receive this vaccine at local pharmacy or Health Dept. Aware to provide a copy of the vaccination record if obtained from local pharmacy or Health Dept. Verbalized acceptance and understanding.  Flu Vaccine status: Due, Education has been provided regarding the importance of this vaccine. Advised may receive this vaccine at local pharmacy or Health Dept. Aware to provide a copy of the vaccination record if obtained from local pharmacy or Health Dept. Verbalized acceptance and understanding.  Pneumococcal vaccine status: Up to date  Covid-19 vaccine status: Completed vaccines  Qualifies for Shingles Vaccine? Yes   Zostavax completed No   Shingrix Completed?: No.    Education has been  provided regarding the importance of this vaccine. Patient has been advised to call insurance company to determine out of pocket expense if they have not yet received this vaccine. Advised  may also receive vaccine at local pharmacy or Health Dept. Verbalized acceptance and understanding.  Screening Tests Health Maintenance  Topic Date Due   TETANUS/TDAP  Never done   Zoster Vaccines- Shingrix (1 of 2) Never done   COVID-19 Vaccine (3 - Pfizer risk series) 01/25/2020   INFLUENZA VACCINE  06/02/2021   HPV VACCINES  Aged Out    Health Maintenance  Health Maintenance Due  Topic Date Due   TETANUS/TDAP  Never done   Zoster Vaccines- Shingrix (1 of 2) Never done   COVID-19 Vaccine (3 - Pfizer risk series) 01/25/2020   INFLUENZA VACCINE  06/02/2021    Colorectal cancer screening: No longer required.   Mammogram status: No longer required due to age.    Additional Screening:  Vision Screening: Recommended annual ophthalmology exams for early detection of glaucoma and other disorders of the eye. Is the patient up to date with their annual eye exam?  Yes  Who is the provider or what is the name of the office in which the patient attends annual eye exams? [facility] If pt is not established with a provider, would they like to be referred to a provider to establish care? No .   Dental Screening: Recommended annual dental exams for proper oral hygiene  Community Resource Referral / Chronic Care Management: CRR required this visit?  No   CCM required this visit?  No      Plan:     I have personally reviewed and noted the following in the patient's chart:   Medical and social history Use of alcohol, tobacco or illicit drugs  Current medications and supplements including opioid prescriptions.  Functional ability and status Nutritional status Physical activity Advanced directives List of other physicians Hospitalizations, surgeries, and ER visits in previous 12  months Vitals Screenings to include cognitive, depression, and falls Referrals and appointments  In addition, I have reviewed and discussed with patient certain preventive protocols, quality metrics, and best practice recommendations. A written personalized care plan for preventive services as well as general preventive health recommendations were provided to patient.     Marzella Schlein, LPN   7/35/3299   Nurse Notes: None

## 2021-07-30 NOTE — Patient Instructions (Signed)
Tammy Barron , Thank you for taking time to come for your Medicare Wellness Visit. I appreciate your ongoing commitment to your health goals. Please review the following plan we discussed and let me know if I can assist you in the future.   Screening recommendations/referrals: Colonoscopy: no longer required  Mammogram: No longer required  Recommended yearly ophthalmology/optometry visit for glaucoma screening and checkup Recommended yearly dental visit for hygiene and checkup  Vaccinations: Influenza vaccine: Due  Pneumococcal vaccine: Completed  Tdap vaccine: Due and discussed Shingles vaccine: Shingrix discussed. Please contact your pharmacy for coverage information.    Covid-19:Completed 2/5/4 & 12/28/19  Advanced directives: Please bring a copy of your health care power of attorney and living will to the office at your convenience.  Conditions/risks identified: Stay healthy  Next appointment: Follow up in one year for your annual wellness visit    Preventive Care 82 Years and Older, Female Preventive care refers to lifestyle choices and visits with your health care provider that can promote health and wellness. What does preventive care include? A yearly physical exam. This is also called an annual well check. Dental exams once or twice a year. Routine eye exams. Ask your health care provider how often you should have your eyes checked. Personal lifestyle choices, including: Daily care of your teeth and gums. Regular physical activity. Eating a healthy diet. Avoiding tobacco and drug use. Limiting alcohol use. Practicing safe sex. Taking low-dose aspirin every day. Taking vitamin and mineral supplements as recommended by your health care provider. What happens during an annual well check? The services and screenings done by your health care provider during your annual well check will depend on your age, overall health, lifestyle risk factors, and family history of  disease. Counseling  Your health care provider may ask you questions about your: Alcohol use. Tobacco use. Drug use. Emotional well-being. Home and relationship well-being. Sexual activity. Eating habits. History of falls. Memory and ability to understand (cognition). Work and work Astronomer. Reproductive health. Screening  You may have the following tests or measurements: Height, weight, and BMI. Blood pressure. Lipid and cholesterol levels. These may be checked every 5 years, or more frequently if you are over 50 years old. Skin check. Lung cancer screening. You may have this screening every year starting at age 82 if you have a 30-pack-year history of smoking and currently smoke or have quit within the past 15 years. Fecal occult blood test (FOBT) of the stool. You may have this test every year starting at age 82. Flexible sigmoidoscopy or colonoscopy. You may have a sigmoidoscopy every 5 years or a colonoscopy every 10 years starting at age 82. Hepatitis C blood test. Hepatitis B blood test. Sexually transmitted disease (STD) testing. Diabetes screening. This is done by checking your blood sugar (glucose) after you have not eaten for a while (fasting). You may have this done every 1-3 years. Bone density scan. This is done to screen for osteoporosis. You may have this done starting at age 82. Mammogram. This may be done every 82-2 years. Talk to your health care provider about how often you should have regular mammograms. Talk with your health care provider about your test results, treatment options, and if necessary, the need for more tests. Vaccines  Your health care provider may recommend certain vaccines, such as: Influenza vaccine. This is recommended every year. Tetanus, diphtheria, and acellular pertussis (Tdap, Td) vaccine. You may need a Td booster every 10 years. Zoster vaccine. You may need  this after age 25. Pneumococcal 13-valent conjugate (PCV13) vaccine. One  dose is recommended after age 82. Pneumococcal polysaccharide (PPSV23) vaccine. One dose is recommended after age 64. Talk to your health care provider about which screenings and vaccines you need and how often you need them. This information is not intended to replace advice given to you by your health care provider. Make sure you discuss any questions you have with your health care provider. Document Released: 11/15/2015 Document Revised: 07/08/2016 Document Reviewed: 08/20/2015 Elsevier Interactive Patient Education  2017 [place] Prevention in the Home Falls can cause injuries. They can happen to people of all ages. There are many things you can do to make your home safe and to help prevent falls. What can I do on the outside of my home? Regularly fix the edges of walkways and driveways and fix any cracks. Remove anything that might make you trip as you walk through a door, such as a raised step or threshold. Trim any bushes or trees on the path to your home. Use bright outdoor lighting. Clear any walking paths of anything that might make someone trip, such as rocks or tools. Regularly check to see if handrails are loose or broken. Make sure that both sides of any steps have handrails. Any raised decks and porches should have guardrails on the edges. Have any leaves, snow, or ice cleared regularly. Use sand or salt on walking paths during winter. Clean up any spills in your garage right away. This includes oil or grease spills. What can I do in the bathroom? Use night lights. Install grab bars by the toilet and in the tub and shower. Do not use towel bars as grab bars. Use non-skid mats or decals in the tub or shower. If you need to sit down in the shower, use a plastic, non-slip stool. Keep the floor dry. Clean up any water that spills on the floor as soon as it happens. Remove soap buildup in the tub or shower regularly. Attach bath mats securely with double-sided  non-slip rug tape. Do not have throw rugs and other things on the floor that can make you trip. What can I do in the bedroom? Use night lights. Make sure that you have a light by your bed that is easy to reach. Do not use any sheets or blankets that are too big for your bed. They should not hang down onto the floor. Have a firm chair that has side arms. You can use this for support while you get dressed. Do not have throw rugs and other things on the floor that can make you trip. What can I do in the kitchen? Clean up any spills right away. Avoid walking on wet floors. Keep items that you use a lot in easy-to-reach places. If you need to reach something above you, use a strong step stool that has a grab bar. Keep electrical cords out of the way. Do not use floor polish or wax that makes floors slippery. If you must use wax, use non-skid floor wax. Do not have throw rugs and other things on the floor that can make you trip. What can I do with my stairs? Do not leave any items on the stairs. Make sure that there are handrails on both sides of the stairs and use them. Fix handrails that are broken or loose. Make sure that handrails are as long as the stairways. Check any carpeting to make sure that it is firmly attached to  the stairs. Fix any carpet that is loose or worn. Avoid having throw rugs at the top or bottom of the stairs. If you do have throw rugs, attach them to the floor with carpet tape. Make sure that you have a light switch at the top of the stairs and the bottom of the stairs. If you do not have them, ask someone to add them for you. What else can I do to help prevent falls? Wear shoes that: Do not have high heels. Have rubber bottoms. Are comfortable and fit you well. Are closed at the toe. Do not wear sandals. If you use a stepladder: Make sure that it is fully opened. Do not climb a closed stepladder. Make sure that both sides of the stepladder are locked into place. Ask  someone to hold it for you, if possible. Clearly mark and make sure that you can see: Any grab bars or handrails. First and last steps. Where the edge of each step is. Use tools that help you move around (mobility aids) if they are needed. These include: Canes. Walkers. Scooters. Crutches. Turn on the lights when you go into a dark area. Replace any light bulbs as soon as they burn out. Set up your furniture so you have a clear path. Avoid moving your furniture around. If any of your floors are uneven, fix them. If there are any pets around you, be aware of where they are. Review your medicines with your doctor. Some medicines can make you feel dizzy. This can increase your chance of falling. Ask your doctor what other things that you can do to help prevent falls. This information is not intended to replace advice given to you by your health care provider. Make sure you discuss any questions you have with your health care provider. Document Released: 08/15/2009 Document Revised: 03/26/2016 Document Reviewed: 11/23/2014 Elsevier Interactive Patient Education  2017 [employer].

## 2021-08-15 ENCOUNTER — Other Ambulatory Visit: Payer: Self-pay | Admitting: Family Medicine

## 2021-08-15 DIAGNOSIS — I10 Essential (primary) hypertension: Secondary | ICD-10-CM

## 2021-09-24 ENCOUNTER — Other Ambulatory Visit: Payer: Self-pay | Admitting: Family Medicine

## 2021-09-24 DIAGNOSIS — E785 Hyperlipidemia, unspecified: Secondary | ICD-10-CM

## 2021-09-29 ENCOUNTER — Telehealth: Payer: Self-pay | Admitting: Family Medicine

## 2021-09-29 NOTE — Telephone Encounter (Signed)
Pt is calling and has an appt sch for 10-31-2021 and would like a refill on atorvastatin (LIPITOR) 10 MG tablet and  clorazepate (TRANXENE) 3.75 MG tablet  [id number] [place], [place] - [address] [phone]

## 2021-10-01 ENCOUNTER — Other Ambulatory Visit: Payer: Self-pay | Admitting: Family Medicine

## 2021-10-01 DIAGNOSIS — E785 Hyperlipidemia, unspecified: Secondary | ICD-10-CM

## 2021-10-01 MED ORDER — CLORAZEPATE DIPOTASSIUM 3.75 MG PO TABS: 3.7500 mg | ORAL_TABLET | Freq: Two times a day (BID) | ORAL | 0 refills | Status: DC | PRN

## 2021-10-01 MED ORDER — ATORVASTATIN CALCIUM 10 MG PO TABS: ORAL_TABLET | ORAL | 0 refills | Status: DC

## 2021-10-01 NOTE — Telephone Encounter (Signed)
Refills sent

## 2021-10-31 ENCOUNTER — Ambulatory Visit (INDEPENDENT_AMBULATORY_CARE_PROVIDER_SITE_OTHER): Payer: Medicare PPO | Admitting: Family Medicine

## 2021-10-31 ENCOUNTER — Encounter: Payer: Self-pay | Admitting: Family Medicine

## 2021-10-31 VITALS — BP 128/64 | HR 69 | Temp 97.5°F | Ht 64.0 in | Wt 183.7 lb

## 2021-10-31 DIAGNOSIS — I480 Paroxysmal atrial fibrillation: Secondary | ICD-10-CM

## 2021-10-31 DIAGNOSIS — Z66 Do not resuscitate: Secondary | ICD-10-CM

## 2021-10-31 DIAGNOSIS — I10 Essential (primary) hypertension: Secondary | ICD-10-CM

## 2021-10-31 DIAGNOSIS — E785 Hyperlipidemia, unspecified: Secondary | ICD-10-CM

## 2021-10-31 DIAGNOSIS — N182 Chronic kidney disease, stage 2 (mild): Secondary | ICD-10-CM | POA: Diagnosis not present

## 2021-10-31 DIAGNOSIS — L989 Disorder of the skin and subcutaneous tissue, unspecified: Secondary | ICD-10-CM | POA: Diagnosis not present

## 2021-10-31 DIAGNOSIS — R739 Hyperglycemia, unspecified: Secondary | ICD-10-CM | POA: Diagnosis not present

## 2021-10-31 LAB — CBC WITH DIFFERENTIAL/PLATELET
Basophils Absolute: 0 10*3/uL (ref 0.0–0.1)
Basophils Relative: 0.4 % (ref 0.0–3.0)
Eosinophils Absolute: 0.2 10*3/uL (ref 0.0–0.7)
Eosinophils Relative: 3.2 % (ref 0.0–5.0)
HCT: 43.3 % (ref 36.0–46.0)
Hemoglobin: 14.3 g/dL (ref 12.0–15.0)
Lymphocytes Relative: 27.3 % (ref 12.0–46.0)
Lymphs Abs: 1.8 10*3/uL (ref 0.7–4.0)
MCHC: 33 g/dL (ref 30.0–36.0)
MCV: 92.3 fl (ref 78.0–100.0)
Monocytes Absolute: 0.6 10*3/uL (ref 0.1–1.0)
Monocytes Relative: 9.3 % (ref 3.0–12.0)
Neutro Abs: 4 10*3/uL (ref 1.4–7.7)
Neutrophils Relative %: 59.8 % (ref 43.0–77.0)
Platelets: 176 10*3/uL (ref 150.0–400.0)
RBC: 4.69 Mil/uL (ref 3.87–5.11)
RDW: 13.3 % (ref 11.5–15.5)
WBC: 6.7 10*3/uL (ref 4.0–10.5)

## 2021-10-31 LAB — LIPID PANEL
Cholesterol: 121 mg/dL (ref 0–200)
HDL: 40.8 mg/dL (ref >39.00)
LDL Cholesterol: 43 mg/dL (ref 0–99)
NonHDL: 80.49
Total CHOL/HDL Ratio: 3
Triglycerides: 188 mg/dL — ABNORMAL HIGH (ref 0.0–149.0)
VLDL: 37.6 mg/dL (ref 0.0–40.0)

## 2021-10-31 LAB — COMPREHENSIVE METABOLIC PANEL
ALT: 15 U/L (ref 0–35)
AST: 20 U/L (ref 0–37)
Albumin: 3.9 g/dL (ref 3.5–5.2)
Alkaline Phosphatase: 48 U/L (ref 39–117)
BUN: 28 mg/dL — ABNORMAL HIGH (ref 6–23)
CO2: 33 mEq/L — ABNORMAL HIGH (ref 19–32)
Calcium: 9.7 mg/dL (ref 8.4–10.5)
Chloride: 98 mEq/L (ref 96–112)
Creatinine, Ser: 0.99 mg/dL (ref 0.40–1.20)
GFR: 53.09 mL/min — ABNORMAL LOW (ref >60.00)
Glucose, Bld: 87 mg/dL (ref 70–99)
Potassium: 4.6 mEq/L (ref 3.5–5.1)
Sodium: 137 mEq/L (ref 135–145)
Total Bilirubin: 0.4 mg/dL (ref 0.2–1.2)
Total Protein: 7.1 g/dL (ref 6.0–8.3)

## 2021-10-31 LAB — HEMOGLOBIN A1C: Hgb A1c MFr Bld: 5.5 % (ref 4.6–6.5)

## 2021-10-31 MED ORDER — SHINGRIX 50 MCG/0.5ML IM SUSR: 0.5000 mL | Freq: Once | INTRAMUSCULAR | 0 refills | Status: AC

## 2021-10-31 NOTE — Progress Notes (Signed)
Tammy Barron DOB: 01-Jan-1941 Encounter date: 10/31/2021  This is a 80 y.o. female who presents with Chief Complaint  Patient presents with   Follow-up    History of present illness: No specific worries/concerns today.   HTN: doesn't check often at home. No issues with light headed,dizzy, headaches. Can't get head down below knees since stroke. She will use grabbers to pick things up if needed.   PAF: HR steady.   No planned exercise. Just does things she needs to do around house.   Mood has been ok. Doesn't feel that there is any worsening of anxiety. Does have family/children near by. Visits with them regularly.   Allergies  Allergen Reactions   Latex Rash   Current Meds  Medication Sig   acetaminophen (TYLENOL) 500 MG tablet Take 500 mg by mouth every 6 (six) hours as needed for mild pain, moderate pain or headache.    atorvastatin (LIPITOR) 10 MG tablet TAKE 1 TABLET BY MOUTH EVERY DAY AT NIGHT   bisoprolol-hydrochlorothiazide (ZIAC) 10-6.25 MG tablet TAKE 1 TABLET BY MOUTH EVERY DAY   clorazepate (TRANXENE) 3.75 MG tablet Take 1 tablet (3.75 mg total) by mouth 2 (two) times daily as needed for anxiety.   Multiple Vitamin (MULTIVITAMIN) tablet Take 1 tablet by mouth daily.   PRADAXA 150 MG CAPS capsule TAKE 1 CAPSULE BY MOUTH TWICE A DAY   [EXPIRED] Zoster Vaccine Adjuvanted [facility]) injection Inject 0.5 mLs into the muscle once for 1 dose. Repeat in 2-6 months    Review of Systems  Constitutional:  Negative for chills, fatigue and fever.  Respiratory:  Negative for cough, chest tightness, shortness of breath and wheezing.   Cardiovascular:  Negative for chest pain, palpitations and leg swelling.   Objective:  BP 128/64 (BP Location: Left Arm, Patient Position: Sitting, Cuff Size: Large)    Pulse 69    Temp (!) 97.5 F (36.4 C) (Oral)    Ht 5\' 4"  (1.626 m)    Wt 183 lb 11.2 oz (83.3 kg)    SpO2 95%    BMI 31.53 kg/m   Weight: 183 lb 11.2 oz (83.3 kg)   BP  Readings from Last 3 Encounters:  10/31/21 128/64  03/10/21 128/68  06/21/20 112/80   Wt Readings from Last 3 Encounters:  10/31/21 183 lb 11.2 oz (83.3 kg)  03/10/21 185 lb 6.4 oz (84.1 kg)  06/21/20 178 lb 1.6 oz (80.8 kg)    Physical Exam Constitutional:      General: She is not in acute distress.    Appearance: She is well-developed.  Cardiovascular:     Rate and Rhythm: Normal rate and regular rhythm.     Heart sounds: Normal heart sounds. No murmur heard.   No friction rub.  Pulmonary:     Effort: Pulmonary effort is normal. No respiratory distress.     Breath sounds: Normal breath sounds. No wheezing or rales.  Musculoskeletal:     Right lower leg: No edema.     Left lower leg: No edema.  Skin:    Comments: Multiple scaling lesions of the forearm and hand.  Some erythematous small papules or scaled lesions.  She did not follow through with seeing the dermatologist when last referred (was concerned about negative feedback that she had heard from friends).  Will refer to different dermatologist this time.  Neurological:     Mental Status: She is alert and oriented to person, place, and time.  Psychiatric:  Behavior: Behavior normal.    Assessment/Plan  Will consider prevnar 20 at next visit  1. Dyslipidemia Continue with Lipitor 10 mg daily. - Comprehensive metabolic panel; Future - Lipid panel; Future - Lipid panel - Comprehensive metabolic panel  2. CKD (chronic kidney disease) stage 2, GFR 60-89 ml/min We will recheck blood work today.  3. AF (paroxysmal atrial fibrillation) (HCC) Continue current Pradaxa.  Rate controlled  4. Essential hypertension Pressures well controlled on bisoprolol-hydrochlorothiazide 10-6.25 mg daily. - CBC with Differential/Platelet; Future - Comprehensive metabolic panel; Future - Comprehensive metabolic panel - CBC with Differential/Platelet  5. Hyperglycemia - Hemoglobin A1c; Future - Hemoglobin A1c  6. DNR (do  not resuscitate) Discussed DNR status today.  DNR form was completed for patient.  She does not wish to be resuscitated in the event of cardio/pulmonary arrest.  7. Skin lesions - Ambulatory referral to Dermatology    Theodis Shove, MD

## 2021-11-07 ENCOUNTER — Other Ambulatory Visit: Payer: Self-pay | Admitting: Family Medicine

## 2021-11-07 DIAGNOSIS — E785 Hyperlipidemia, unspecified: Secondary | ICD-10-CM

## 2021-11-11 ENCOUNTER — Other Ambulatory Visit: Payer: Self-pay | Admitting: Family Medicine

## 2021-11-11 DIAGNOSIS — I10 Essential (primary) hypertension: Secondary | ICD-10-CM

## 2021-11-17 ENCOUNTER — Other Ambulatory Visit: Payer: Self-pay | Admitting: Family Medicine

## 2021-12-09 DIAGNOSIS — L578 Other skin changes due to chronic exposure to nonionizing radiation: Secondary | ICD-10-CM | POA: Diagnosis not present

## 2021-12-09 DIAGNOSIS — D225 Melanocytic nevi of trunk: Secondary | ICD-10-CM | POA: Diagnosis not present

## 2021-12-09 DIAGNOSIS — L570 Actinic keratosis: Secondary | ICD-10-CM | POA: Diagnosis not present

## 2021-12-09 DIAGNOSIS — L218 Other seborrheic dermatitis: Secondary | ICD-10-CM | POA: Diagnosis not present

## 2021-12-09 DIAGNOSIS — L821 Other seborrheic keratosis: Secondary | ICD-10-CM | POA: Diagnosis not present

## 2021-12-09 DIAGNOSIS — C44622 Squamous cell carcinoma of skin of right upper limb, including shoulder: Secondary | ICD-10-CM | POA: Diagnosis not present

## 2021-12-09 DIAGNOSIS — L814 Other melanin hyperpigmentation: Secondary | ICD-10-CM | POA: Diagnosis not present

## 2021-12-09 DIAGNOSIS — D492 Neoplasm of unspecified behavior of bone, soft tissue, and skin: Secondary | ICD-10-CM | POA: Diagnosis not present

## 2022-01-16 ENCOUNTER — Other Ambulatory Visit: Payer: Self-pay | Admitting: Family Medicine

## 2022-01-27 ENCOUNTER — Other Ambulatory Visit: Payer: Self-pay | Admitting: Family Medicine

## 2022-02-18 ENCOUNTER — Other Ambulatory Visit: Payer: Self-pay | Admitting: Family Medicine

## 2022-02-18 DIAGNOSIS — E785 Hyperlipidemia, unspecified: Secondary | ICD-10-CM

## 2022-02-18 DIAGNOSIS — I10 Essential (primary) hypertension: Secondary | ICD-10-CM

## 2022-02-20 ENCOUNTER — Other Ambulatory Visit: Payer: Self-pay | Admitting: Family Medicine

## 2022-03-10 ENCOUNTER — Ambulatory Visit: Payer: Medicare PPO | Admitting: Student

## 2022-03-10 NOTE — Progress Notes (Deleted)
Primary Physician/Referring:  Caren Macadam, MD  Patient ID: Tammy Barron, female    DOB: [birth year], 81 y.o.   MRN: [id number]  No chief complaint on file.  HPI:    Tammy Barron  is a 81 y.o. female with paroxysmal atrial fibrillation and carotid stenosis (asymptomatic). She has h/o right brain CVA with occipital infarct and mild right eye visual defect. She was noted to have paroxysmal A. fib during hospitalization in 04/2014 for CVA, she was started on Pradaxa at this time.  Patient lives alone and is relatively inactive.  Patient presents for 1 year follow-up of carotid artery stenosis, PAF, and hypertension.  At last office visit patient had opted not to continue with regular surveillance of carotid artery stenosis.  Patient also opted to continue on Pradaxa rather than switching to Eliquis or Xarelto, no changes were made to medications at that time and shared decision was to follow-up annually. ***  ***lipids controlled.   Patient was last seen in the office 01/09/2019 by Dr. Einar Gip who had recommended 39-month follow-up at that time.  Unfortunately patient has been lost to follow-up until now, repeatedly no showing appointments with our office.  She now presents for follow-up of paroxysmal atrial fibrillation and carotid artery stenosis.  Patient is without specific complaints today.  Although she is frustrated as during her recent carotid ultrasound she became dizzy and was unable to complete the study. Denies chest pain, palpitations, dyspnea, syncope, near-syncope, symptoms suggestive of CVA/TIA. She continues to tolerate anticoagulation without bleeding diathesis. Patient keeps her house and lives alone, but is relatively inactive otherwise.   Patient made it clear today that she does not want to pursue aggressive medical intervention including additional carotid imagining. Patient reports she has completed advance care planning paperwork with PCP office.   Past Medical  History:  Diagnosis Date   Anemia    Anxiety    Atrial fibrillation (HCC)    Carotid stenosis, right 01/09/2019   Depression    Hx of abnormal Pap smear    Hyperlipidemia    Hypertension    Peripheral artery disease ([place]) 01/09/2019   Past Surgical History:  Procedure Laterality Date   ABDOMINAL HYSTERECTOMY  1982   ANKLE ARTHODESIS W/ ARTHROSCOPY     Family History  Problem Relation Age of Onset   Heart disease Mother    Heart attack Father    Cancer Sister    Hypertension Maternal Aunt     Social History   Tobacco Use   Smoking status: Some Days    Packs/day: 0.25    Years: 40.00    Pack years: 10.00    Types: Cigarettes    Last attempt to quit: 11/10/2018    Years since quitting: 3.3   Smokeless tobacco: Never   Tobacco comments:    3-4 per day  Substance Use Topics   Alcohol use: No    Alcohol/week: 0.0 standard drinks   Marital Status: Widowed   ROS  Review of Systems  Constitutional: Negative for malaise/fatigue and weight gain.  Eyes:  Positive for blurred vision (cataract).  Cardiovascular:  Negative for chest pain, claudication, leg swelling, near-syncope, orthopnea, palpitations, paroxysmal nocturnal dyspnea and syncope.  Respiratory:  Negative for shortness of breath.   Hematologic/Lymphatic: Does not bruise/bleed easily.  Gastrointestinal:  Negative for melena.  Neurological:  Negative for dizziness and weakness.   Objective  There were no vitals taken for this visit.     10/31/2021    1:37  PM 03/10/2021    1:52 PM 06/21/2020    4:02 PM  Vitals with BMI  Height 5\' 4"  5\' 4"    Weight 183 lbs 11 oz 185 lbs 6 oz 178 lbs 2 oz  BMI [id number]   Systolic 0000000 [id number]  Diastolic 64 68 80  Pulse 69 66 66      Physical Exam Vitals reviewed.  Constitutional:      Appearance: She is obese.     Comments: Ambulates with cane   HENT:     Head: Normocephalic and atraumatic.  Cardiovascular:     Rate and Rhythm: Normal rate and regular rhythm.      Pulses: Intact distal pulses.          Carotid pulses are  on the right side with bruit and  on the left side with bruit.    Heart sounds: S1 normal and S2 normal. No murmur heard.   No gallop.  Pulmonary:     Effort: Pulmonary effort is normal. No respiratory distress.     Breath sounds: No wheezing, rhonchi or rales.  Musculoskeletal:     Right lower leg: No edema.     Left lower leg: No edema.  Neurological:     Mental Status: She is alert.    Laboratory examination:   Recent Labs    10/31/21 1424  NA 137  K 4.6  CL 98  CO2 33*  GLUCOSE 87  BUN 28*  CREATININE 0.99  CALCIUM 9.7    CrCl cannot be calculated (Patient's most recent lab result is older than the maximum 21 days allowed.).     Latest Ref Rng & Units 10/31/2021    2:24 PM 06/03/2020    1:02 PM 08/02/2019    5:48 AM  CMP  Glucose 70 - 99 mg/dL 87   102   121    BUN 6 - 23 mg/dL 28   17   15     Creatinine 0.40 - 1.20 mg/dL 0.99   0.94   0.73    Sodium 135 - 145 mEq/L 137   139   132    Potassium 3.5 - 5.1 mEq/L 4.6   4.2   3.3    Chloride 96 - 112 mEq/L 98   102   100    CO2 19 - 32 mEq/L 33   33   24    Calcium 8.4 - 10.5 mg/dL 9.7   9.3   8.5    Total Protein 6.0 - 8.3 g/dL 7.1   6.6   6.2    Total Bilirubin 0.2 - 1.2 mg/dL 0.4   0.5   0.9    Alkaline Phos 39 - 117 U/L 48    38    AST 0 - 37 U/L 20   19   26     ALT 0 - 35 U/L 15   12   16         Latest Ref Rng & Units 10/31/2021    2:24 PM 06/03/2020    1:02 PM 08/02/2019    5:48 AM  CBC  WBC 4.0 - 10.5 K/uL 6.7   7.0   9.0    Hemoglobin 12.0 - 15.0 g/dL 14.3   14.9   12.4    Hematocrit 36.0 - 46.0 % 43.3   44.7   38.7    Platelets 150.0 - 400.0 K/uL 176.0   165   127      Lipid Panel Recent Labs  10/31/21 1424  CHOL 121  TRIG 188.0*  LDLCALC 43  VLDL 37.6  HDL 40.80  CHOLHDL 3     HEMOGLOBIN A1C Lab Results  Component Value Date   HGBA1C 5.5 10/31/2021   MPG 105 02/01/2014   TSH No results for input(s): TSH in the last 8760  hours.  External labs:  None   Allergies   Allergies  Allergen Reactions   Latex Rash    Medications Prior to Visit:   Outpatient Medications Prior to Visit  Medication Sig Dispense Refill   traMADol (ULTRAM) 50 MG tablet Take 1 tablet (50 mg total) by mouth every 8 (eight) hours as needed for moderate pain. for pain 40 tablet 0   acetaminophen (TYLENOL) 500 MG tablet Take 500 mg by mouth every 6 (six) hours as needed for mild pain, moderate pain or headache.      atorvastatin (LIPITOR) 10 MG tablet TAKE 1 TABLET BY MOUTH EVERY DAY AT NIGHT 90 tablet 0   bisoprolol-hydrochlorothiazide (ZIAC) 10-6.25 MG tablet TAKE 1 TABLET BY MOUTH EVERY DAY 90 tablet 0   clorazepate (TRANXENE) 3.75 MG tablet TAKE 1 TABLET (3.75 MG TOTAL) BY MOUTH 2 (TWO) TIMES DAILY AS NEEDED FOR ANXIETY. 60 tablet 2   Multiple Vitamin (MULTIVITAMIN) tablet Take 1 tablet by mouth daily.     PRADAXA 150 MG CAPS capsule TAKE 1 CAPSULE BY MOUTH TWICE A DAY 180 capsule 3   No facility-administered medications prior to visit.   Final Medications at End of Visit    No outpatient medications have been marked as taking for the 03/10/22 encounter (Appointment) with Rayetta Pigg, Jayion Schneck C, PA-C.   Radiology:   No results found.  Cardiac Studies:   Echocardiogram   TTE - 02/01/2014 -  EF 55% to 60% - wall motion was normal - grade 1 diastolic dysfunction - no evidence of embolic source  Carotid artery duplex 01/03/2021:  Stenosis in the right internal carotid artery (>=70%) due to heterogenous plaque, ICA to CCA ratio 6.2.  No significant stenosis within the right common carotid artery.  Stenosis in the right ECA and right carotid bifurcation <50% due to heterogenous plaque.  Left ICA not visualized due to patient discomfort.  Peak systolic velocities in the left common carotid, external carotid arteries and the carotid bifurcations within normal limits.  Bilateral vertebral arteries not visualized.  Compared to prior  study dated 04/07/2020: Right ICA stenosis remained stable; however, ICA to CCA ratio has increased from 4.97 to 6.2.  Left ICA stenosis was noted to be 50-69% stenosis; however, it was not studied as a part of this duplex.  Recommendations: May consider CTA or MRA to further evaluate bilateral internal carotid artery stenosis given the findings above.  Clinical correlation is required.  Follow-up study in 6 months is appropriate if clinically indicated.  EKG:  ***   EKG 03/10/2021: Sinus rhythm with first-degree AV block at a rate of 65 bpm.  Normal axis.  Incomplete right bundle branch block.  Poor R wave progression, cannot exclude anteroseptal infarct old.  Low voltage complexes, consider pulmonary disease pattern.  No evidence of ischemia or underlying injury pattern.  Compared to EKG 01/09/2019, no significant change.  EKG 01/09/2019: Sinus rhythm with first-degree AV block at the rate of 63 bpm, borderline criteria for left atrial enlargement, otherwise normal EKG. No significant change from  EKG 06/14/2017.  Assessment   No diagnosis found.    There are no discontinued medications.   No orders of the  defined types were placed in this encounter. This patients CHA2DS2-VASc Score 7 (CVA, F, Age, HTN, vasc) and yearly risk of stroke >9.8%.   Recommendations:   Tammy Barron is a 81 y.o.  female with paroxysmal atrial fibrillation and carotid stenosis (asymptomatic), as well as hyperlipidemia, hypertension, PAD. She has h/o right brain CVA with occipital infarct and mild right eye visual defect. She was noted to have paroxysmal A. fib during hospitalization in 04/2014 for CVA, she was started on Pradaxa at this time.  Patient lives alone and is relatively inactive.  Patient presents for 1 year follow-up of carotid artery stenosis, PAF, and hypertension.  At last office visit patient had opted not to continue with regular surveillance of carotid artery stenosis.  Patient also opted to  continue on Pradaxa rather than switching to Eliquis or Xarelto, no changes were made to medications at that time and shared decision was to follow-up annually. ***  ***  Patient was last seen in the office 01/09/2019 by Dr. Einar Gip who had recommended 38-month follow-up at that time.  Unfortunately patient has been lost to follow-up until now, repeatedly no showing appointments with our office.  She now presents for follow-up of paroxysmal atrial fibrillation and carotid artery stenosis.  Reviewed and discussed with patient regarding recent carotid artery duplex which reveals progression of carotid artery stenosis.  Discussed with patient proceeding with further imaging either CTA or MRA to further evaluate bilateral internal carotid artery stenosis.  However patient prefers not to pursue additional testing, despite discussion of risks versus benefits.  Shared decision was therefore to hold off on further evaluation of carotid artery stenosis and instead continue with medical therapy.  Patient has made it very clear she does not wish to be considered for surgical intervention should the indication present itself even.  Blood pressure was initially elevated in the office, however upon recheck it is well controlled.  We will therefore not make changes to antihypertensive medications at this time.  Patient continues to tolerate Pradaxa without bleeding diathesis, and continues to wish not to switch to Eliquis or Xarelto.  I personally reviewed external labs, lipids are well controlled with the exception of elevated triglycerides and renal function remained stable.  Defer further management of hyperlipidemia to PCP.  Patient denies symptoms of claudication.  No changes were made to patient's medications at this time.  Shared decision was to hold off on pursuing further evaluation or consideration of vascular surgery intervention for carotid artery stenosis.  Patient also prefers to follow-up on an annual basis  rather than every 6 months.  Advised patient regarding signs and symptoms that would warrant more urgent evaluation, she verbalized understanding agreement.  Follow-up in 1 year, sooner if needed, for carotid artery stenosis, paroxysmal atrial fibrillation, and hypertension.   Alethia Berthold, PA-C 03/10/2022, 12:56 PM Office: [phone]

## 2022-03-23 ENCOUNTER — Other Ambulatory Visit: Payer: Self-pay | Admitting: Family Medicine

## 2022-03-23 DIAGNOSIS — Z8673 Personal history of transient ischemic attack (TIA), and cerebral infarction without residual deficits: Secondary | ICD-10-CM

## 2022-03-23 DIAGNOSIS — I480 Paroxysmal atrial fibrillation: Secondary | ICD-10-CM

## 2022-04-22 ENCOUNTER — Other Ambulatory Visit: Payer: Self-pay | Admitting: Family Medicine

## 2022-04-22 MED ORDER — CLORAZEPATE DIPOTASSIUM 3.75 MG PO TABS: 3.7500 mg | ORAL_TABLET | Freq: Two times a day (BID) | ORAL | 2 refills | Status: DC | PRN

## 2022-04-22 NOTE — Telephone Encounter (Signed)
Pt requesting refill clorazepate (TRANXENE) 3.75 MG tablet and that she only want the 3.75, states they gave her 7.5mg  last fill and she had a difficult time cutting the pills in half.

## 2022-05-26 ENCOUNTER — Ambulatory Visit: Payer: Medicare PPO | Admitting: Family Medicine

## 2022-06-01 ENCOUNTER — Other Ambulatory Visit: Payer: Self-pay | Admitting: *Deleted

## 2022-06-01 DIAGNOSIS — E785 Hyperlipidemia, unspecified: Secondary | ICD-10-CM

## 2022-06-02 ENCOUNTER — Other Ambulatory Visit: Payer: Self-pay | Admitting: *Deleted

## 2022-06-02 DIAGNOSIS — E785 Hyperlipidemia, unspecified: Secondary | ICD-10-CM

## 2022-06-02 MED ORDER — ATORVASTATIN CALCIUM 10 MG PO TABS: ORAL_TABLET | ORAL | 0 refills | Status: DC

## 2022-06-02 NOTE — Telephone Encounter (Signed)
Ok to refill but she hasn't been seen since Dec 2022, please schedule her an appt with me. Thanks!

## 2022-06-04 ENCOUNTER — Other Ambulatory Visit: Payer: Self-pay | Admitting: *Deleted

## 2022-06-04 DIAGNOSIS — I480 Paroxysmal atrial fibrillation: Secondary | ICD-10-CM

## 2022-06-04 DIAGNOSIS — Z8673 Personal history of transient ischemic attack (TIA), and cerebral infarction without residual deficits: Secondary | ICD-10-CM

## 2022-06-05 MED ORDER — DABIGATRAN ETEXILATE MESYLATE 150 MG PO CAPS: 150.0000 mg | ORAL_CAPSULE | Freq: Two times a day (BID) | ORAL | 0 refills | Status: DC

## 2022-06-05 NOTE — Telephone Encounter (Signed)
I will fill but she needs an appt for follow up.

## 2022-06-29 ENCOUNTER — Other Ambulatory Visit: Payer: Self-pay | Admitting: *Deleted

## 2022-06-29 DIAGNOSIS — I10 Essential (primary) hypertension: Secondary | ICD-10-CM

## 2022-06-29 MED ORDER — BISOPROLOL-HYDROCHLOROTHIAZIDE 10-6.25 MG PO TABS: 1.0000 | ORAL_TABLET | Freq: Every day | ORAL | 0 refills | Status: DC

## 2022-07-10 ENCOUNTER — Ambulatory Visit: Payer: Medicare PPO | Admitting: Family Medicine

## 2022-07-10 ENCOUNTER — Encounter: Payer: Self-pay | Admitting: Family Medicine

## 2022-07-10 VITALS — BP 100/58 | HR 62 | Temp 98.2°F | Ht 62.0 in | Wt 184.9 lb

## 2022-07-10 DIAGNOSIS — I10 Essential (primary) hypertension: Secondary | ICD-10-CM

## 2022-07-10 DIAGNOSIS — Z8673 Personal history of transient ischemic attack (TIA), and cerebral infarction without residual deficits: Secondary | ICD-10-CM

## 2022-07-10 DIAGNOSIS — N3941 Urge incontinence: Secondary | ICD-10-CM

## 2022-07-10 DIAGNOSIS — Z9181 History of falling: Secondary | ICD-10-CM

## 2022-07-10 MED ORDER — OXYBUTYNIN CHLORIDE 5 MG PO TABS
5.0000 mg | ORAL_TABLET | Freq: Three times a day (TID) | ORAL | 2 refills | Status: DC | PRN
Start: 2022-07-10 — End: 2023-09-06

## 2022-07-10 NOTE — Progress Notes (Addendum)
Established Patient Office Visit  Subjective   Patient ID: Tammy Barron, female    DOB: [birth year]  Age: 81 y.o. MRN: [id number]  Chief Complaint  Patient presents with   Establish Care    Patient is here for transition of care visit. Patient states she has gained a lot of weight. Patient reports her activity level has decreased since she fell and had a pubic rami fracture in 2020. States that since then she feels like she has a fear that she is going to fall without using an assistive device. She also reports that her eyesight is deteriorating. States she does see the eye doctor and wears glasses, she has not been diagnosed with retinal issues. She reports no recent falls in the last year, however she is always holding onto things while she walks.   HTN -- BP in office performed and is well controlled. She reports no side effects to the medications, no chest pain, SOB, dizziness or headaches. She has a BP cuff at home and is checking her BP regularly, reports they are in the normal range.    Increase urinary frequency-- pt is reporting this also as a new symptom. She states that she is having increasing urge to use the bathroom, she reports no dysuria, states that she has to wake up at night to urinate also and this is interfering with her sleep. Has trouble anytime she travels as well.    Current Outpatient Medications  Medication Instructions   acetaminophen (TYLENOL) 500 mg, Oral, Every 6 hours PRN   atorvastatin (LIPITOR) 10 MG tablet TAKE 1 TABLET BY MOUTH EVERY DAY AT NIGHT   bisoprolol-hydrochlorothiazide (ZIAC) 10-6.25 MG tablet 1 tablet, Oral, Daily   clorazepate (TRANXENE) 3.75 mg, Oral, 2 times daily PRN   dabigatran (PRADAXA) 150 mg, Oral, 2 times daily   Multiple Vitamin (MULTIVITAMIN) tablet 1 tablet, Daily   oxybutynin (DITROPAN) 5 mg, Oral, Every 8 hours PRN   traMADol (ULTRAM) 50 mg, Oral, Every 8 hours PRN, for pain    Patient Active Problem List   Diagnosis  Date Noted   Fall 08/01/2019   H/O: CVA (cerebrovascular accident) 08/01/2019   CKD (chronic kidney disease) stage 2, GFR 60-89 ml/min 08/01/2019   mild Hyponatremia 08/01/2019   Chronic anticoagulation 08/01/2019   Carotid stenosis, right 01/09/2019   Peripheral artery disease (HCC) 01/09/2019   Laboratory examination 01/09/2019   Ataxia, late effect of cerebrovascular disease 03/23/2014   Urination frequency 02/14/2014   CVA (cerebral infarction) 02/06/2014   Thrombocytopenia (HCC) 02/06/2014   AF (paroxysmal atrial fibrillation) (HCC) 02/05/2014   Hypernatremia, induced 02/05/2014   Obesity, unspecified 02/05/2014   Tobacco use disorder 02/05/2014   Obstructive hydrocephalus (HCC) 02/01/2014   Cerebellar stroke (HCC) 01/31/2014   ABDOMINAL PAIN 08/22/2009   KNEE PAIN, LEFT, CHRONIC 02/21/2008   Dyslipidemia 12/07/2007   Anxiety 12/07/2007   DEPRESSION 12/07/2007   Essential hypertension 12/07/2007      Review of Systems  Respiratory:  Negative for shortness of breath.   All other systems reviewed and are negative.     Objective:     BP (!) 100/58 (BP Location: Left Arm, Patient Position: Sitting, Cuff Size: Large)   Pulse 62   Temp 98.2 F (36.8 C) (Oral)   Ht 5\' 2"  (1.575 m)   Wt 184 lb 14.4 oz (83.9 kg)   SpO2 100%   BMI 33.82 kg/m  BP Readings from Last 3 Encounters:  07/10/22 (!) 100/58  10/31/21 128/64  03/10/21 128/68   Wt Readings from Last 3 Encounters:  07/10/22 184 lb 14.4 oz (83.9 kg)  10/31/21 183 lb 11.2 oz (83.3 kg)  03/10/21 185 lb 6.4 oz (84.1 kg)      Physical Exam Vitals reviewed.  Constitutional:      Appearance: Normal appearance. She is well-groomed. She is obese.  Eyes:     Extraocular Movements: Extraocular movements intact.     Conjunctiva/sclera: Conjunctivae normal.  Cardiovascular:     Rate and Rhythm: Normal rate and regular rhythm.     Pulses: Normal pulses.     Heart sounds: S1 normal and S2 normal.  Pulmonary:      Effort: Pulmonary effort is normal.     Breath sounds: Normal breath sounds and air entry.  Abdominal:     General: Bowel sounds are normal.  Musculoskeletal:        General: Normal range of motion.     Cervical back: Normal range of motion and neck supple.     Right lower leg: No edema.     Left lower leg: No edema.  Skin:    General: Skin is warm and dry.  Neurological:     Mental Status: She is alert and oriented to person, place, and time. Mental status is at baseline.     Gait: Gait is intact.  Psychiatric:        Mood and Affect: Mood and affect normal.        Speech: Speech normal.        Behavior: Behavior normal.        Judgment: Judgment normal.      No results found for any visits on 07/10/22.     The ASCVD Risk score (Arnett DK, et al., 2019) failed to calculate for the following reasons:   The 2019 ASCVD risk score is only valid for ages 36 to 20   The patient has a prior MI or stroke diagnosis    Assessment & Plan:   Problem List Items Addressed This Visit       Cardiovascular and Mediastinum   Essential hypertension - Primary    Current hypertension medications:       Sig   bisoprolol-hydrochlorothiazide (ZIAC) 10-6.25 MG tablet (Taking) Take 1 tablet by mouth daily.  BP well controlled on the ziac, will continue as prescribed above.        Other   H/O: CVA (cerebrovascular accident)    With increased risk of falls, I recommended home health referral for physical therapy at home and she is agreeable.  Order placed. Patient continues to report gait abnormalities, requires assistive devices in order to ambulate safely, she struggles with weakness and needs physical therapy in order to increase endurance and strength.       Relevant Orders   Ambulatory referral to Home Health   Other Visit Diagnoses     At risk for fall due to comorbid condition       Relevant Orders   Ambulatory referral to Home Health   Urge incontinence       Relevant  Medications   oxybutynin (DITROPAN) 5 MG tablet  Will treat with PRN oxybutinin, I discussed the risks/benefits of the medication, advised she use it as needed only.      Return in about 6 months (around 01/08/2023) for follow up on HTN.    Karie Georges, MD

## 2022-07-14 NOTE — Assessment & Plan Note (Signed)
Current hypertension medications:      Sig   bisoprolol-hydrochlorothiazide (ZIAC) 10-6.25 MG tablet (Taking) Take 1 tablet by mouth daily.     BP well controlled on the ziac, will continue as prescribed above.

## 2022-07-14 NOTE — Assessment & Plan Note (Signed)
With increased risk of falls, I recommended home health referral for physical therapy at home and she is agreeable.  Order placed.

## 2022-07-23 ENCOUNTER — Telehealth: Payer: Self-pay | Admitting: Family Medicine

## 2022-07-23 DIAGNOSIS — Z8673 Personal history of transient ischemic attack (TIA), and cerebral infarction without residual deficits: Secondary | ICD-10-CM

## 2022-07-23 NOTE — Telephone Encounter (Signed)
Updated referral has been placed

## 2022-07-23 NOTE — Telephone Encounter (Signed)
Deborra Medina Informed note has been addended

## 2022-07-23 NOTE — Telephone Encounter (Signed)
Deborra Medina PT with [facility] is calling and office visit note from 07-10-2022 just mentioned past CVA it needs  to said something like pt continue to struggle with weakness etc. Please addend office visit Deborra Medina has access to Epic.

## 2022-07-23 NOTE — Telephone Encounter (Signed)
Note addended

## 2022-07-23 NOTE — Telephone Encounter (Signed)
Home health referral states pt is healthy.  Dr. Legrand Como needs to state on the referral that you need p/t for balance and stability.

## 2022-07-23 NOTE — Telephone Encounter (Signed)
Can you fix the referral for me? Thanks! Ok to put balance and stability.

## 2022-08-03 ENCOUNTER — Other Ambulatory Visit: Payer: Self-pay | Admitting: Family Medicine

## 2022-08-03 DIAGNOSIS — E785 Hyperlipidemia, unspecified: Secondary | ICD-10-CM

## 2022-08-04 ENCOUNTER — Other Ambulatory Visit: Payer: Self-pay | Admitting: *Deleted

## 2022-08-04 DIAGNOSIS — M25562 Pain in left knee: Secondary | ICD-10-CM

## 2022-08-05 MED ORDER — TRAMADOL HCL 50 MG PO TABS: 50.0000 mg | ORAL_TABLET | Freq: Three times a day (TID) | ORAL | 0 refills | Status: DC | PRN

## 2022-08-12 ENCOUNTER — Ambulatory Visit: Payer: Medicare PPO

## 2022-08-12 ENCOUNTER — Ambulatory Visit (INDEPENDENT_AMBULATORY_CARE_PROVIDER_SITE_OTHER): Payer: Medicare PPO

## 2022-08-12 VITALS — Ht 62.0 in | Wt 184.0 lb

## 2022-08-12 DIAGNOSIS — Z0000 Encounter for general adult medical examination without abnormal findings: Secondary | ICD-10-CM

## 2022-08-12 NOTE — Patient Instructions (Addendum)
Tammy Barron , Thank you for taking time to come for your Medicare Wellness Visit. I appreciate your ongoing commitment to your health goals. Please review the following plan we discussed and let me know if I can assist you in the future.   These are the goals we discussed:  Goals       Patient Stated      To lives your life!       Patient Stated (pt-stated)      Stay healthy.       Quit Smoking        This is a list of the screening recommended for you and due dates:  Health Maintenance  Topic Date Due   COVID-19 Vaccine (5 - Pfizer risk series) 08/28/2022*   Flu Shot  02/01/2023*   Tetanus Vaccine  07/11/2031   Pneumonia Vaccine  Completed   Zoster (Shingles) Vaccine  Completed   HPV Vaccine  Aged Out  *Topic was postponed. The date shown is not the original due date.   Opioid Pain Medicine Management Opioids are powerful medicines that are used to treat moderate to severe pain. When used for short periods of time, they can help you to: Sleep better. Do better in physical or occupational therapy. Feel better in the first few days after an injury. Recover from surgery. Opioids should be taken with the supervision of a trained health care provider. They should be taken for the shortest period of time possible. This is because opioids can be addictive, and the longer you take opioids, the greater your risk of addiction. This addiction can also be called opioid use disorder. What are the risks? Using opioid pain medicines for longer than 3 days increases your risk of side effects. Side effects include: Constipation. Nausea and vomiting. Breathing difficulties (respiratory depression). Drowsiness. Confusion. Opioid use disorder. Itching. Taking opioid pain medicine for a long period of time can affect your ability to do daily tasks. It also puts you at risk for: Motor vehicle crashes. Depression. Suicide. Heart attack. Overdose, which can be life-threatening. What is a  pain treatment plan? A pain treatment plan is an agreement between you and your health care provider. Pain is unique to each person, and treatments vary depending on your condition. To manage your pain, you and your health care provider need to work together. To help you do this: Discuss the goals of your treatment, including how much pain you might expect to have and how you will manage the pain. Review the risks and benefits of taking opioid medicines. Remember that a good treatment plan uses more than one approach and minimizes the chance of side effects. Be honest about the amount of medicines you take and about any drug or alcohol use. Get pain medicine prescriptions from only one health care provider. Pain can be managed with many types of alternative treatments. Ask your health care provider to refer you to one or more specialists who can help you manage pain through: Physical or occupational therapy. Counseling (cognitive behavioral therapy). Good nutrition. Biofeedback. Massage. Meditation. Non-opioid medicine. Following a gentle exercise program. How to use opioid pain medicine Taking medicine Take your pain medicine exactly as told by your health care provider. Take it only when you need it. If your pain gets less severe, you may take less than your prescribed dose if your health care provider approves. If you are not having pain, do nottake pain medicine unless your health care provider tells you to take it. If  your pain is severe, do nottry to treat it yourself by taking more pills than instructed on your prescription. Contact your health care provider for help. Write down the times when you take your pain medicine. It is easy to become confused while on pain medicine. Writing the time can help you avoid overdose. Take other over-the-counter or prescription medicines only as told by your health care provider. Keeping yourself and others safe  While you are taking opioid pain  medicine: Do not drive, use machinery, or power tools. Do not sign legal documents. Do not drink alcohol. Do not take sleeping pills. Do not supervise children by yourself. Do not do activities that require climbing or being in high places. Do not go to a lake, river, ocean, spa, or swimming pool. Do not share your pain medicine with anyone. Keep pain medicine in a locked cabinet or in a secure area where pets and children cannot reach it. Stopping your use of opioids If you have been taking opioid medicine for more than a few weeks, you may need to slowly decrease (taper) how much you take until you stop completely. Tapering your use of opioids can decrease your risk of symptoms of withdrawal, such as: Pain and cramping in the abdomen. Nausea. Sweating. Sleepiness. Restlessness. Uncontrollable shaking (tremors). Cravings for the medicine. Do not attempt to taper your use of opioids on your own. Talk with your health care provider about how to do this. Your health care provider may prescribe a step-down schedule based on how much medicine you are taking and how long you have been taking it. Getting rid of leftover pills Do not save any leftover pills. Get rid of leftover pills safely by: Taking the medicine to a prescription take-back program. This is usually offered by the county or law enforcement. Bringing them to a pharmacy that has a drug disposal container. Flushing them down the toilet. Check the label or package insert of your medicine to see whether this is safe to do. Throwing them out in the trash. Check the label or package insert of your medicine to see whether this is safe to do. If it is safe to throw it out, remove the medicine from the original container, put it into a sealable bag or container, and mix it with used coffee grounds, food scraps, dirt, or cat litter before putting it in the trash. Follow these instructions at home: Activity Do exercises as told by your  health care provider. Avoid activities that make your pain worse. Return to your normal activities as told by your health care provider. Ask your health care provider what activities are safe for you. General instructions You may need to take these actions to prevent or treat constipation: Drink enough fluid to keep your urine pale yellow. Take over-the-counter or prescription medicines. Eat foods that are high in fiber, such as beans, whole grains, and fresh fruits and vegetables. Limit foods that are high in fat and processed sugars, such as fried or sweet foods. Keep all follow-up visits. This is important. Where to find support If you have been taking opioids for a long time, you may benefit from receiving support for quitting from a local support group or counselor. Ask your health care provider for a referral to these resources in your area. Where to find more information Centers for Disease Control and Prevention (CDC): FootballExhibition.com.brwww.cdc.gov [place] Food and Drug Administration (FDA): PumpkinSearch.com.eewww.fda.gov Get help right away if: You may have taken too much of an opioid (overdosed).  Common symptoms of an overdose: Your breathing is slower or more shallow than normal. You have a very slow heartbeat (pulse). You have slurred speech. You have nausea and vomiting. Your pupils become very small. You have other potential symptoms: You are very confused. You faint or feel like you will faint. You have cold, clammy skin. You have blue lips or fingernails. You have thoughts of harming yourself or harming others. These symptoms may represent a serious problem that is an emergency. Do not wait to see if the symptoms will go away. Get medical help right away. Call your local emergency services (911 in the [place] Do not drive yourself to the hospital.  If you ever feel like you may hurt yourself or others, or have thoughts about taking your own life, get help right away. Go to your nearest emergency department  or: Call your local emergency services (911 in the [place] Call the [facility] (351)591-2623 in the [place] Call a suicide crisis helpline, such as the [facility] at (248) 598-3380 or 988 in the [place]. This is open 24 hours a day in the [place] Text the Crisis Text Line at 617-644-1639 (in the [place].). Summary Opioid medicines can help you manage moderate to severe pain for a short period of time. A pain treatment plan is an agreement between you and your health care provider. Discuss the goals of your treatment, including how much pain you might expect to have and how you will manage the pain. If you think that you or someone else may have taken too much of an opioid, get medical help right away. This information is not intended to replace advice given to you by your health care provider. Make sure you discuss any questions you have with your health care provider. Document Revised: 05/14/2021 Document Reviewed: 01/29/2021 Elsevier Patient Education  [place] directives: In Chart  Conditions/risks identified: None  Next appointment: Follow up in one year for your annual wellness visit    Preventive Care 65 Years and Older, Female Preventive care refers to lifestyle choices and visits with your health care provider that can promote health and wellness. What does preventive care include? A yearly physical exam. This is also called an annual well check. Dental exams once or twice a year. Routine eye exams. Ask your health care provider how often you should have your eyes checked. Personal lifestyle choices, including: Daily care of your teeth and gums. Regular physical activity. Eating a healthy diet. Avoiding tobacco and drug use. Limiting alcohol use. Practicing safe sex. Taking low-dose aspirin every day. Taking vitamin and mineral supplements as recommended by your health care provider. What happens during an annual well  check? The services and screenings done by your health care provider during your annual well check will depend on your age, overall health, lifestyle risk factors, and family history of disease. Counseling  Your health care provider may ask you questions about your: Alcohol use. Tobacco use. Drug use. Emotional well-being. Home and relationship well-being. Sexual activity. Eating habits. History of falls. Memory and ability to understand (cognition). Work and work Statistician. Reproductive health. Screening  You may have the following tests or measurements: Height, weight, and BMI. Blood pressure. Lipid and cholesterol levels. These may be checked every 5 years, or more frequently if you are over 31 years old. Skin check. Lung cancer screening. You may have this screening every year starting at age 33 if you have a 30-pack-year history of  smoking and currently smoke or have quit within the past 15 years. Fecal occult blood test (FOBT) of the stool. You may have this test every year starting at age 85. Flexible sigmoidoscopy or colonoscopy. You may have a sigmoidoscopy every 5 years or a colonoscopy every 10 years starting at age 80. Hepatitis C blood test. Hepatitis B blood test. Sexually transmitted disease (STD) testing. Diabetes screening. This is done by checking your blood sugar (glucose) after you have not eaten for a while (fasting). You may have this done every 1-3 years. Bone density scan. This is done to screen for osteoporosis. You may have this done starting at age 58. Mammogram. This may be done every 1-2 years. Talk to your health care provider about how often you should have regular mammograms. Talk with your health care provider about your test results, treatment options, and if necessary, the need for more tests. Vaccines  Your health care provider may recommend certain vaccines, such as: Influenza vaccine. This is recommended every year. Tetanus, diphtheria, and  acellular pertussis (Tdap, Td) vaccine. You may need a Td booster every 10 years. Zoster vaccine. You may need this after age 48. Pneumococcal 13-valent conjugate (PCV13) vaccine. One dose is recommended after age 9. Pneumococcal polysaccharide (PPSV23) vaccine. One dose is recommended after age 32. Talk to your health care provider about which screenings and vaccines you need and how often you need them. This information is not intended to replace advice given to you by your health care provider. Make sure you discuss any questions you have with your health care provider. Document Released: 11/15/2015 Document Revised: 07/08/2016 Document Reviewed: 08/20/2015 Elsevier Interactive Patient Education  2017 [employer].  Fall Prevention in the Home Falls can cause injuries. They can happen to people of all ages. There are many things you can do to make your home safe and to help prevent falls. What can I do on the outside of my home? Regularly fix the edges of walkways and driveways and fix any cracks. Remove anything that might make you trip as you walk through a door, such as a raised step or threshold. Trim any bushes or trees on the path to your home. Use bright outdoor lighting. Clear any walking paths of anything that might make someone trip, such as rocks or tools. Regularly check to see if handrails are loose or broken. Make sure that both sides of any steps have handrails. Any raised decks and porches should have guardrails on the edges. Have any leaves, snow, or ice cleared regularly. Use sand or salt on walking paths during winter. Clean up any spills in your garage right away. This includes oil or grease spills. What can I do in the bathroom? Use night lights. Install grab bars by the toilet and in the tub and shower. Do not use towel bars as grab bars. Use non-skid mats or decals in the tub or shower. If you need to sit down in the shower, use a plastic, non-slip stool. Keep  the floor dry. Clean up any water that spills on the floor as soon as it happens. Remove soap buildup in the tub or shower regularly. Attach bath mats securely with double-sided non-slip rug tape. Do not have throw rugs and other things on the floor that can make you trip. What can I do in the bedroom? Use night lights. Make sure that you have a light by your bed that is easy to reach. Do not use any sheets or blankets that  are too big for your bed. They should not hang down onto the floor. Have a firm chair that has side arms. You can use this for support while you get dressed. Do not have throw rugs and other things on the floor that can make you trip. What can I do in the kitchen? Clean up any spills right away. Avoid walking on wet floors. Keep items that you use a lot in easy-to-reach places. If you need to reach something above you, use a strong step stool that has a grab bar. Keep electrical cords out of the way. Do not use floor polish or wax that makes floors slippery. If you must use wax, use non-skid floor wax. Do not have throw rugs and other things on the floor that can make you trip. What can I do with my stairs? Do not leave any items on the stairs. Make sure that there are handrails on both sides of the stairs and use them. Fix handrails that are broken or loose. Make sure that handrails are as long as the stairways. Check any carpeting to make sure that it is firmly attached to the stairs. Fix any carpet that is loose or worn. Avoid having throw rugs at the top or bottom of the stairs. If you do have throw rugs, attach them to the floor with carpet tape. Make sure that you have a light switch at the top of the stairs and the bottom of the stairs. If you do not have them, ask someone to add them for you. What else can I do to help prevent falls? Wear shoes that: Do not have high heels. Have rubber bottoms. Are comfortable and fit you well. Are closed at the toe. Do not  wear sandals. If you use a stepladder: Make sure that it is fully opened. Do not climb a closed stepladder. Make sure that both sides of the stepladder are locked into place. Ask someone to hold it for you, if possible. Clearly mark and make sure that you can see: Any grab bars or handrails. First and last steps. Where the edge of each step is. Use tools that help you move around (mobility aids) if they are needed. These include: Canes. Walkers. Scooters. Crutches. Turn on the lights when you go into a dark area. Replace any light bulbs as soon as they burn out. Set up your furniture so you have a clear path. Avoid moving your furniture around. If any of your floors are uneven, fix them. If there are any pets around you, be aware of where they are. Review your medicines with your doctor. Some medicines can make you feel dizzy. This can increase your chance of falling. Ask your doctor what other things that you can do to help prevent falls. This information is not intended to replace advice given to you by your health care provider. Make sure you discuss any questions you have with your health care provider. Document Released: 08/15/2009 Document Revised: 03/26/2016 Document Reviewed: 11/23/2014 Elsevier Interactive Patient Education  2017 [employer].

## 2022-08-12 NOTE — Progress Notes (Signed)
Subjective:   Tammy Barron is a 81 y.o. female who presents for Medicare Annual (Subsequent) preventive examination.  Review of Systems    Virtual Visit via Telephone Note  I connected with  Tammy Barron on 08/12/22 at  1:00 PM EDT by telephone and verified that I am speaking with the correct person using two identifiers.  Location: Patient: Home Provider: Office Persons participating in the virtual visit: patient/Nurse Health Advisor   I discussed the limitations, risks, security and privacy concerns of performing an evaluation and management service by telephone and the availability of in person appointments. The patient expressed understanding and agreed to proceed.  Interactive audio and video telecommunications were attempted between this nurse and patient, however failed, due to patient having technical difficulties OR patient did not have access to video capability.  We continued and completed visit with audio only.  Some vital signs may be absent or patient reported.   Criselda Peaches, LPN  Cardiac Risk Factors include: advanced age (>10men, >17 women);hypertension     Objective:    Today's Vitals   08/12/22 1303  Weight: 184 lb (83.5 kg)  Height: 5\' 2"  (1.575 m)   Body mass index is 33.65 kg/m.     08/12/2022    1:10 PM 07/30/2021    1:22 PM 11/30/2019    4:14 PM 08/01/2019    8:32 AM 10/05/2018    2:34 PM 06/10/2017    4:00 PM 12/10/2016    2:24 PM  Advanced Directives  Does Patient Have a Medical Advance Directive? Yes Yes Yes Yes Yes No Yes  Type of Paramedic of [place];Living will Living will Living will;Healthcare Power of [place];Living will   [place];Living will  Does patient want to make changes to medical advance directive? No - Patient declined  No - Patient declined No - Patient declined     Copy of [place] in Chart? Yes - validated most recent  copy scanned in chart (See row information)  No - copy requested No - copy requested     Would patient like information on creating a medical advance directive?    No - Patient declined  No - Patient declined     Current Medications (verified) Outpatient Encounter Medications as of 08/12/2022  Medication Sig   acetaminophen (TYLENOL) 500 MG tablet Take 500 mg by mouth every 6 (six) hours as needed for mild pain, moderate pain or headache.    atorvastatin (LIPITOR) 10 MG tablet TAKE 1 TABLET BY MOUTH EVERY DAY AT NIGHT   bisoprolol-hydrochlorothiazide (ZIAC) 10-6.25 MG tablet Take 1 tablet by mouth daily.   clorazepate (TRANXENE) 3.75 MG tablet Take 1 tablet (3.75 mg total) by mouth 2 (two) times daily as needed for anxiety.   dabigatran (PRADAXA) 150 MG CAPS capsule Take 1 capsule (150 mg total) by mouth 2 (two) times daily.   Multiple Vitamin (MULTIVITAMIN) tablet Take 1 tablet by mouth daily.   oxybutynin (DITROPAN) 5 MG tablet Take 1 tablet (5 mg total) by mouth every 8 (eight) hours as needed for bladder spasms.   traMADol (ULTRAM) 50 MG tablet Take 1 tablet (50 mg total) by mouth every 8 (eight) hours as needed for moderate pain. for pain   No facility-administered encounter medications on file as of 08/12/2022.    Allergies (verified) Latex   History: Past Medical History:  Diagnosis Date   Anemia    Anxiety  Atrial fibrillation (HCC)    Carotid stenosis, right 01/09/2019   Depression    Hx of abnormal Pap smear    Hyperlipidemia    Hypertension    Peripheral artery disease (HCC) 01/09/2019   Past Surgical History:  Procedure Laterality Date   ABDOMINAL HYSTERECTOMY  1982   ANKLE ARTHODESIS W/ ARTHROSCOPY     Family History  Problem Relation Age of Onset   Heart disease Mother    Heart attack Father    Cancer Sister    Hypertension Maternal Aunt    Social History   Socioeconomic History   Marital status: Widowed    Spouse name: Not on file   Number of children:  1   Years of education: Not on file   Highest education level: Not on file  Occupational History   Not on file  Tobacco Use   Smoking status: Some Days    Packs/day: 0.25    Years: 40.00    Total pack years: 10.00    Types: Cigarettes    Last attempt to quit: 11/10/2018    Years since quitting: 3.7   Smokeless tobacco: Never   Tobacco comments:    3-4 per day  Vaping Use   Vaping Use: Never used  Substance and Sexual Activity   Alcohol use: No    Alcohol/week: 0.0 standard drinks of alcohol   Drug use: Never   Sexual activity: Not on file  Other Topics Concern   Not on file  Social History Narrative   HH 1   2 children out of state   2 grandchildren   Brother, niece, & nephew live locally   Social Determinants of Health   Financial Resource Strain: Low Risk  (08/12/2022)   Overall Financial Resource Strain (CARDIA)    Difficulty of Paying Living Expenses: Not hard at all  Food Insecurity: No Food Insecurity (08/12/2022)   Hunger Vital Sign    Worried About Running Out of Food in the Last Year: Never true    Ran Out of Food in the Last Year: Never true  Transportation Needs: No Transportation Needs (08/12/2022)   PRAPARE - Administrator, Civil ServiceTransportation    Lack of Transportation (Medical): No    Lack of Transportation (Non-Medical): No  Physical Activity: Inactive (08/12/2022)   Exercise Vital Sign    Days of Exercise per Week: 0 days    Minutes of Exercise per Session: 0 min  Stress: No Stress Concern Present (08/12/2022)   [employer] - Occupational Stress Questionnaire    Feeling of Stress : Not at all  Social Connections: Moderately Integrated (08/12/2022)   Social Connection and Isolation Panel [NHANES]    Frequency of Communication with Friends and Family: More than three times a week    Frequency of Social Gatherings with Friends and Family: More than three times a week    Attends Religious Services: More than 4 times per year    Active Member  of [employer] or Organizations: Yes    Attends BankerClub or Organization Meetings: More than 4 times per year    Marital Status: Widowed    Tobacco Counseling Ready to quit: No Counseling given: Yes Tobacco comments: 3-4 per day   Clinical Intake:  Pre-visit preparation completed: No  Pain : No/denies pain     BMI - recorded: 33.65 Nutritional Status: BMI > 30  Obese Nutritional Risks: None Diabetes: No  How often do you need to have someone help you when you read instructions, pamphlets, or  other written materials from your doctor or pharmacy?: 1 - Never  Diabetic?  No  Interpreter Needed?: No  Information entered by :: Theresa Mulligan LPN   Activities of Daily Living    08/12/2022    1:09 PM  In your present state of health, do you have any difficulty performing the following activities:  Hearing? 0  Vision? 0  Difficulty concentrating or making decisions? 0  Walking or climbing stairs? 0  Dressing or bathing? 0  Doing errands, shopping? 0  Preparing Food and eating ? N  Using the Toilet? N  In the past six months, have you accidently leaked urine? Y  Comment Followed by PCP  Do you have problems with loss of bowel control? N  Managing your Medications? N  Managing your Finances? N  Housekeeping or managing your Housekeeping? N    Patient Care Team: Karie Georges, MD as PCP - General (Family Medicine) Yates Decamp, MD as Consulting Physician (Cardiology)  Indicate any recent Medical Services you may have received from other than [facility] providers in the past year (date may be approximate).     Assessment:   This is a routine wellness examination for [facility].  Hearing/Vision screen Hearing Screening - Comments:: Denies hearing difficulties   Vision Screening - Comments:: Wears rx glasses - up to date with routine eye exams with  [facility]  Dietary issues and exercise activities discussed: Current Exercise Habits: The patient does not participate in  regular exercise at present, Exercise limited by: None identified   Goals Addressed               This Visit's Progress     Patient Stated (pt-stated)        Stay healthy.        Depression Screen    08/12/2022    1:08 PM 10/31/2021    2:36 PM 07/30/2021    1:21 PM 11/30/2019    4:18 PM 10/05/2018    2:37 PM 01/05/2017   10:08 AM 10/18/2015    4:17 PM  PHQ 2/9 Scores  PHQ - 2 Score 0 0 0 0 0 0 0  PHQ- 9 Score  4         Fall Risk    08/12/2022    1:10 PM 07/30/2021    1:23 PM 11/30/2019    4:17 PM 10/05/2018    2:37 PM 01/05/2017   10:08 AM  Fall Risk   Falls in the past year? 0 0 1 0 No  Number falls in past yr: 0 0 0    Injury with Fall? 0 0 1    Risk for fall due to : No Fall Risks Impaired vision History of fall(s);Medication side effect    Follow up Falls prevention discussed Falls prevention discussed Falls evaluation completed;Education provided;Falls prevention discussed      FALL RISK PREVENTION PERTAINING TO THE HOME:  Any stairs in or around the home? Yes  If so, are there any without handrails? No  Home free of loose throw rugs in walkways, pet beds, electrical cords, etc? Yes  Adequate lighting in your home to reduce risk of falls? Yes   ASSISTIVE DEVICES UTILIZED TO PREVENT FALLS:  Life alert? Yes  Use of a cane, walker or w/c? Yes  Grab bars in the bathroom? Yes  Shower chair or bench in shower? Yes  Elevated toilet seat or a handicapped toilet? No   TIMED UP AND GO:  Was the test performed?  No . Audio Visit   Cognitive Function:        08/12/2022    1:10 PM 07/30/2021    1:36 PM 11/30/2019    4:19 PM  6CIT Screen  What Year? 0 points 0 points 0 points  What month? 0 points 0 points 0 points  What time? 0 points 0 points 0 points  Count back from 20 0 points 0 points 0 points  Months in reverse 0 points 4 points 0 points  Repeat phrase 0 points 4 points 0 points  Total Score 0 points 8 points 0 points     Immunizations Immunization History  Administered Date(s) Administered   Influenza Whole 08/22/2009   Influenza, High Dose Seasonal PF 10/18/2015, 09/18/2017, 10/05/2018   Influenza,inj,Quad PF,6+ Mos 08/23/2014   Influenza-Unspecified 09/02/2013, 10/05/2016, 08/03/2019, 07/03/2021   PFIZER(Purple Top)SARS-COV-2 Vaccination 12/07/2019, 12/28/2019, 08/17/2020, 10/24/2021   Pneumococcal Conjugate-13 08/23/2014   Pneumococcal Polysaccharide-23 08/06/2008, 10/08/2010   Tdap 07/10/2021   Zoster Recombinat (Shingrix) 03/02/2022, 05/02/2022    TDAP status: Up to date  Flu Vaccine status: Up to date  Pneumococcal vaccine status: Up to date  Covid-19 vaccine status: Completed vaccines  Qualifies for Shingles Vaccine? Yes   Zostavax completed Yes   Shingrix Completed?: Yes  Screening Tests Health Maintenance  Topic Date Due   COVID-19 Vaccine (5 - Pfizer risk series) 08/28/2022 (Originally 12/19/2021)   INFLUENZA VACCINE  02/01/2023 (Originally 06/02/2022)   TETANUS/TDAP  07/11/2031   Pneumonia Vaccine 44+ Years old  Completed   Zoster Vaccines- Shingrix  Completed   HPV VACCINES  Aged Out    Health Maintenance  There are no preventive care reminders to display for this patient.   Colorectal cancer screening: No longer required.   Mammogram status: No longer required due to Age.    Barron Cancer Screening: (Low Dose CT Chest recommended if Age 75-80 years, 30 pack-year currently smoking OR have quit w/in 15years.) does qualify.   Barron Cancer Screening Referral: Patient deferred  Additional Screening:  Hepatitis C Screening: does not qualify; Completed   Vision Screening: Recommended annual ophthalmology exams for early detection of glaucoma and other disorders of the eye. Is the patient up to date with their annual eye exam?  Yes  Who is the provider or what is the name of the office in which the patient attends annual eye exams? [facility] If pt is not  established with a provider, would they like to be referred to a provider to establish care? No .   Dental Screening: Recommended annual dental exams for proper oral hygiene  Community Resource Referral / Chronic Care Management:  CRR required this visit?  No   CCM required this visit?  No      Plan:     I have personally reviewed and noted the following in the patient's chart:   Medical and social history Use of alcohol, tobacco or illicit drugs  Current medications and supplements including opioid prescriptions. Patient is currently taking opioid prescriptions. Information provided to patient regarding non-opioid alternatives. Patient advised to discuss non-opioid treatment plan with their provider. Functional ability and status Nutritional status Physical activity Advanced directives List of other physicians Hospitalizations, surgeries, and ER visits in previous 12 months Vitals Screenings to include cognitive, depression, and falls Referrals and appointments  In addition, I have reviewed and discussed with patient certain preventive protocols, quality metrics, and best practice recommendations. A written personalized care plan for preventive services as well as general preventive  health recommendations were provided to patient.     Tillie Rung, LPN   09/[id number]   Nurse Notes: None

## 2022-08-19 ENCOUNTER — Telehealth: Payer: Self-pay | Admitting: Family Medicine

## 2022-08-19 NOTE — Telephone Encounter (Signed)
Ok to send verbal order

## 2022-08-19 NOTE — Telephone Encounter (Signed)
Tammy Barron from [facility] call and stated she need verbal order for PT 1 x a wk for 1 wk ,2 x a  wk for 2 wk's and 1 x a wk for 2 wk's. Tammy Barron stated you can leave her a message if she don't pick up her # is [phone].

## 2022-08-19 NOTE — Telephone Encounter (Signed)
Spoke with Belenda Cruise and informed her of the approval for orders as below.

## 2022-08-20 ENCOUNTER — Other Ambulatory Visit: Payer: Self-pay | Admitting: Family Medicine

## 2022-08-20 DIAGNOSIS — Z8673 Personal history of transient ischemic attack (TIA), and cerebral infarction without residual deficits: Secondary | ICD-10-CM

## 2022-08-20 DIAGNOSIS — I480 Paroxysmal atrial fibrillation: Secondary | ICD-10-CM

## 2022-08-20 NOTE — Telephone Encounter (Signed)
Ok to refill with 90 day supply and 1 refill

## 2022-10-13 ENCOUNTER — Other Ambulatory Visit: Payer: Self-pay | Admitting: Family Medicine

## 2022-10-13 ENCOUNTER — Other Ambulatory Visit: Payer: Self-pay | Admitting: Family

## 2022-10-13 DIAGNOSIS — I10 Essential (primary) hypertension: Secondary | ICD-10-CM

## 2022-10-23 NOTE — Telephone Encounter (Signed)
Pt called to FU on this refill and only has 2 left.  Please advise.  [id number] [place], [place] - [address] Phone: [phone]  Fax: [phone]

## 2022-11-16 ENCOUNTER — Other Ambulatory Visit: Payer: Self-pay | Admitting: Family Medicine

## 2022-11-16 DIAGNOSIS — I480 Paroxysmal atrial fibrillation: Secondary | ICD-10-CM

## 2022-11-16 DIAGNOSIS — Z8673 Personal history of transient ischemic attack (TIA), and cerebral infarction without residual deficits: Secondary | ICD-10-CM

## 2023-01-02 ENCOUNTER — Other Ambulatory Visit: Payer: Self-pay | Admitting: Family Medicine

## 2023-01-02 DIAGNOSIS — E785 Hyperlipidemia, unspecified: Secondary | ICD-10-CM

## 2023-01-02 DIAGNOSIS — I10 Essential (primary) hypertension: Secondary | ICD-10-CM

## 2023-01-18 ENCOUNTER — Telehealth: Payer: Self-pay | Admitting: Family Medicine

## 2023-01-18 NOTE — Telephone Encounter (Signed)
Prescription Request  01/18/2023  LOV: 07/10/2022  What is the name of the medication or equipment? bisoprolol-hydrochlorothiazide (ZIAC) 10-6.25 MG tablet   Have you contacted your pharmacy to request a refill? Yes   Which pharmacy would you like this sent to?  [id number] [address] [place] [address] Phone: [phone] Fax: [phone]    Patient notified that their request is being sent to the clinical staff for review and that they should receive a response within 2 business days. Pt stated pharmacy sent her the wrong mg she stated pharmacy said you call in 7.5 but she need 3.75 and want a call back because she can't use the 7.5 clorazepate (TRANXENE) 3.75 MG tablet   Please advise at Mobile [phone] (mobile)

## 2023-01-21 ENCOUNTER — Other Ambulatory Visit: Payer: Self-pay | Admitting: Family Medicine

## 2023-01-22 NOTE — Telephone Encounter (Signed)
I sent an rx in on 3/4, it was supposed to be a month supply at 1/2 tablet twice a day as neeed. it's a little too early for her to refill- she shouldn't need it until April 3

## 2023-03-18 ENCOUNTER — Other Ambulatory Visit: Payer: Self-pay | Admitting: Family Medicine

## 2023-03-18 DIAGNOSIS — I480 Paroxysmal atrial fibrillation: Secondary | ICD-10-CM

## 2023-03-18 DIAGNOSIS — Z8673 Personal history of transient ischemic attack (TIA), and cerebral infarction without residual deficits: Secondary | ICD-10-CM

## 2023-03-18 DIAGNOSIS — E785 Hyperlipidemia, unspecified: Secondary | ICD-10-CM

## 2023-04-18 ENCOUNTER — Other Ambulatory Visit: Payer: Self-pay | Admitting: Family Medicine

## 2023-04-18 DIAGNOSIS — I10 Essential (primary) hypertension: Secondary | ICD-10-CM

## 2023-04-30 ENCOUNTER — Other Ambulatory Visit: Payer: Self-pay | Admitting: Family Medicine

## 2023-04-30 DIAGNOSIS — I10 Essential (primary) hypertension: Secondary | ICD-10-CM

## 2023-06-10 ENCOUNTER — Other Ambulatory Visit: Payer: Self-pay | Admitting: Family Medicine

## 2023-06-10 DIAGNOSIS — E785 Hyperlipidemia, unspecified: Secondary | ICD-10-CM

## 2023-06-10 DIAGNOSIS — I480 Paroxysmal atrial fibrillation: Secondary | ICD-10-CM

## 2023-06-10 DIAGNOSIS — Z8673 Personal history of transient ischemic attack (TIA), and cerebral infarction without residual deficits: Secondary | ICD-10-CM

## 2023-06-10 DIAGNOSIS — I10 Essential (primary) hypertension: Secondary | ICD-10-CM

## 2023-06-24 DIAGNOSIS — D485 Neoplasm of uncertain behavior of skin: Secondary | ICD-10-CM | POA: Diagnosis not present

## 2023-06-24 DIAGNOSIS — L820 Inflamed seborrheic keratosis: Secondary | ICD-10-CM | POA: Diagnosis not present

## 2023-06-24 DIAGNOSIS — L821 Other seborrheic keratosis: Secondary | ICD-10-CM | POA: Diagnosis not present

## 2023-06-24 DIAGNOSIS — L538 Other specified erythematous conditions: Secondary | ICD-10-CM | POA: Diagnosis not present

## 2023-06-24 DIAGNOSIS — C4401 Basal cell carcinoma of skin of lip: Secondary | ICD-10-CM | POA: Diagnosis not present

## 2023-06-24 DIAGNOSIS — L570 Actinic keratosis: Secondary | ICD-10-CM | POA: Diagnosis not present

## 2023-06-25 ENCOUNTER — Telehealth: Payer: Self-pay | Admitting: Family Medicine

## 2023-06-25 DIAGNOSIS — Z9181 History of falling: Secondary | ICD-10-CM

## 2023-06-25 DIAGNOSIS — Z8673 Personal history of transient ischemic attack (TIA), and cerebral infarction without residual deficits: Secondary | ICD-10-CM

## 2023-06-25 NOTE — Telephone Encounter (Signed)
Pt son call and stated she want to know if she can get a [place] walker also want a call back.

## 2023-06-29 NOTE — Telephone Encounter (Signed)
Ok to order a rollator walker for the patient.

## 2023-06-30 NOTE — Telephone Encounter (Signed)
Order printed and placed in the red folder.

## 2023-07-21 ENCOUNTER — Other Ambulatory Visit: Payer: Self-pay | Admitting: Family Medicine

## 2023-07-22 NOTE — Telephone Encounter (Signed)
Left a detailed message with the information below at the patient's home number.

## 2023-07-22 NOTE — Telephone Encounter (Signed)
Patient has not seen me in over 1 year, she will need an appointment to continue her medication.

## 2023-07-26 ENCOUNTER — Telehealth: Payer: Self-pay | Admitting: Family Medicine

## 2023-07-26 DIAGNOSIS — F419 Anxiety disorder, unspecified: Secondary | ICD-10-CM

## 2023-07-26 DIAGNOSIS — C4401 Basal cell carcinoma of skin of lip: Secondary | ICD-10-CM | POA: Diagnosis not present

## 2023-07-26 MED ORDER — CLORAZEPATE DIPOTASSIUM 7.5 MG PO TABS
7.5000 mg | ORAL_TABLET | Freq: Two times a day (BID) | ORAL | 1 refills | Status: DC | PRN
Start: 2023-07-26 — End: 2023-09-06

## 2023-07-26 NOTE — Addendum Note (Signed)
Addended by: Karie Georges on: 07/26/2023 03:23 PM   Modules accepted: Orders

## 2023-07-26 NOTE — Telephone Encounter (Signed)
Pt having a surgery on her face today and feels she needs to reschedule her 07/27/23 appt. Says she will run out of clorazepate (TRANXENE) 7.5 MG tablet prior to her rescheduled appt date requesting a refill

## 2023-07-26 NOTE — Telephone Encounter (Signed)
Script sent until her appt on 11/3

## 2023-07-27 ENCOUNTER — Ambulatory Visit: Payer: Medicare PPO | Admitting: Family Medicine

## 2023-08-17 ENCOUNTER — Ambulatory Visit (INDEPENDENT_AMBULATORY_CARE_PROVIDER_SITE_OTHER): Payer: Medicare PPO | Admitting: Family Medicine

## 2023-08-17 VITALS — Ht 63.0 in | Wt 175.0 lb

## 2023-08-17 DIAGNOSIS — Z0000 Encounter for general adult medical examination without abnormal findings: Secondary | ICD-10-CM | POA: Diagnosis not present

## 2023-08-17 NOTE — Progress Notes (Signed)
PATIENT CHECK-IN and HEALTH RISK ASSESSMENT QUESTIONNAIRE:  -completed by phone/video for upcoming Medicare Preventive Visit  Pre-Visit Check-in: 1)Vitals (height, wt, BP, etc) - record in vitals section for visit on day of visit Request home vitals (wt, BP, etc.) and enter into vitals, THEN update Vital Signs SmartPhrase below at the top of the HPI. See below.  2)Review and Update Medications, Allergies PMH, Surgeries, Social history in Epic 3)Hospitalizations in the last year with date/reason? No  4)Review and Update Care Team (patient's specialists) in Epic 5) Complete PHQ9 in Epic  6) Complete Fall Screening in Epic 7)Review all Health Maintenance Due and order under PCP if not done.  8)Medicare Wellness Questionnaire: Answer theses question about your habits: Do you drink alcohol? No If yes, how many drinks do you have a day?n/a Have you ever smoked?yes  Quit date if applicable? Still smokes some days  How many packs a day do/did you smoke? A pack last for several days or weeks.  Do you use smokeless tobacco?no Do you use an illicit drugs?no Do you exercises? Every day IF so, what type and how many days/minutes per week?walking, lifting and squatting. Unsure how many minutes she spend on it. Had PT in the past and she has a list of balance exercises to do.  Are you sexually active? No Number of partners? Typical breakfast:cereal  Typical lunch: usually have brunch with cereal  Typical dinner: salad, meat, vegetable, fruits Typical snacks: groofes -chip like crackers  Beverages: Tea mainly- low sugar, water  Answer theses question about you: Can you perform most household chores?No due to can't walk very well. Using cane, and walker.  Do you find it hard to follow a conversation in a noisy room?no Do you often ask people to speak up or repeat themselves?no Do you feel that you have a problem with memory?yes Do you balance your checkbook and or bank acounts?no Do you feel safe  at home?yes Last dentist visit? It was 6 months ago.  Do you need assistance with any of the following: NO  Driving?  Feeding yourself?  Getting from bed to chair?  Getting to the toilet?  Bathing or showering?  Dressing yourself?  Managing money?  Climbing a flight of stairs  Preparing meals?   Do you have Advanced Directives in place (Living Will, Healthcare Power or Attorney)? Yes   Last eye Exam and location?at [facility] care, a year ago.    Do you currently use prescribed or non-prescribed narcotic or opioid pain medications?No  Do you have a history or close family history of breast, ovarian, tubal or peritoneal cancer or a family member with BRCA (breast cancer susceptibility 1 and 2) gene mutations?No   Request home vitals (wt, BP, etc.) and enter into vitals, THEN update Vital Signs SmartPhrase below at the top of the HPI. See below.   Nurse/Assistant Credentials/time stamp: Karpuih Moyun/ CMA/12:20am    ----------------------------------------------------------------------------------------------------------------------------------------------------------------------------------------------------------------------  Because this visit was a virtual/telehealth visit, some criteria may be missing or patient reported. Any vitals not documented were not able to be obtained and vitals that have been documented are patient reported.    MEDICARE ANNUAL PREVENTIVE VISIT WITH PROVIDER: (Welcome to Medicare, initial annual wellness or annual wellness exam)  Virtual Visit via Phone Note  I connected with [name] on 08/17/23 by phone and verified that I am speaking with the correct person using two identifiers.  Location patient: home Location provider:work or home office Persons participating in the virtual visit: patient,  provider  Concerns and/or follow up today: doing well.    See HM section in Epic for other details of completed HM.    ROS: negative for  report of fevers, unintentional weight loss, vision changes, vision loss, hearing loss or change, chest pain, sob, hemoptysis, melena, hematochezia, hematuria, falls, bleeding or bruising, thoughts of suicide or self harm, memory loss  Patient-completed extensive health risk assessment - reviewed and discussed with the patient: See Health Risk Assessment completed with patient prior to the visit either above or in recent phone note. This was reviewed in detailed with the patient today and appropriate recommendations, orders and referrals were placed as needed per Summary below and patient instructions.   Review of Medical History: -PMH, PSH, Family History and current specialty and care providers reviewed and updated and listed below   Patient Care Team: Karie Georges, MD as PCP - General (Family Medicine) Yates Decamp, MD as Consulting Physician (Cardiology) Tora Duck, PA-C (Physician Assistant)   Past Medical History:  Diagnosis Date   Anemia    Anxiety    Atrial fibrillation [facility])    Carotid stenosis, right 01/09/2019   Depression    Hx of abnormal Pap smear    Hyperlipidemia    Hypertension    Peripheral artery disease (HCC) 01/09/2019    Past Surgical History:  Procedure Laterality Date   ABDOMINAL HYSTERECTOMY  1982   ANKLE ARTHODESIS W/ ARTHROSCOPY      Social History   Socioeconomic History   Marital status: Widowed    Spouse name: Not on file   Number of children: 1   Years of education: Not on file   Highest education level: Not on file  Occupational History   Not on file  Tobacco Use   Smoking status: Some Days    Current packs/day: 0.00    Average packs/day: 0.3 packs/day for 40.0 years (10.0 ttl pk-yrs)    Types: Cigarettes    Start date: 11/10/1978    Last attempt to quit: 11/10/2018    Years since quitting: 4.7   Smokeless tobacco: Never   Tobacco comments:    3-4 per day  Vaping Use   Vaping status: Never Used  Substance and Sexual Activity    Alcohol use: No    Alcohol/week: 0.0 standard drinks of alcohol   Drug use: Never   Sexual activity: Not on file  Other Topics Concern   Not on file  Social History Narrative   HH 1   2 children out of state   2 grandchildren   Brother, niece, & nephew live locally   Social Determinants of Health   Financial Resource Strain: Low Risk  (08/12/2022)   Overall Financial Resource Strain (CARDIA)    Difficulty of Paying Living Expenses: Not hard at all  Food Insecurity: No Food Insecurity (08/12/2022)   Hunger Vital Sign    Worried About Running Out of Food in the Last Year: Never true    Ran Out of Food in the Last Year: Never true  Transportation Needs: No Transportation Needs (08/12/2022)   PRAPARE - Administrator, Civil Service (Medical): No    Lack of Transportation (Non-Medical): No  Physical Activity: Inactive (08/12/2022)   Exercise Vital Sign    Days of Exercise per Week: 0 days    Minutes of Exercise per Session: 0 min  Stress: No Stress Concern Present (08/12/2022)   [employer] of Occupational Health - Occupational Stress Questionnaire    Feeling  of Stress : Not at all  Social Connections: Moderately Integrated (08/12/2022)   Social Connection and Isolation Panel [NHANES]    Frequency of Communication with Friends and Family: More than three times a week    Frequency of Social Gatherings with Friends and Family: More than three times a week    Attends Religious Services: More than 4 times per year    Active Member of [employer] or Organizations: Yes    Attends Banker Meetings: More than 4 times per year    Marital Status: Widowed  Intimate Partner Violence: Not At Risk (08/12/2022)   Humiliation, Afraid, Rape, and Kick questionnaire    Fear of Current or Ex-Partner: No    Emotionally Abused: No    Physically Abused: No    Sexually Abused: No    Family History  Problem Relation Age of Onset   Heart disease Mother    Heart attack  Father    Cancer Sister    Hypertension Maternal Aunt     Current Outpatient Medications on File Prior to Visit  Medication Sig Dispense Refill   acetaminophen (TYLENOL) 500 MG tablet Take 500 mg by mouth every 6 (six) hours as needed for mild pain, moderate pain or headache.      atorvastatin (LIPITOR) 10 MG tablet TAKE 1 TABLET BY MOUTH EVERY DAY AT NIGHT 90 tablet 0   bisoprolol-hydrochlorothiazide (ZIAC) 10-6.25 MG tablet TAKE 1 TABLET BY MOUTH EVERY DAY 90 tablet 0   clorazepate (TRANXENE) 7.5 MG tablet Take 1 tablet (7.5 mg total) by mouth 2 (two) times daily as needed for anxiety. 30 tablet 1   dabigatran (PRADAXA) 150 MG CAPS capsule TAKE 1 CAPSULE BY MOUTH TWICE A DAY 180 capsule 0   Multiple Vitamin (MULTIVITAMIN) tablet Take 1 tablet by mouth daily.     oxybutynin (DITROPAN) 5 MG tablet Take 1 tablet (5 mg total) by mouth every 8 (eight) hours as needed for bladder spasms. (Patient not taking: Reported on 08/17/2023) 30 tablet 2   traMADol (ULTRAM) 50 MG tablet Take 1 tablet (50 mg total) by mouth every 8 (eight) hours as needed for moderate pain. for pain (Patient not taking: Reported on 08/17/2023) 40 tablet 0   No current facility-administered medications on file prior to visit.    Allergies  Allergen Reactions   Latex Rash       Physical Exam Vitals requested from patient and listed below if patient had equipment and was able to obtain at home for this virtual visit: Vitals:   Estimated body mass index is 31 kg/m as calculated from the following:   Height as of this encounter: 5\' 3"  (1.6 m).   Weight as of this encounter: 175 lb (79.4 kg).  EKG (optional): deferred due to virtual visit  GENERAL: alert, oriented, no acute distress detected, full vision exam deferred due to pandemic and/or virtual encounter  PSYCH/NEURO: pleasant and cooperative, no obvious depression or anxiety, speech and thought processing grossly intact, Cognitive function grossly  intact  Flowsheet Row Office Visit from 08/17/2023 in [facility]  PHQ-9 Total Score 2           08/17/2023   12:01 PM 08/12/2022    1:08 PM 10/31/2021    2:36 PM 07/30/2021    1:21 PM 11/30/2019    4:18 PM  Depression screen PHQ 2/9  Decreased Interest 1 0 0 0 0  Down, Depressed, Hopeless 1 0 0 0 0  PHQ - 2  Score 2 0 0 0 0  Altered sleeping 0  1    Tired, decreased energy 0  1    Change in appetite 0  1    Feeling bad or failure about yourself  0  0    Trouble concentrating 0  0    Moving slowly or fidgety/restless 0  1    Suicidal thoughts 0  0    PHQ-9 Score 2  4    Difficult doing work/chores Somewhat difficult           08/04/2019    9:41 AM 11/30/2019    4:17 PM 07/30/2021    1:23 PM 08/12/2022    1:10 PM 08/17/2023   12:01 PM  Fall Risk  Falls in the past year?  1 0 0 0  Was there an injury with Fall?  1 0 0 0  Fall Risk Category Calculator  2 0 0 0  Fall Risk Category (Retired)  Moderate Low Low   (RETIRED) Patient Fall Risk Level High fall risk Moderate fall risk  Low fall risk   Patient at Risk for Falls Due to  History of fall(s);Medication side effect Impaired vision No Fall Risks No Fall Risks  Fall risk Follow up  Falls evaluation completed;Education provided;Falls prevention discussed Falls prevention discussed Falls prevention discussed Falls evaluation completed     SUMMARY AND PLAN:  Encounter for Medicare annual wellness exam   Discussed applicable health maintenance/preventive health measures and advised and referred or ordered per patient preferences: -plans to get flu shot at office - sent message to schedulers -plans to get covid shot at pharmacy, asked her to update [place] when she does -declines dexa Health Maintenance  Topic Date Due   INFLUENZA VACCINE  06/03/2023   COVID-19 Vaccine (5 - 2023-24 season) 07/04/2023   Medicare Annual Wellness (AWV)  08/16/2024   DTaP/Tdap/Td (2 - Td or Tdap) 07/11/2031    Pneumonia Vaccine 64+ Years old  Completed   Zoster Vaccines- Shingrix  Completed   HPV VACCINES  Aged [employer] and counseling on the following was provided based on the above review of health and a plan/checklist for the patient, along with additional information discussed, was provided for the patient in the patient instructions :   -Provided counseling and safe balance exercises that can be done at home to improve balance and discussed exercise guidelines for adults with include balance exercises at least 3 days per week.  Uses cane/walker.  -Advised and counseled on a healthy lifestyle - including the importance of a healthy diet, regular physical activity -Reviewed patient's current diet. Advised and counseled on a whole foods based healthy diet. A summary of a healthy diet was provided in the Patient Instructions.  -reviewed patient's current physical activity level and discussed exercise guidelines for adults. Discussed community resources and ideas for safe exercise at home to assist in meeting exercise guideline recommendations in a safe and healthy way.  -Advise yearly dental visits at minimum and regular eye exams  Follow up: see patient instructions     Patient Instructions  I really enjoyed getting to talk with you today! I am available on Tuesdays and Thursdays for virtual visits if you have any questions or concerns, or if I can be of any further assistance.   CHECKLIST FROM ANNUAL WELLNESS VISIT:  -Follow up (please call to schedule if not scheduled after visit):   -schedule your in office visit an get your flu shot in the next  few weeks   -yearly for annual wellness visit with primary care office  Here is a list of your preventive care/health maintenance measures and the plan for each if any are due:  PLAN For any measures below that may be due:  -get flu shot at office visit with Dr. Russella Dar -get covid shot at the pharmacy  Health Maintenance  Topic Date  Due   INFLUENZA VACCINE  06/03/2023   COVID-19 Vaccine (5 - 2023-24 season) 07/04/2023   Medicare Annual Wellness (AWV)  08/16/2024   DTaP/Tdap/Td (2 - Td or Tdap) 07/11/2031   Pneumonia Vaccine 84+ Years old  Completed   Zoster Vaccines- Shingrix  Completed   HPV VACCINES  Aged Out    -See a dentist at least yearly  -Get your eyes checked and then per your eye specialist's recommendations  -Other issues addressed today:   -I have included below further information regarding a healthy whole foods based diet, physical activity guidelines for adults, stress management and opportunities for social connections. I hope you find this information useful.   -----------------------------------------------------------------------------------------------------------------------------------------------------------------------------------------------------------------------------------------------------------  NUTRITION: -eat real food: lots of colorful vegetables (half the plate) and fruits -5-7 servings of vegetables and fruits per day (fresh or steamed is best), exp. 2 servings of vegetables with lunch and dinner and 2 servings of fruit per day. Berries and greens such as kale and collards are great choices.  -consume on a regular basis: whole grains (make sure first ingredient on label contains the word "whole"), fresh fruits, fish, nuts, seeds, healthy oils (such as olive oil, avocado oil, grape seed oil) -may eat small amounts of dairy and lean meat on occasion, but avoid processed meats such as ham, bacon, lunch meat, etc. -drink water -try to avoid fast food and pre-packaged foods, processed meat -most experts advise limiting sodium to < 2300mg  per day, should limit further is any chronic conditions such as high blood pressure, heart disease, diabetes, etc. The American Heart Association advised that < 1500mg  is is ideal -try to avoid foods that contain any ingredients with names you do not  recognize  -try to avoid sugar/sweets (except for the natural sugar that occurs in fresh fruit) -try to avoid sweet drinks -try to avoid white rice, white bread, pasta (unless whole grain), white or yellow potatoes  EXERCISE GUIDELINES FOR ADULTS: -if you wish to increase your physical activity, do so gradually and with the approval of your doctor -STOP and seek medical care immediately if you have any chest pain, chest discomfort or trouble breathing when starting or increasing exercise  -move and stretch your body, legs, feet and arms when sitting for long periods -Physical activity guidelines for optimal health in adults: -least 150 minutes per week of aerobic exercise (can talk, but not sing) once approved by your doctor, 20-30 minutes of sustained activity or two 10 minute episodes of sustained activity every day.  -resistance training at least 2 days per week if approved by your doctor -balance exercises 3+ days per week:   Stand somewhere where you have something sturdy to hold onto if you lose balance.    1) lift up on toes, start with 5x per day and work up to 20x   2) stand and lift on leg straight out to the side so that foot is a few inches of the floor, start with 5x each side and work up to 20x each side   3) stand on one foot, start with 5 seconds each side and work up  to 20 seconds on each side  If you need ideas or help with getting more active:  -Silver sneakers https://tools.silversneakers.com  -Walk with a Doc: http://www.duncan-williams.com/  -try to include resistance (weight lifting/strength building) and balance exercises twice per week: or the following link for ideas: http://castillo-powell.com/  BuyDucts.dk  STRESS MANAGEMENT: -can try meditating, or just sitting quietly with deep breathing while intentionally relaxing all parts of your body for 5 minutes daily -if you need  further help with stress, anxiety or depression please follow up with your primary doctor or contact the wonderful folks at [facility]: [phone]  SOCIAL CONNECTIONS: -options in [place] if you wish to engage in more social and exercise related activities:  -Silver sneakers https://tools.silversneakers.com  -Walk with a Doc: http://www.duncan-williams.com/  -Check out the [facility] Active Adults 50+ section on the [place] of [employer] (hiking clubs, book clubs, cards and games, chess, exercise classes, aquatic classes and much more) - see the website for details: https://www.[place].gov/departments/parks-recreation/active-adults50  -YouTube has lots of exercise videos for different ages and abilities as well  -[facility] (a variety of indoor and outdoor inperson activities for adults). ([phone]) [phone]. [address]  -Virtual Online Classes (a variety of topics): see seniorplanet.org or call [phone]  -consider volunteering at a school, hospice center, church, senior center or elsewhere           Terressa Koyanagi, DO

## 2023-08-17 NOTE — Patient Instructions (Signed)
I really enjoyed getting to talk with you today! I am available on Tuesdays and Thursdays for virtual visits if you have any questions or concerns, or if I can be of any further assistance.   CHECKLIST FROM ANNUAL WELLNESS VISIT:  -Follow up (please call to schedule if not scheduled after visit):   -schedule your in office visit an get your flu shot in the next few weeks   -yearly for annual wellness visit with primary care office  Here is a list of your preventive care/health maintenance measures and the plan for each if any are due:  PLAN For any measures below that may be due:  -get flu shot at office visit with Dr. Russella Dar -get covid shot at the pharmacy  Health Maintenance  Topic Date Due   INFLUENZA VACCINE  06/03/2023   COVID-19 Vaccine (5 - 2023-24 season) 07/04/2023   Medicare Annual Wellness (AWV)  08/16/2024   DTaP/Tdap/Td (2 - Td or Tdap) 07/11/2031   Pneumonia Vaccine 45+ Years old  Completed   Zoster Vaccines- Shingrix  Completed   HPV VACCINES  Aged Out    -See a dentist at least yearly  -Get your eyes checked and then per your eye specialist's recommendations  -Other issues addressed today:   -I have included below further information regarding a healthy whole foods based diet, physical activity guidelines for adults, stress management and opportunities for social connections. I hope you find this information useful.   -----------------------------------------------------------------------------------------------------------------------------------------------------------------------------------------------------------------------------------------------------------  NUTRITION: -eat real food: lots of colorful vegetables (half the plate) and fruits -5-7 servings of vegetables and fruits per day (fresh or steamed is best), exp. 2 servings of vegetables with lunch and dinner and 2 servings of fruit per day. Berries and greens such as kale and collards are great  choices.  -consume on a regular basis: whole grains (make sure first ingredient on label contains the word "whole"), fresh fruits, fish, nuts, seeds, healthy oils (such as olive oil, avocado oil, grape seed oil) -may eat small amounts of dairy and lean meat on occasion, but avoid processed meats such as ham, bacon, lunch meat, etc. -drink water -try to avoid fast food and pre-packaged foods, processed meat -most experts advise limiting sodium to < 2300mg  per day, should limit further is any chronic conditions such as high blood pressure, heart disease, diabetes, etc. The American Heart Association advised that < 1500mg  is is ideal -try to avoid foods that contain any ingredients with names you do not recognize  -try to avoid sugar/sweets (except for the natural sugar that occurs in fresh fruit) -try to avoid sweet drinks -try to avoid white rice, white bread, pasta (unless whole grain), white or yellow potatoes  EXERCISE GUIDELINES FOR ADULTS: -if you wish to increase your physical activity, do so gradually and with the approval of your doctor -STOP and seek medical care immediately if you have any chest pain, chest discomfort or trouble breathing when starting or increasing exercise  -move and stretch your body, legs, feet and arms when sitting for long periods -Physical activity guidelines for optimal health in adults: -least 150 minutes per week of aerobic exercise (can talk, but not sing) once approved by your doctor, 20-30 minutes of sustained activity or two 10 minute episodes of sustained activity every day.  -resistance training at least 2 days per week if approved by your doctor -balance exercises 3+ days per week:   Stand somewhere where you have something sturdy to hold onto if you lose balance.  1) lift up on toes, start with 5x per day and work up to 20x   2) stand and lift on leg straight out to the side so that foot is a few inches of the floor, start with 5x each side and work  up to 20x each side   3) stand on one foot, start with 5 seconds each side and work up to 20 seconds on each side  If you need ideas or help with getting more active:  -Silver sneakers https://tools.silversneakers.com  -Walk with a Doc: http://www.duncan-williams.com/  -try to include resistance (weight lifting/strength building) and balance exercises twice per week: or the following link for ideas: http://castillo-powell.com/  BuyDucts.dk  STRESS MANAGEMENT: -can try meditating, or just sitting quietly with deep breathing while intentionally relaxing all parts of your body for 5 minutes daily -if you need further help with stress, anxiety or depression please follow up with your primary doctor or contact the wonderful folks at [facility]: [phone]  SOCIAL CONNECTIONS: -options in [place] if you wish to engage in more social and exercise related activities:  -Silver sneakers https://tools.silversneakers.com  -Walk with a Doc: http://www.duncan-williams.com/  -Check out the [facility] Active Adults 50+ section on the [place] of [employer] (hiking clubs, book clubs, cards and games, chess, exercise classes, aquatic classes and much more) - see the website for details: https://www.[place].gov/departments/parks-recreation/active-adults50  -YouTube has lots of exercise videos for different ages and abilities as well  -[facility] (a variety of indoor and outdoor inperson activities for adults). [phone]. [address]  -Virtual Online Classes (a variety of topics): see seniorplanet.org or call [phone]  -consider volunteering at a school, hospice center, church, senior center or elsewhere

## 2023-08-19 ENCOUNTER — Other Ambulatory Visit: Payer: Self-pay | Admitting: Family Medicine

## 2023-08-19 DIAGNOSIS — I10 Essential (primary) hypertension: Secondary | ICD-10-CM

## 2023-09-03 ENCOUNTER — Other Ambulatory Visit: Payer: Self-pay

## 2023-09-03 ENCOUNTER — Telehealth: Payer: Self-pay | Admitting: *Deleted

## 2023-09-03 ENCOUNTER — Other Ambulatory Visit: Payer: Self-pay | Admitting: Family Medicine

## 2023-09-03 DIAGNOSIS — I10 Essential (primary) hypertension: Secondary | ICD-10-CM

## 2023-09-03 DIAGNOSIS — E785 Hyperlipidemia, unspecified: Secondary | ICD-10-CM

## 2023-09-03 DIAGNOSIS — Z8673 Personal history of transient ischemic attack (TIA), and cerebral infarction without residual deficits: Secondary | ICD-10-CM

## 2023-09-03 DIAGNOSIS — I480 Paroxysmal atrial fibrillation: Secondary | ICD-10-CM

## 2023-09-03 MED ORDER — BISOPROLOL-HYDROCHLOROTHIAZIDE 10-6.25 MG PO TABS: 1.0000 | ORAL_TABLET | Freq: Every day | ORAL | 0 refills | Status: DC

## 2023-09-03 NOTE — Telephone Encounter (Signed)
Rx done. 

## 2023-09-06 ENCOUNTER — Ambulatory Visit: Payer: Medicare PPO | Admitting: Family Medicine

## 2023-09-06 ENCOUNTER — Encounter: Payer: Self-pay | Admitting: Family Medicine

## 2023-09-06 VITALS — BP 116/70 | HR 62 | Temp 97.7°F | Ht 63.0 in | Wt 181.9 lb

## 2023-09-06 DIAGNOSIS — E785 Hyperlipidemia, unspecified: Secondary | ICD-10-CM | POA: Diagnosis not present

## 2023-09-06 DIAGNOSIS — Z23 Encounter for immunization: Secondary | ICD-10-CM | POA: Diagnosis not present

## 2023-09-06 DIAGNOSIS — I10 Essential (primary) hypertension: Secondary | ICD-10-CM | POA: Diagnosis not present

## 2023-09-06 DIAGNOSIS — F419 Anxiety disorder, unspecified: Secondary | ICD-10-CM

## 2023-09-06 DIAGNOSIS — I480 Paroxysmal atrial fibrillation: Secondary | ICD-10-CM | POA: Diagnosis not present

## 2023-09-06 MED ORDER — BISOPROLOL-HYDROCHLOROTHIAZIDE 10-6.25 MG PO TABS
1.0000 | ORAL_TABLET | Freq: Every day | ORAL | 1 refills | Status: DC
Start: 2023-09-06 — End: 2024-01-28

## 2023-09-06 MED ORDER — CLORAZEPATE DIPOTASSIUM 3.75 MG PO TABS
3.7500 mg | ORAL_TABLET | Freq: Two times a day (BID) | ORAL | 1 refills | Status: DC
Start: 2023-09-06 — End: 2024-03-21

## 2023-09-06 NOTE — Progress Notes (Unsigned)
Established Patient Office Visit  Subjective   Patient ID: Tammy Barron, female    DOB: 1941-01-10  Age: 82 y.o. MRN: [id number]  Chief Complaint  Patient presents with   Medication Management    Patient is here for her yearly follow up today on her blood pressure. She reports that she went to the dermatologist for a lesion on her lip, states that she had a procedure to have it removed. States that she hasn't had any changes to her health since last year except for that.   HLD-- pt remains on 10 mg atorvastatin daily. She deneis any muscle cramps of other problems, she is due for her annual bloodwork today.   Chronic anxiety-- pt remains on BID dosing of tranxene. She has been on this medication for many years. States that it continues to work for her to control her anxiety symptoms. We briefly discussed the risk of dementia with long term use of these medications and that there might be other medications that could help. Risks/benefits discussed with patient.     Current Outpatient Medications  Medication Instructions   acetaminophen (TYLENOL) 500 mg, Oral, Every 6 hours PRN   atorvastatin (LIPITOR) 10 MG tablet TAKE 1 TABLET BY MOUTH EVERY DAY AT NIGHT   bisoprolol-hydrochlorothiazide (ZIAC) 10-6.25 MG tablet 1 tablet, Oral, Daily   clorazepate (TRANXENE) 3.75 mg, Oral, 2 times daily   dabigatran (PRADAXA) 150 mg, Oral, 2 times daily   Multiple Vitamin (MULTIVITAMIN) tablet 1 tablet, Daily    Patient Active Problem List   Diagnosis Date Noted   Fall 08/01/2019   H/O: CVA (cerebrovascular accident) 08/01/2019   CKD (chronic kidney disease) stage 2, GFR 60-89 ml/min 08/01/2019   mild Hyponatremia 08/01/2019   Chronic anticoagulation 08/01/2019   Carotid stenosis, right 01/09/2019   Peripheral artery disease (HCC) 01/09/2019   Laboratory examination 01/09/2019   Ataxia, late effect of cerebrovascular disease 03/23/2014   Urination frequency 02/14/2014   Cerebral  infarction (HCC) 02/06/2014   Thrombocytopenia (HCC) 02/06/2014   AF (paroxysmal atrial fibrillation) (HCC) 02/05/2014   Hypernatremia, induced 02/05/2014   Obesity, unspecified 02/05/2014   Tobacco use disorder 02/05/2014   Obstructive hydrocephalus (HCC) 02/01/2014   Cerebellar stroke (HCC) 01/31/2014   ABDOMINAL PAIN 08/22/2009   KNEE PAIN, LEFT, CHRONIC 02/21/2008   Dyslipidemia 12/07/2007   Anxiety 12/07/2007   DEPRESSION 12/07/2007   Essential hypertension 12/07/2007      Review of Systems  All other systems reviewed and are negative.     Objective:     BP 116/70 (BP Location: Left Arm, Patient Position: Sitting, Cuff Size: Large)   Pulse 62   Temp 97.7 F (36.5 C) (Oral)   Ht 5\' 3"  (1.6 m)   Wt 181 lb 14.4 oz (82.5 kg)   SpO2 96%   BMI 32.22 kg/m    Physical Exam Vitals reviewed.  Constitutional:      Appearance: Normal appearance. She is well-groomed. She is obese.  Eyes:     Extraocular Movements: Extraocular movements intact.     Conjunctiva/sclera: Conjunctivae normal.  Cardiovascular:     Rate and Rhythm: Normal rate and regular rhythm.     Pulses: Normal pulses.     Heart sounds: S1 normal and S2 normal.  Pulmonary:     Effort: Pulmonary effort is normal.     Breath sounds: Normal breath sounds and air entry.  Abdominal:     General: Bowel sounds are normal.  Musculoskeletal:  General: Normal range of motion.     Cervical back: Normal range of motion and neck supple.     Right lower leg: No edema.     Left lower leg: No edema.  Skin:    General: Skin is warm and dry.  Neurological:     Mental Status: She is alert and oriented to person, place, and time. Mental status is at baseline.     Gait: Gait is intact.  Psychiatric:        Mood and Affect: Mood and affect normal.        Speech: Speech normal.        Behavior: Behavior normal.        Judgment: Judgment normal.      No results found for any visits on 09/06/23.    The  ASCVD Risk score (Arnett DK, et al., 2019) failed to calculate for the following reasons:   The 2019 ASCVD risk score is only valid for ages 21 to 62   The patient has a prior MI or stroke diagnosis    Assessment & Plan:  AF (paroxysmal atrial fibrillation) [facility]) Assessment & Plan: On blood thinners chronically, HR is regular today. Needs annual CBC for surveillance.   Orders: -     CBC with Differential/Platelet; Future  Anxiety Assessment & Plan: Pt has been on tranxene for many years, at this point the risk of weaning off the medications outweighs the benefits of stopping the medication. Will refilled this for her  Orders: -     Clorazepate Dipotassium; Take 1 tablet (3.75 mg total) by mouth 2 (two) times daily.  Dispense: 180 tablet; Refill: 1  Essential hypertension Assessment & Plan: Current hypertension medications:       Sig   bisoprolol-hydrochlorothiazide (ZIAC) 10-6.25 MG tablet Take 1 tablet by mouth daily.      Chronic, stable, BP is well controlled on the above medications, she is due for her annual bloodwork  Orders: -     Bisoprolol-hydroCHLOROthiazide; Take 1 tablet by mouth daily.  Dispense: 90 tablet; Refill: 1 -     Comprehensive metabolic panel; Future  Dyslipidemia -     Lipid panel; Future  Need for immunization against influenza -     Flu Vaccine Trivalent High Dose (Fluad)     Return in about 6 months (around 03/05/2024) for HTN.    Karie Georges, MD

## 2023-09-07 NOTE — Assessment & Plan Note (Signed)
Pt has been on tranxene for many years, at this point the risk of weaning off the medications outweighs the benefits of stopping the medication. Will refilled this for her

## 2023-09-07 NOTE — Assessment & Plan Note (Signed)
Current hypertension medications:       Sig   bisoprolol-hydrochlorothiazide (ZIAC) 10-6.25 MG tablet Take 1 tablet by mouth daily.      Chronic, stable, BP is well controlled on the above medications, she is due for her annual bloodwork

## 2023-09-07 NOTE — Assessment & Plan Note (Signed)
On blood thinners chronically, HR is regular today. Needs annual CBC for surveillance.

## 2023-09-17 ENCOUNTER — Other Ambulatory Visit: Payer: Medicare PPO

## 2023-10-05 ENCOUNTER — Other Ambulatory Visit: Payer: Medicare PPO

## 2023-11-01 ENCOUNTER — Other Ambulatory Visit: Payer: Medicare PPO

## 2024-01-18 ENCOUNTER — Other Ambulatory Visit: Payer: Self-pay | Admitting: Family Medicine

## 2024-01-18 DIAGNOSIS — I480 Paroxysmal atrial fibrillation: Secondary | ICD-10-CM

## 2024-01-18 DIAGNOSIS — E785 Hyperlipidemia, unspecified: Secondary | ICD-10-CM

## 2024-01-18 DIAGNOSIS — Z8673 Personal history of transient ischemic attack (TIA), and cerebral infarction without residual deficits: Secondary | ICD-10-CM

## 2024-01-28 ENCOUNTER — Other Ambulatory Visit: Payer: Self-pay | Admitting: Family Medicine

## 2024-01-28 DIAGNOSIS — I10 Essential (primary) hypertension: Secondary | ICD-10-CM

## 2024-03-21 ENCOUNTER — Other Ambulatory Visit: Payer: Self-pay | Admitting: Family Medicine

## 2024-03-21 DIAGNOSIS — I480 Paroxysmal atrial fibrillation: Secondary | ICD-10-CM

## 2024-03-21 DIAGNOSIS — E785 Hyperlipidemia, unspecified: Secondary | ICD-10-CM

## 2024-03-21 DIAGNOSIS — Z8673 Personal history of transient ischemic attack (TIA), and cerebral infarction without residual deficits: Secondary | ICD-10-CM

## 2024-03-21 DIAGNOSIS — F419 Anxiety disorder, unspecified: Secondary | ICD-10-CM

## 2024-03-21 NOTE — Telephone Encounter (Signed)
 Left a detailed message at the patient's home number to call the office and schedule an appt as below.

## 2024-03-21 NOTE — Telephone Encounter (Signed)
 Rx printed for Clorazepate  and placed in the red folder.

## 2024-03-21 NOTE — Telephone Encounter (Signed)
 Patient needs appointment for her clorazapate refills, please call her and have her schedule an appointment.

## 2024-03-28 ENCOUNTER — Ambulatory Visit: Admitting: Family Medicine

## 2024-03-28 ENCOUNTER — Other Ambulatory Visit (INDEPENDENT_AMBULATORY_CARE_PROVIDER_SITE_OTHER)

## 2024-03-28 ENCOUNTER — Encounter: Payer: Self-pay | Admitting: Family Medicine

## 2024-03-28 VITALS — BP 136/64 | HR 67 | Temp 98.2°F | Ht 63.0 in

## 2024-03-28 DIAGNOSIS — Z8673 Personal history of transient ischemic attack (TIA), and cerebral infarction without residual deficits: Secondary | ICD-10-CM | POA: Diagnosis not present

## 2024-03-28 DIAGNOSIS — I480 Paroxysmal atrial fibrillation: Secondary | ICD-10-CM | POA: Diagnosis not present

## 2024-03-28 DIAGNOSIS — L570 Actinic keratosis: Secondary | ICD-10-CM | POA: Diagnosis not present

## 2024-03-28 DIAGNOSIS — E785 Hyperlipidemia, unspecified: Secondary | ICD-10-CM

## 2024-03-28 DIAGNOSIS — R2689 Other abnormalities of gait and mobility: Secondary | ICD-10-CM

## 2024-03-28 DIAGNOSIS — I10 Essential (primary) hypertension: Secondary | ICD-10-CM | POA: Diagnosis not present

## 2024-03-28 LAB — CBC WITH DIFFERENTIAL/PLATELET
Basophils Absolute: 0 10*3/uL (ref 0.0–0.1)
Basophils Relative: 0.4 % (ref 0.0–3.0)
Eosinophils Absolute: 0.2 10*3/uL (ref 0.0–0.7)
Eosinophils Relative: 3.1 % (ref 0.0–5.0)
HCT: 42.9 % (ref 36.0–46.0)
Hemoglobin: 14.5 g/dL (ref 12.0–15.0)
Lymphocytes Relative: 25.8 % (ref 12.0–46.0)
Lymphs Abs: 1.5 10*3/uL (ref 0.7–4.0)
MCHC: 33.8 g/dL (ref 30.0–36.0)
MCV: 91.1 fl (ref 78.0–100.0)
Monocytes Absolute: 0.4 10*3/uL (ref 0.1–1.0)
Monocytes Relative: 7.6 % (ref 3.0–12.0)
Neutro Abs: 3.6 10*3/uL (ref 1.4–7.7)
Neutrophils Relative %: 63.1 % (ref 43.0–77.0)
Platelets: 176 10*3/uL (ref 150.0–400.0)
RBC: 4.71 Mil/uL (ref 3.87–5.11)
RDW: 13.3 % (ref 11.5–15.5)
WBC: 5.7 10*3/uL (ref 4.0–10.5)

## 2024-03-28 LAB — COMPREHENSIVE METABOLIC PANEL WITH GFR
ALT: 14 U/L (ref 0–35)
AST: 21 U/L (ref 0–37)
Albumin: 4 g/dL (ref 3.5–5.2)
Alkaline Phosphatase: 50 U/L (ref 39–117)
BUN: 15 mg/dL (ref 6–23)
CO2: 29 meq/L (ref 19–32)
Calcium: 9.2 mg/dL (ref 8.4–10.5)
Chloride: 98 meq/L (ref 96–112)
Creatinine, Ser: 0.85 mg/dL (ref 0.40–1.20)
GFR: 62.68 mL/min (ref >60.00)
Glucose, Bld: 98 mg/dL (ref 70–99)
Potassium: 4.2 meq/L (ref 3.5–5.1)
Sodium: 135 meq/L (ref 135–145)
Total Bilirubin: 0.5 mg/dL (ref 0.2–1.2)
Total Protein: 7.3 g/dL (ref 6.0–8.3)

## 2024-03-28 LAB — LIPID PANEL
Cholesterol: 119 mg/dL (ref 0–200)
HDL: 44.5 mg/dL (ref >39.00)
LDL Cholesterol: 38 mg/dL (ref 0–99)
NonHDL: 74.4
Total CHOL/HDL Ratio: 3
Triglycerides: 182 mg/dL — ABNORMAL HIGH (ref 0.0–149.0)
VLDL: 36.4 mg/dL (ref 0.0–40.0)

## 2024-03-28 MED ORDER — ATORVASTATIN CALCIUM 10 MG PO TABS: ORAL_TABLET | ORAL | 1 refills | Status: DC

## 2024-03-28 MED ORDER — BISOPROLOL-HYDROCHLOROTHIAZIDE 10-6.25 MG PO TABS: 1.0000 | ORAL_TABLET | Freq: Every day | ORAL | 1 refills | Status: DC

## 2024-03-28 NOTE — Progress Notes (Signed)
 Established Patient Office Visit  Subjective   Patient ID: Tammy Barron, female    DOB: 11/14/1940  Age: 83 y.o. MRN: [id number]  Chief Complaint  Patient presents with   Medical Management of Chronic Issues   Rash    Patient complains of a rash along her chest x1 year, denies itching or other symptoms   balance    Patient states she is "unsteady on her feet" x1 year    Pt is here for follow up of her chronic medical conditions and she also has new symptoms to discuss today,  Rash on chest-- pt reports she has several rough raised patches of skin on her chest and other areas. States that it doesn't itch and is not painful, has been there for over 1 year. States she has recently seen the dermatologist (goes to [facility]) and had a skin cancer removed from her upper lip recently removed.   Pt is also reporting balance issues for the last several years. States it has been going on since she fell and broke her hip in 2020. States that she feels like she has a "phobia" of falling again. Also reports that her eyesight is also somewhat worse, hasn't seen her eye doctor since last year. States that she feels secure in her home but has great difficulty leaving home.  HTN -- BP in office performed and is well controlled. She  reports no side effects to the medications, no chest pain, SOB, dizziness or headaches. She has a BP cuff at home and is checking BP regularly, reports they are in the normal range.          03/28/2024   11:21 AM 08/12/2022    1:10 PM 07/30/2021    1:36 PM 11/30/2019    4:19 PM  6CIT Screen  What Year? 0 points 0 points 0 points 0 points  What month? 0 points 0 points 0 points 0 points  What time? 0 points 0 points 0 points 0 points  Count back from 20 0 points 0 points 0 points 0 points  Months in reverse 4 points 0 points 4 points 0 points  Repeat phrase 0 points 0 points 4 points 0 points  Total Score 4 points 0 points 8 points 0 points       Current Outpatient Medications  Medication Instructions   acetaminophen  (TYLENOL ) 500 mg, Every 6 hours PRN   atorvastatin  (LIPITOR) 10 MG tablet TAKE 1 TABLET BY MOUTH EVERY DAY AT NIGHT   bisoprolol -hydrochlorothiazide  (ZIAC ) 10-6.25 MG tablet 1 tablet, Oral, Daily   clorazepate  (TRANXENE ) 3.75 mg, Oral, 2 times daily   dabigatran  (PRADAXA ) 150 mg, Oral, 2 times daily   Multiple Vitamin (MULTIVITAMIN) tablet 1 tablet, Daily    Patient Active Problem List   Diagnosis Date Noted   Fall 08/01/2019   H/O: CVA (cerebrovascular accident) 08/01/2019   CKD (chronic kidney disease) stage 2, GFR 60-89 ml/min 08/01/2019   mild Hyponatremia 08/01/2019   Chronic anticoagulation 08/01/2019   Carotid stenosis, right 01/09/2019   Peripheral artery disease (HCC) 01/09/2019   Laboratory examination 01/09/2019   Ataxia, late effect of cerebrovascular disease 03/23/2014   Urination frequency 02/14/2014   Cerebral infarction (HCC) 02/06/2014   Thrombocytopenia (HCC) 02/06/2014   AF (paroxysmal atrial fibrillation) (HCC) 02/05/2014   Hypernatremia, induced 02/05/2014   Obesity, unspecified 02/05/2014   Tobacco use disorder 02/05/2014   Obstructive hydrocephalus (HCC) 02/01/2014   Cerebellar stroke (HCC) 01/31/2014   ABDOMINAL PAIN 08/22/2009  KNEE PAIN, LEFT, CHRONIC 02/21/2008   Dyslipidemia 12/07/2007   Anxiety 12/07/2007   DEPRESSION 12/07/2007   Essential hypertension 12/07/2007      Review of Systems  All other systems reviewed and are negative.     Objective:     BP 136/64   Pulse 67   Temp 98.2 F (36.8 C) (Oral)   Ht 5\' 3"  (1.6 m)   SpO2 98%   BMI 32.22 kg/m    Physical Exam Vitals reviewed.  Constitutional:      Appearance: Normal appearance. She is well-groomed. She is obese.  Eyes:     Conjunctiva/sclera: Conjunctivae normal.  Neck:     Thyroid : No thyromegaly.  Cardiovascular:     Rate and Rhythm: Normal rate and regular rhythm.     Pulses: Normal  pulses.     Heart sounds: S1 normal and S2 normal.  Pulmonary:     Effort: Pulmonary effort is normal.     Breath sounds: Normal breath sounds and air entry.  Abdominal:     General: Bowel sounds are normal.  Musculoskeletal:     Right lower leg: No edema.     Left lower leg: No edema.  Neurological:     Mental Status: She is alert and oriented to person, place, and time. Mental status is at baseline.     Gait: Gait is intact.  Psychiatric:        Mood and Affect: Mood and affect normal.        Speech: Speech normal.        Behavior: Behavior normal.        Judgment: Judgment normal.       The ASCVD Risk score (Arnett DK, et al., 2019) failed to calculate for the following reasons:   The 2019 ASCVD risk score is only valid for ages 33 to 77   Risk score cannot be calculated because patient has a medical history suggesting prior/existing ASCVD    Assessment & Plan:  Keratosis, actinic   On scattered areas of her chest, I advised that she go back to her dermatologist for evaluation since she is already established with them and she has had cancers removed in the past.   Essential hypertension Assessment & Plan: Current hypertension medications:       Sig   bisoprolol -hydrochlorothiazide  (ZIAC ) 10-6.25 MG tablet Take 1 tablet by mouth daily.      Chronic, stable, BP is well controlled on the above medications, continue medications as prescribed.   Orders: -     Bisoprolol -hydroCHLOROthiazide ; Take 1 tablet by mouth daily.  Dispense: 90 tablet; Refill: 1  Balance disorder With also complaints about her memory. 6CIT was performed today and she got everything except the months counting backwards. I reassured patient that her short term memory appears mostly intact, will continue to check this yearly. I have ordered home health for PT to begin rehab and improve mobility and confidence.   -     Ambulatory referral to Home Health  H/O: CVA (cerebrovascular accident) -      Ambulatory referral to Home Health  Dyslipidemia Assessment & Plan: On atorvastatin  daily, patient just had her labs done this morning so will review them when results are available.  Orders: -     Atorvastatin  Calcium ; TAKE 1 TABLET BY MOUTH EVERY DAY AT NIGHT  Dispense: 90 tablet; Refill: 1     No follow-ups on file.    Aida House, MD

## 2024-03-28 NOTE — Assessment & Plan Note (Signed)
 Current hypertension medications:       Sig   bisoprolol -hydrochlorothiazide  (ZIAC ) 10-6.25 MG tablet Take 1 tablet by mouth daily.      Chronic, stable, BP is well controlled on the above medications, continue medications as prescribed.

## 2024-03-28 NOTE — Patient Instructions (Signed)
 [email or web address]  Is your My Chart username, just select "forgot password" and they will send you a new link to reset your password

## 2024-03-28 NOTE — Assessment & Plan Note (Signed)
 On atorvastatin  daily, patient just had her labs done this morning so will review them when results are available.

## 2024-03-29 ENCOUNTER — Telehealth: Payer: Self-pay | Admitting: *Deleted

## 2024-03-29 NOTE — Telephone Encounter (Signed)
 Copied from CRM [id number]. Topic: Clinical - Medical Advice >> Mar 29, 2024  4:22 PM Keitha Pata L wrote: Reason for CRM: Tammy Barron from [facility] stated that patient will be starting therapy on 06/02

## 2024-03-30 ENCOUNTER — Telehealth: Payer: Self-pay | Admitting: *Deleted

## 2024-03-30 DIAGNOSIS — F419 Anxiety disorder, unspecified: Secondary | ICD-10-CM

## 2024-03-30 NOTE — Telephone Encounter (Signed)
 Copied from CRM [id number]. Topic: Clinical - Prescription Issue >> Mar 30, 2024  2:29 PM Magdalene School wrote: Reason for CRM: Patient calling because she is out of clorazepate  (TRANXENE ) 3.75 MG tablet and just had an appointment on 03/28/24 where she was told the prescription will be sent.

## 2024-03-30 NOTE — Telephone Encounter (Signed)
 Copied from CRM [id number]. Topic: Clinical - Lab/Test Results >> Mar 30, 2024  2:35 PM Magdalene School wrote: Reason for CRM: Patient stated she does not have access to MyChart and would like a call when lab results are available.

## 2024-03-30 NOTE — Telephone Encounter (Signed)
 Copied from CRM [id number]. Topic: Clinical - Medical Advice >> Mar 29, 2024  4:22 PM Keitha Pata L wrote: Reason for CRM: Jonae from [facility] stated that patient will be starting therapy on 06/02 >> Mar 30, 2024  2:33 PM Magdalene School wrote: Patient stated same thing for dabigatran  (PRADAXA ) 150 MG CAPS capsule

## 2024-03-30 NOTE — Telephone Encounter (Signed)
 Ok to close

## 2024-03-31 ENCOUNTER — Ambulatory Visit: Payer: Self-pay | Admitting: Family Medicine

## 2024-03-31 MED ORDER — CLORAZEPATE DIPOTASSIUM 3.75 MG PO TABS: 3.7500 mg | ORAL_TABLET | Freq: Two times a day (BID) | ORAL | 2 refills | Status: DC

## 2024-03-31 NOTE — Progress Notes (Signed)
 Labs are stable from last set -- cholesterol looks great and kidney and liver function is normal

## 2024-03-31 NOTE — Telephone Encounter (Signed)
 I sent the pradaxa  on 5/20 -- did they not get the script?

## 2024-03-31 NOTE — Telephone Encounter (Signed)
 See previous task, ok to close

## 2024-03-31 NOTE — Telephone Encounter (Signed)
 Script sent

## 2024-04-03 ENCOUNTER — Telehealth: Payer: Self-pay | Admitting: *Deleted

## 2024-04-03 NOTE — Telephone Encounter (Signed)
 See prior phone note.

## 2024-04-03 NOTE — Telephone Encounter (Signed)
 Left a detailed message at the patient's home number stating per [facility] Rx for Pradaxa  was filled for a 90-day supply in April so she is not due yet.  Message was left to contact the pharmacy with any further questions or return a call here if needed.

## 2024-04-03 NOTE — Telephone Encounter (Signed)
 Left a message for the patient to return my call.

## 2024-04-03 NOTE — Telephone Encounter (Signed)
 Copied from CRM [id number]. Topic: Clinical - Lab/Test Results >> Apr 03, 2024 12:54 PM Melissa C wrote: Reason for CRM: patient returning office's call about lab results. Since it was a brief message and everything was fine I relayed information to patient. She asked that I let clinic know she was able to get message and if the doctor needs anything else to give her a call.

## 2024-04-03 NOTE — Telephone Encounter (Signed)
 Copied from CRM [id number]. Topic: Clinical - Medication Question >> Apr 03, 2024 12:56 PM Melissa C wrote: Reason for CRM: While on the phone with patient for her lab results patient wanted to ensure that all of her [facility] [address] [facility] visit. I let her know it looked that way on my end but I would send a message to her clinical team to make sure.

## 2024-04-12 DIAGNOSIS — E871 Hypo-osmolality and hyponatremia: Secondary | ICD-10-CM | POA: Diagnosis not present

## 2024-04-12 DIAGNOSIS — N182 Chronic kidney disease, stage 2 (mild): Secondary | ICD-10-CM | POA: Diagnosis not present

## 2024-04-12 DIAGNOSIS — I129 Hypertensive chronic kidney disease with stage 1 through stage 4 chronic kidney disease, or unspecified chronic kidney disease: Secondary | ICD-10-CM | POA: Diagnosis not present

## 2024-04-12 DIAGNOSIS — D696 Thrombocytopenia, unspecified: Secondary | ICD-10-CM | POA: Diagnosis not present

## 2024-04-12 DIAGNOSIS — G8929 Other chronic pain: Secondary | ICD-10-CM | POA: Diagnosis not present

## 2024-04-12 DIAGNOSIS — I69393 Ataxia following cerebral infarction: Secondary | ICD-10-CM | POA: Diagnosis not present

## 2024-04-12 DIAGNOSIS — I6521 Occlusion and stenosis of right carotid artery: Secondary | ICD-10-CM | POA: Diagnosis not present

## 2024-04-12 DIAGNOSIS — L570 Actinic keratosis: Secondary | ICD-10-CM | POA: Diagnosis not present

## 2024-04-12 DIAGNOSIS — M25562 Pain in left knee: Secondary | ICD-10-CM | POA: Diagnosis not present

## 2024-06-20 ENCOUNTER — Other Ambulatory Visit: Payer: Self-pay | Admitting: Family Medicine

## 2024-06-20 DIAGNOSIS — Z8673 Personal history of transient ischemic attack (TIA), and cerebral infarction without residual deficits: Secondary | ICD-10-CM

## 2024-06-20 DIAGNOSIS — I480 Paroxysmal atrial fibrillation: Secondary | ICD-10-CM

## 2024-07-17 ENCOUNTER — Other Ambulatory Visit: Payer: Self-pay | Admitting: Family Medicine

## 2024-07-17 DIAGNOSIS — I480 Paroxysmal atrial fibrillation: Secondary | ICD-10-CM

## 2024-07-17 DIAGNOSIS — Z8673 Personal history of transient ischemic attack (TIA), and cerebral infarction without residual deficits: Secondary | ICD-10-CM

## 2024-08-09 ENCOUNTER — Encounter: Payer: Self-pay | Admitting: Podiatry

## 2024-08-09 ENCOUNTER — Ambulatory Visit: Admitting: Podiatry

## 2024-08-09 DIAGNOSIS — B351 Tinea unguium: Secondary | ICD-10-CM

## 2024-08-09 NOTE — Progress Notes (Signed)
  Subjective:  Patient ID: Tammy Barron, female    DOB: 1941/05/20,   MRN: [id number]  Chief Complaint  Patient presents with   Nail Problem    I have a fungus in these toenails on my left foot.    83 y.o. female presents for concern of nail changes that have been ongoing for years. She has tried some OTC topicals without much change. She was advised by salon to have the nails evaluated.   . Denies any other pedal complaints. Denies n/v/f/c.   Past Medical History:  Diagnosis Date   Anemia    Anxiety    Atrial fibrillation (HCC)    Carotid stenosis, right 01/09/2019   Depression    Hx of abnormal Pap smear    Hyperlipidemia    Hypertension    Peripheral artery disease 01/09/2019    Objective:  Physical Exam: Vascular: DP/PT pulses 2/4 bilateral. CFT <3 seconds. Normal hair growth on digits. No edema.  Skin. No lacerations or abrasions bilateral feet. Nails 1-5 bilateral are thickened and dystrophic with subungual debris. Particularly the left great toenail Musculoskeletal: MMT 5/5 bilateral lower extremities in DF, PF, Inversion and Eversion. Deceased ROM in DF of ankle joint.  Neurological: Sensation intact to light touch.   Assessment:   1. Onychomycosis      Plan:  Patient was evaluated and treated and all questions answered. -Examined patient -Discussed treatment options for painful dystrophic nails  -Clinical picture and Fungal culture was obtained by removing a portion of the hard nail itself from each of the involved toenails using a sterile nail nipper and sent to [facility] lab. Patient tolerated the biopsy procedure well without discomfort or need for anesthesia.  -Discussed fungal nail treatment options including oral, topical, and laser treatments.  -Patient to return in 4 weeks for follow up evaluation and discussion of fungal culture results or sooner if symptoms worsen.   Asberry Failing, DPM

## 2024-08-09 NOTE — Addendum Note (Signed)
 Addended by: WAYLAN ELIDIA PARAS on: 08/09/2024 02:42 PM   Modules accepted: Orders

## 2024-08-18 ENCOUNTER — Other Ambulatory Visit: Payer: Self-pay | Admitting: Family Medicine

## 2024-08-18 DIAGNOSIS — I480 Paroxysmal atrial fibrillation: Secondary | ICD-10-CM

## 2024-08-18 DIAGNOSIS — Z8673 Personal history of transient ischemic attack (TIA), and cerebral infarction without residual deficits: Secondary | ICD-10-CM

## 2024-09-06 ENCOUNTER — Ambulatory Visit (INDEPENDENT_AMBULATORY_CARE_PROVIDER_SITE_OTHER): Admitting: Podiatry

## 2024-09-06 DIAGNOSIS — Z91199 Patient's noncompliance with other medical treatment and regimen due to unspecified reason: Secondary | ICD-10-CM

## 2024-09-06 NOTE — Progress Notes (Signed)
 No show

## 2024-09-22 ENCOUNTER — Other Ambulatory Visit: Payer: Self-pay | Admitting: Family Medicine

## 2024-09-22 DIAGNOSIS — F419 Anxiety disorder, unspecified: Secondary | ICD-10-CM

## 2024-09-22 DIAGNOSIS — I480 Paroxysmal atrial fibrillation: Secondary | ICD-10-CM

## 2024-09-22 DIAGNOSIS — Z8673 Personal history of transient ischemic attack (TIA), and cerebral infarction without residual deficits: Secondary | ICD-10-CM

## 2024-09-25 ENCOUNTER — Ambulatory Visit

## 2024-10-25 ENCOUNTER — Other Ambulatory Visit: Payer: Self-pay | Admitting: Family Medicine

## 2024-10-25 DIAGNOSIS — Z8673 Personal history of transient ischemic attack (TIA), and cerebral infarction without residual deficits: Secondary | ICD-10-CM

## 2024-10-25 DIAGNOSIS — I480 Paroxysmal atrial fibrillation: Secondary | ICD-10-CM

## 2024-10-31 ENCOUNTER — Other Ambulatory Visit: Payer: Self-pay | Admitting: Family Medicine

## 2024-10-31 DIAGNOSIS — I480 Paroxysmal atrial fibrillation: Secondary | ICD-10-CM

## 2024-10-31 DIAGNOSIS — Z8673 Personal history of transient ischemic attack (TIA), and cerebral infarction without residual deficits: Secondary | ICD-10-CM

## 2024-10-31 DIAGNOSIS — I10 Essential (primary) hypertension: Secondary | ICD-10-CM

## 2024-10-31 NOTE — Telephone Encounter (Unsigned)
 Copied from CRM #[id number]. Topic: Clinical - Medication Refill >> Oct 31, 2024 10:27 AM Zebedee SAUNDERS wrote: Medication: bisoprolol -hydrochlorothiazide  (ZIAC ) 10-6.25 MG tablet, dabigatran  (PRADAXA ) 150 MG CAPS capsule  Has the patient contacted their pharmacy? Yes (Agent: If no, request that the patient contact the pharmacy for the refill. If patient does not wish to contact the pharmacy document the reason why and proceed with request.) (Agent: If yes, when and what did the pharmacy advise?)  This is the patient's preferred pharmacy:  [id number] [address] [facility] - [address] [place] [address] Phone: [phone] Fax: [phone]  Is this the correct pharmacy for this prescription? Yes If no, delete pharmacy and type the correct one.   Has the prescription been filled recently? Yes  Is the patient out of the medication? Yes  Has the patient been seen for an appointment in the last year OR does the patient have an upcoming appointment? Yes  Can we respond through MyChart? Yes  Agent: Please be advised that Rx refills may take up to 3 business days. We ask that you follow-up with your pharmacy.

## 2024-11-01 MED ORDER — BISOPROLOL-HYDROCHLOROTHIAZIDE 10-6.25 MG PO TABS: 1.0000 | ORAL_TABLET | Freq: Every day | ORAL | 0 refills | Status: DC

## 2024-11-01 MED ORDER — DABIGATRAN ETEXILATE MESYLATE 150 MG PO CAPS: 150.0000 mg | ORAL_CAPSULE | Freq: Two times a day (BID) | ORAL | 0 refills | Status: AC

## 2024-11-21 ENCOUNTER — Other Ambulatory Visit: Payer: Self-pay | Admitting: Family Medicine

## 2024-11-21 DIAGNOSIS — E785 Hyperlipidemia, unspecified: Secondary | ICD-10-CM

## 2024-11-25 ENCOUNTER — Other Ambulatory Visit: Payer: Self-pay | Admitting: Family Medicine

## 2024-11-25 DIAGNOSIS — I10 Essential (primary) hypertension: Secondary | ICD-10-CM
